# Patient Record
Sex: Male | Born: 1975 | Race: White | Hispanic: No | Marital: Single | Smoking: Never smoker
Health system: Southern US, Community
[De-identification: ages and names within clinical notes are randomized; demographics above are authoritative.]

## PROBLEM LIST (undated history)

## (undated) DIAGNOSIS — I8500 Esophageal varices without bleeding: Secondary | ICD-10-CM

## (undated) DIAGNOSIS — K319 Disease of stomach and duodenum, unspecified: Secondary | ICD-10-CM

## (undated) DIAGNOSIS — K7030 Alcoholic cirrhosis of liver without ascites: Secondary | ICD-10-CM

## (undated) DIAGNOSIS — F1010 Alcohol abuse, uncomplicated: Secondary | ICD-10-CM

## (undated) DIAGNOSIS — E119 Type 2 diabetes mellitus without complications: Secondary | ICD-10-CM

## (undated) DIAGNOSIS — I10 Essential (primary) hypertension: Secondary | ICD-10-CM

---

## 2014-03-19 ENCOUNTER — Encounter (HOSPITAL_COMMUNITY): Payer: Self-pay | Admitting: Emergency Medicine

## 2014-03-19 ENCOUNTER — Emergency Department (HOSPITAL_COMMUNITY): Payer: BC Managed Care – PPO

## 2014-03-19 ENCOUNTER — Inpatient Hospital Stay (HOSPITAL_COMMUNITY)
Admission: EM | Admit: 2014-03-19 | Discharge: 2014-03-21 | DRG: 811 | Disposition: A | Discharge status: Home / Self Care | Payer: BC Managed Care – PPO | Attending: Internal Medicine | Admitting: Internal Medicine

## 2014-03-19 DIAGNOSIS — I129 Hypertensive chronic kidney disease with stage 1 through stage 4 chronic kidney disease, or unspecified chronic kidney disease: Secondary | ICD-10-CM | POA: Diagnosis present

## 2014-03-19 DIAGNOSIS — I8500 Esophageal varices without bleeding: Secondary | ICD-10-CM | POA: Diagnosis present

## 2014-03-19 DIAGNOSIS — D62 Acute posthemorrhagic anemia: Principal | ICD-10-CM | POA: Diagnosis present

## 2014-03-19 DIAGNOSIS — K766 Portal hypertension: Secondary | ICD-10-CM | POA: Diagnosis present

## 2014-03-19 DIAGNOSIS — R7402 Elevation of levels of lactic acid dehydrogenase (LDH): Secondary | ICD-10-CM | POA: Diagnosis present

## 2014-03-19 DIAGNOSIS — D638 Anemia in other chronic diseases classified elsewhere: Secondary | ICD-10-CM

## 2014-03-19 DIAGNOSIS — F1020 Alcohol dependence, uncomplicated: Secondary | ICD-10-CM | POA: Diagnosis present

## 2014-03-19 DIAGNOSIS — N179 Acute kidney failure, unspecified: Secondary | ICD-10-CM | POA: Diagnosis present

## 2014-03-19 DIAGNOSIS — D649 Anemia, unspecified: Secondary | ICD-10-CM | POA: Diagnosis present

## 2014-03-19 DIAGNOSIS — K2990 Gastroduodenitis, unspecified, without bleeding: Secondary | ICD-10-CM

## 2014-03-19 DIAGNOSIS — K7010 Alcoholic hepatitis without ascites: Secondary | ICD-10-CM | POA: Diagnosis present

## 2014-03-19 DIAGNOSIS — K7460 Unspecified cirrhosis of liver: Secondary | ICD-10-CM | POA: Diagnosis present

## 2014-03-19 DIAGNOSIS — K2970 Gastritis, unspecified, without bleeding: Secondary | ICD-10-CM | POA: Diagnosis present

## 2014-03-19 DIAGNOSIS — R7401 Elevation of levels of liver transaminase levels: Secondary | ICD-10-CM | POA: Diagnosis present

## 2014-03-19 DIAGNOSIS — F1010 Alcohol abuse, uncomplicated: Secondary | ICD-10-CM

## 2014-03-19 DIAGNOSIS — K319 Disease of stomach and duodenum, unspecified: Secondary | ICD-10-CM | POA: Diagnosis present

## 2014-03-19 DIAGNOSIS — Z794 Long term (current) use of insulin: Secondary | ICD-10-CM

## 2014-03-19 DIAGNOSIS — E43 Unspecified severe protein-calorie malnutrition: Secondary | ICD-10-CM | POA: Diagnosis present

## 2014-03-19 DIAGNOSIS — I10 Essential (primary) hypertension: Secondary | ICD-10-CM

## 2014-03-19 DIAGNOSIS — E860 Dehydration: Secondary | ICD-10-CM | POA: Diagnosis present

## 2014-03-19 DIAGNOSIS — E119 Type 2 diabetes mellitus without complications: Secondary | ICD-10-CM | POA: Diagnosis present

## 2014-03-19 DIAGNOSIS — D500 Iron deficiency anemia secondary to blood loss (chronic): Secondary | ICD-10-CM

## 2014-03-19 DIAGNOSIS — R740 Nonspecific elevation of levels of transaminase and lactic acid dehydrogenase [LDH]: Secondary | ICD-10-CM

## 2014-03-19 DIAGNOSIS — N189 Chronic kidney disease, unspecified: Secondary | ICD-10-CM | POA: Diagnosis present

## 2014-03-19 DIAGNOSIS — R112 Nausea with vomiting, unspecified: Secondary | ICD-10-CM | POA: Diagnosis not present

## 2014-03-19 DIAGNOSIS — K7030 Alcoholic cirrhosis of liver without ascites: Secondary | ICD-10-CM

## 2014-03-19 DIAGNOSIS — K449 Diaphragmatic hernia without obstruction or gangrene: Secondary | ICD-10-CM | POA: Diagnosis present

## 2014-03-19 DIAGNOSIS — Z79899 Other long term (current) drug therapy: Secondary | ICD-10-CM

## 2014-03-19 DIAGNOSIS — N19 Unspecified kidney failure: Secondary | ICD-10-CM

## 2014-03-19 HISTORY — DX: Type 2 diabetes mellitus without complications: E11.9

## 2014-03-19 HISTORY — DX: Essential (primary) hypertension: I10

## 2014-03-19 LAB — POC OCCULT BLOOD, ED: FECAL OCCULT BLD: POSITIVE — AB

## 2014-03-19 LAB — CBG MONITORING, ED: GLUCOSE-CAPILLARY: 316 mg/dL — AB (ref 70–99)

## 2014-03-19 LAB — RAPID URINE DRUG SCREEN, HOSP PERFORMED
Amphetamines: NOT DETECTED
BARBITURATES: NOT DETECTED
BENZODIAZEPINES: POSITIVE — AB
Cocaine: NOT DETECTED
Opiates: NOT DETECTED
Tetrahydrocannabinol: NOT DETECTED

## 2014-03-19 LAB — COMPREHENSIVE METABOLIC PANEL
ALK PHOS: 230 U/L — AB (ref 39–117)
ALT: 34 U/L (ref 0–53)
AST: 153 U/L — AB (ref 0–37)
Albumin: 3.5 g/dL (ref 3.5–5.2)
Anion gap: 18 — ABNORMAL HIGH (ref 5–15)
BUN: 19 mg/dL (ref 6–23)
CO2: 21 mEq/L (ref 19–32)
Calcium: 9.3 mg/dL (ref 8.4–10.5)
Chloride: 92 mEq/L — ABNORMAL LOW (ref 96–112)
Creatinine, Ser: 1.95 mg/dL — ABNORMAL HIGH (ref 0.50–1.35)
GFR calc non Af Amer: 42 mL/min — ABNORMAL LOW (ref >90)
GFR, EST AFRICAN AMERICAN: 49 mL/min — AB (ref >90)
GLUCOSE: 316 mg/dL — AB (ref 70–99)
Potassium: 4.3 mEq/L (ref 3.7–5.3)
Sodium: 131 mEq/L — ABNORMAL LOW (ref 137–147)
TOTAL PROTEIN: 7.9 g/dL (ref 6.0–8.3)
Total Bilirubin: 2.1 mg/dL — ABNORMAL HIGH (ref 0.3–1.2)

## 2014-03-19 LAB — CBC
HCT: 21.9 % — ABNORMAL LOW (ref 39.0–52.0)
HEMOGLOBIN: 6.9 g/dL — AB (ref 13.0–17.0)
MCH: 29.6 pg (ref 26.0–34.0)
MCHC: 31.5 g/dL (ref 30.0–36.0)
MCV: 94 fL (ref 78.0–100.0)
Platelets: 144 10*3/uL — ABNORMAL LOW (ref 150–400)
RBC: 2.33 MIL/uL — AB (ref 4.22–5.81)
RDW: 15.5 % (ref 11.5–15.5)
WBC: 9.8 10*3/uL (ref 4.0–10.5)

## 2014-03-19 LAB — GLUCOSE, CAPILLARY
GLUCOSE-CAPILLARY: 230 mg/dL — AB (ref 70–99)
Glucose-Capillary: 232 mg/dL — ABNORMAL HIGH (ref 70–99)

## 2014-03-19 LAB — HEMOGLOBIN AND HEMATOCRIT, BLOOD
HCT: 19.7 % — ABNORMAL LOW (ref 39.0–52.0)
Hemoglobin: 6.4 g/dL — CL (ref 13.0–17.0)

## 2014-03-19 LAB — LIPASE, BLOOD: LIPASE: 90 U/L — AB (ref 11–59)

## 2014-03-19 LAB — ABO/RH: ABO/RH(D): O POS

## 2014-03-19 LAB — PREPARE RBC (CROSSMATCH)

## 2014-03-19 LAB — ETHANOL

## 2014-03-19 MED ORDER — LORAZEPAM 1 MG PO TABS: 1.0000 mg | ORAL_TABLET | Freq: Four times a day (QID) | ORAL | Status: DC | PRN

## 2014-03-19 MED ORDER — VITAMIN B-1 100 MG PO TABS
100.0000 mg | ORAL_TABLET | Freq: Every day | ORAL | Status: DC
  Administered 2014-03-19 – 2014-03-21 (×3): 100 mg via ORAL
  Filled 2014-03-19 (×3): qty 1

## 2014-03-19 MED ORDER — LORAZEPAM 1 MG PO TABS
1.0000 mg | ORAL_TABLET | Freq: Four times a day (QID) | ORAL | Status: DC | PRN
Start: 2014-03-19 — End: 2014-03-21

## 2014-03-19 MED ORDER — INSULIN ASPART 100 UNIT/ML ~~LOC~~ SOLN
0.0000 [IU] | SUBCUTANEOUS | Status: DC
  Administered 2014-03-20: 3 [IU] via SUBCUTANEOUS
  Administered 2014-03-20: 5 [IU] via SUBCUTANEOUS
  Administered 2014-03-20 – 2014-03-21 (×4): 3 [IU] via SUBCUTANEOUS

## 2014-03-19 MED ORDER — FUROSEMIDE 10 MG/ML IJ SOLN
20.0000 mg | Freq: Once | INTRAMUSCULAR | Status: AC
  Administered 2014-03-20: 20 mg via INTRAVENOUS
  Filled 2014-03-19: qty 2

## 2014-03-19 MED ORDER — METFORMIN HCL 500 MG PO TABS: 500.0000 mg | ORAL_TABLET | Freq: Two times a day (BID) | ORAL | Status: DC

## 2014-03-19 MED ORDER — VITAMIN D (ERGOCALCIFEROL) 1.25 MG (50000 UNIT) PO CAPS: 50000.0000 [IU] | ORAL_CAPSULE | ORAL | Status: DC

## 2014-03-19 MED ORDER — ONDANSETRON HCL 4 MG/2ML IJ SOLN: 4.0000 mg | Freq: Four times a day (QID) | INTRAMUSCULAR | Status: DC | PRN

## 2014-03-19 MED ORDER — FOLIC ACID 1 MG PO TABS
1.0000 mg | ORAL_TABLET | Freq: Every day | ORAL | Status: DC
  Administered 2014-03-19 – 2014-03-21 (×3): 1 mg via ORAL
  Filled 2014-03-19 (×3): qty 1

## 2014-03-19 MED ORDER — LORAZEPAM 2 MG/ML IJ SOLN: 0.0000 mg | Freq: Two times a day (BID) | INTRAMUSCULAR | Status: DC

## 2014-03-19 MED ORDER — ALBUTEROL SULFATE (2.5 MG/3ML) 0.083% IN NEBU: 2.5000 mg | INHALATION_SOLUTION | Freq: Four times a day (QID) | RESPIRATORY_TRACT | Status: DC | PRN

## 2014-03-19 MED ORDER — SODIUM CHLORIDE 0.9 % IJ SOLN: 3.0000 mL | Freq: Two times a day (BID) | INTRAMUSCULAR | Status: DC

## 2014-03-19 MED ORDER — SODIUM CHLORIDE 0.9 % IV BOLUS (SEPSIS)
1000.0000 mL | Freq: Once | INTRAVENOUS | Status: AC
  Administered 2014-03-19: 1000 mL via INTRAVENOUS

## 2014-03-19 MED ORDER — MAGNESIUM OXIDE 400 (241.3 MG) MG PO TABS
400.0000 mg | ORAL_TABLET | Freq: Two times a day (BID) | ORAL | Status: DC
  Administered 2014-03-19 – 2014-03-21 (×4): 400 mg via ORAL
  Filled 2014-03-19 (×5): qty 1

## 2014-03-19 MED ORDER — ALBUTEROL SULFATE HFA 108 (90 BASE) MCG/ACT IN AERS: 1.0000 | INHALATION_SPRAY | Freq: Four times a day (QID) | RESPIRATORY_TRACT | Status: DC | PRN

## 2014-03-19 MED ORDER — INSULIN GLARGINE 100 UNIT/ML ~~LOC~~ SOLN
15.0000 [IU] | Freq: Every day | SUBCUTANEOUS | Status: DC
  Filled 2014-03-19 (×2): qty 0.15

## 2014-03-19 MED ORDER — LORAZEPAM 2 MG/ML IJ SOLN: 1.0000 mg | Freq: Four times a day (QID) | INTRAMUSCULAR | Status: DC | PRN

## 2014-03-19 MED ORDER — ONDANSETRON HCL 4 MG PO TABS: 4.0000 mg | ORAL_TABLET | Freq: Four times a day (QID) | ORAL | Status: DC | PRN

## 2014-03-19 MED ORDER — LOSARTAN POTASSIUM 50 MG PO TABS
50.0000 mg | ORAL_TABLET | Freq: Every day | ORAL | Status: DC
  Filled 2014-03-19: qty 1

## 2014-03-19 MED ORDER — ADULT MULTIVITAMIN W/MINERALS CH
1.0000 | ORAL_TABLET | Freq: Every day | ORAL | Status: DC
  Administered 2014-03-19 – 2014-03-21 (×3): 1 via ORAL
  Filled 2014-03-19 (×3): qty 1

## 2014-03-19 MED ORDER — THIAMINE HCL 100 MG/ML IJ SOLN
100.0000 mg | Freq: Every day | INTRAMUSCULAR | Status: DC
  Filled 2014-03-19 (×3): qty 1

## 2014-03-19 MED ORDER — LORAZEPAM 2 MG/ML IJ SOLN: 0.0000 mg | Freq: Four times a day (QID) | INTRAMUSCULAR | Status: DC

## 2014-03-19 MED ORDER — PANTOPRAZOLE SODIUM 40 MG IV SOLR
40.0000 mg | Freq: Once | INTRAVENOUS | Status: AC
  Administered 2014-03-19: 40 mg via INTRAVENOUS
  Filled 2014-03-19: qty 40

## 2014-03-19 MED ORDER — ONDANSETRON HCL 4 MG/2ML IJ SOLN: 4.0000 mg | Freq: Once | INTRAMUSCULAR | Status: DC

## 2014-03-19 MED ORDER — SODIUM CHLORIDE 0.9 % IV SOLN
INTRAVENOUS | Status: DC
  Administered 2014-03-19: 22:00:00 via INTRAVENOUS
  Administered 2014-03-20: 500 mL via INTRAVENOUS
  Administered 2014-03-20 – 2014-03-21 (×2): via INTRAVENOUS

## 2014-03-19 NOTE — ED Notes (Signed)
MD aware CBG 316.

## 2014-03-19 NOTE — H&P (Signed)
[facility] History and Physical  [name] ZOX:[id number] DOB: 04-29-1976 DOA: 03/19/2014  Referring physician: Tresa Res, MD PCP: No PCP Per Patient   Chief Complaint: Anemia  HPI: Jerome [name] is a 38 y.o. male with chronic alcohol abuse [facility]. Patient states that he had been again on a drinking binge and decided that he wanted to go in for detox. Patient states that [facility] and was found to have a low hemoglobin. Patient states that his last drink was yesterday and he normally was drinking about a pint of Vodka daily. Patient states that he has had some diarrhea also. He states that his stool was dark but not frankly bloody. Patient noted that he had no hematemesis noted. He states in the past he has had a Endoscopy done and this has shown gastritis as well as esophageal varices. Patient states that he has noted some increased abdominal girth also.   Review of Systems:  Constitutional:  No weight loss, night sweats, Fevers HEENT:  No headaches, Difficulty swallowing,nasal congestion, post nasal drip,  Cardio-vascular:  No chest pain, Orthopnea, PND, swelling in lower extremities, dizziness, palpitations  GI:  No heartburn, indigestion, abdominal pain, nausea, vomiting, ++diarrhea, no change in bowel habits, loss of appetite  Resp:  No shortness of breath with exertion or at rest. No excess mucus, no productive cough, No non-productive cough Skin:  no rash or lesions.  GU:  no dysuria, change in color of urine, no urgency or frequency. No flank pain.  Musculoskeletal:  No joint pain or swelling. No decreased range of motion. No back pain.  Psych:  No change in mood or affect. No depression or anxiety. No memory loss.   Past Medical History  Diagnosis Date  . Diabetes mellitus without complication   . Hypertension    History reviewed. No pertinent past surgical history. Social History:  reports that he has  never smoked. He does not have any smokeless tobacco history on file. He reports that he drinks alcohol. He reports that he does not use illicit drugs.  No Known Allergies  No family history on file.   Prior to Admission medications   Medication Sig Start Date End Date Taking? Authorizing Provider  albuterol (PROVENTIL HFA;VENTOLIN HFA) 108 (90 BASE) MCG/ACT inhaler Inhale 1-2 puffs into the lungs every 6 (six) hours as needed for wheezing or shortness of breath (shortness of breath).   Yes Historical Provider, MD  insulin glargine (LANTUS) 100 UNIT/ML injection Inject 15 Units into the skin at bedtime.   Yes Historical Provider, MD  LORazepam (ATIVAN) 1 MG tablet Take 1 mg by mouth every 6 (six) hours as needed for anxiety (anxiety).   Yes Historical Provider, MD  losartan (COZAAR) 50 MG tablet Take 50 mg by mouth daily.   Yes Historical Provider, MD  magnesium oxide (MAG-OX) 400 MG tablet Take 400 mg by mouth 2 (two) times daily.   Yes Historical Provider, MD  metFORMIN (GLUCOPHAGE) 500 MG tablet Take 500 mg by mouth 2 (two) times daily with a meal.   Yes Historical Provider, MD  Vitamin D, Ergocalciferol, (DRISDOL) 50000 UNITS CAPS capsule Take 50,000 Units by mouth every 7 (seven) days. Wednesday   Yes Historical Provider, MD   Physical Exam: Filed Vitals:   03/19/14 1800 03/19/14 1830 03/19/14 1900 03/19/14 1937  BP: 111/49 122/36 110/51 112/47  Pulse: 103 98 103 106  Temp:    98.4 F (36.9 C)  TempSrc:  Oral  Resp:    19  SpO2: 96% 98% 100% 99%    Wt Readings from Last 3 Encounters:  No data found for Wt    General:  Appears calm and comfortable Eyes: PERRL, normal lids, irises & conjunctiva ENT: grossly normal hearing, lips & tongue Neck: no LAD, masses or thyromegaly Cardiovascular: RRR, no m/r/g. No LE edema. Respiratory: CTA bilaterally, no w/r/r. Normal respiratory effort. Abdomen: soft, distended probable ascites ++veins on abdominal and chest wall Skin: spider  naevi noted Musculoskeletal: grossly normal tone BUE/BLE Psychiatric: grossly normal mood and affect, speech fluent and appropriate Neurologic: grossly non-focal.          Labs on Admission:  Basic Metabolic Panel:  Recent Labs Lab 03/19/14 1612  NA 131*  K 4.3  CL 92*  CO2 21  GLUCOSE 316*  BUN 19  CREATININE 1.95*  CALCIUM 9.3   Liver Function Tests:  Recent Labs Lab 03/19/14 1612  AST 153*  ALT 34  ALKPHOS 230*  BILITOT 2.1*  PROT 7.9  ALBUMIN 3.5    Recent Labs Lab 03/19/14 1744  LIPASE 90*   No results found for this basename: AMMONIA,  in the last 168 hours CBC:  Recent Labs Lab 03/19/14 1612  WBC 9.8  HGB 6.9*  HCT 21.9*  MCV 94.0  PLT 144*   Cardiac Enzymes: No results found for this basename: CKTOTAL, CKMB, CKMBINDEX, TROPONINI,  in the last 168 hours  BNP (last 3 results) No results found for this basename: PROBNP,  in the last 8760 hours CBG:  Recent Labs Lab 03/19/14 1708  GLUCAP 316*    Radiological Exams on Admission: US Abdomen Complete  03/19/2014   CLINICAL DATA:  Abnormal hepatic function studies ; history of alcohol abuse  EXAM: ULTRASOUND ABDOMEN COMPLETE  COMPARISON:  None.  FINDINGS: Gallbladder:  The gallbladder is contracted and contains echogenic material consistent with sludge. The patient's most recent meal was approximately 6 hr ago. There is no positive sonographic Murphy's sign. There is no gallbladder wall thickening or pericholecystic fluid.  Common bile duct:  Diameter: 5 mm  Liver:  The echotexture of the liver is mildly increased. There is no focal mass or ductal dilation.  IVC:  No abnormality visualized.  Pancreas:  Visualized portion unremarkable.  Spleen:  There is mild splenomegaly. The splenic volume is approximately 660 cc  Right Kidney:  Length: 12.5 cm. Echogenicity within normal limits. No mass or hydronephrosis visualized.  Left Kidney:  Length: 12.9 cm. Echogenicity within normal limits. No mass or  hydronephrosis visualized.  Abdominal aorta:  Bowel gas obscured the abdominal aorta with exception of the proximal portion which measured 1.8 cm in diameter.  Other findings:  There is no ascites.  IMPRESSION: 1. The gallbladder is contracted and contains sludge but no discrete stones. There is no sonographic evidence of acute cholecystitis. Chronic cholecystitis is not excluded. 2. There is likely fatty infiltrative change of the liver. 3. There is splenomegaly which may be related to patent to hepatocellular dysfunction.   Electronically Signed   By: David  [place]   On: 03/19/2014 19:10     Assessment/Plan Principal Problem:   Alcohol abuse Active Problems:   Diabetes mellitus   Hypertension   Anemia   1. Alcohol Abuse recurring episodes -had a lengthy discussion with the patient regarding his ongoing alcohol abuse -he states that he is finally ready to quit and detox -will monitor for DT withdrawal as he states he has had this in  the past -last drink was yesterday  2. Anemia with history of Esophageal varices -obvious concern is if there is a potential for opening up a variceal bleed -he has had gastritis before and likely is chronic to explain his anemia -type and screen he is hemodynamically stable presently -GI consult ordered -will get iron studies  3. Diabetes Mellitus -will continue with his insulin -monitor CBG -check A1C  4. Hypertension -continue with home medications  5. Cirrhosis of the liver -appears to be advance with clinical portal hypertension signs -GI consult -abdominal ultrasound for possible ascites  6. Elevated creatinine -likely dehydrated -will repeat in am -hydrate as tolerated  7. Transaminitis -likely related to cirrhosis and active drinking -will monitor labs -hepatitis panel   Code Status: Full Code (must indicate code status--if unknown or must be presumed, indicate so) DVT Prophylaxis:SCDs Family Communication: None (indicate  person spoken with, if applicable, with phone number if by telephone) Disposition Plan: Home (indicate anticipated LOS)  Time spent: 70min  [facility] HospitalKHAN,Hykeem Ojeda A [facility] Pager [phone]  **Disclaimer: This note may have been dictated with voice recognition software. Similar sounding words can inadvertently be transcribed and this note may contain transcription errors which may not have been corrected upon publication of note.**

## 2014-03-19 NOTE — ED Notes (Signed)
Pt. Unable to urinate at this time. Will collect when pt. Is ready. Nurse was notified.

## 2014-03-19 NOTE — Progress Notes (Signed)
Pt transported to 4th floor via stretcher and ambulated to bed with little assistance. Belongings at bedside. VSS at this time. No distress noted. Will continue to monitor pt closely. Mardene CelesteAsaro, Reshad Saab I

## 2014-03-19 NOTE — ED Notes (Signed)
[facility] In[facility] Fermoina[facility]. Aware of patient's admission to hospital. Will bring belongings to hospital for pt. Pt also aware.

## 2014-03-19 NOTE — ED Notes (Addendum)
Pt reports [facility] that shows Hgb 6.7, RBC 2.27. St[facility], Hgb same then, no treatment performed. Reports stool is dark but no frank blood x24 hours. +diarrhea. [facility], normally drinks pint to a fifth of liquor daily. Last drink yesterday noon.

## 2014-03-19 NOTE — ED Provider Notes (Signed)
CSN: [id number]     Arrival date & time 03/19/14  1511 History   First MD Initiated Contact with Patient 03/19/14 1704     Chief Complaint  Patient presents with  . Anemia  . Diarrhea     (Consider location/radiation/quality/duration/timing/severity/associated sxs/prior Treatment) HPI  This is a 3 are old male with a history of alcohol abuse, diabetes, and hypertension who presents from [facility] with a low hemoglobin.  Patient reports that he presented there yesterday for alcohol detox. He drinks approximately 1 pint of liquor per day. Last drink was yesterday at noon. Patient states that since that time he has had diarrhea. He denies any blood. Basic labwork on him this morning and noted him to have a hemoglobin of 6.7. Patient states that he was told recently he did have a low hemoglobin but "my workup was negative."  He denies any dizziness or shortness of breath. Currently he is only complaining of nausea and diarrhea. He feels these are related to withdrawal.  Past Medical History  Diagnosis Date  . Diabetes mellitus without complication   . Hypertension    History reviewed. No pertinent past surgical history. No family history on file. History  Substance Use Topics  . Smoking status: Never Smoker   . Smokeless tobacco: Not on file  . Alcohol Use: Yes     Comment: pint daily-in rehab as of yesterday (03/18/14)    Review of Systems  Constitutional: Negative.  Negative for fever.  Respiratory: Negative.  Negative for chest tightness and shortness of breath.   Cardiovascular: Negative.  Negative for chest pain.  Gastrointestinal: Positive for nausea and diarrhea. Negative for vomiting, abdominal pain and constipation.  Genitourinary: Negative.  Negative for dysuria.  Musculoskeletal: Negative for back pain.  Skin: Negative for rash.  Neurological: Negative for dizziness, light-headedness and headaches.  All other systems reviewed and are negative.     Allergies   Review of patient's allergies indicates no known allergies.  Home Medications   Prior to Admission medications   Medication Sig Start Date End Date Taking? Authorizing Provider  albuterol (PROVENTIL HFA;VENTOLIN HFA) 108 (90 BASE) MCG/ACT inhaler Inhale 1-2 puffs into the lungs every 6 (six) hours as needed for wheezing or shortness of breath (shortness of breath).   Yes Historical Provider, MD  insulin glargine (LANTUS) 100 UNIT/ML injection Inject 15 Units into the skin at bedtime.   Yes Historical Provider, MD  LORazepam (ATIVAN) 1 MG tablet Take 1 mg by mouth every 6 (six) hours as needed for anxiety (anxiety).   Yes Historical Provider, MD  losartan (COZAAR) 50 MG tablet Take 50 mg by mouth daily.   Yes Historical Provider, MD  magnesium oxide (MAG-OX) 400 MG tablet Take 400 mg by mouth 2 (two) times daily.   Yes Historical Provider, MD  metFORMIN (GLUCOPHAGE) 500 MG tablet Take 500 mg by mouth 2 (two) times daily with a meal.   Yes Historical Provider, MD  Vitamin D, Ergocalciferol, (DRISDOL) 50000 UNITS CAPS capsule Take 50,000 Units by mouth every 7 (seven) days. Wednesday   Yes Historical Provider, MD   BP 112/38  Pulse 101  Temp(Src) 98.4 F (36.9 C) (Oral)  Resp 14  SpO2 97% Physical Exam  Nursing note and vitals reviewed. Constitutional: He is oriented to person, place, and time. He appears well-developed and well-nourished. No distress.  HENT:  Head: Normocephalic and atraumatic.  Eyes: Pupils are equal, round, and reactive to light.  Neck: Neck supple.  Cardiovascular: Regular rhythm  and normal heart sounds.   No murmur heard. Tachycardia  Pulmonary/Chest: Effort normal and breath sounds normal. No respiratory distress. He has no wheezes.  Abdominal: Soft. Bowel sounds are normal. There is no tenderness. There is no rebound.  Musculoskeletal: He exhibits no edema.  Lymphadenopathy:    He has no cervical adenopathy.  Neurological: He is alert and oriented to person,  place, and time.  Skin:  Abrasion on chin  Psychiatric: He has a normal mood and affect.    ED Course  Procedures (including critical care time) Labs Review Labs Reviewed  CBC - Abnormal; Notable for the following:    RBC 2.33 (*)    Hemoglobin 6.9 (*)    HCT 21.9 (*)    Platelets 144 (*)    All other components within normal limits  COMPREHENSIVE METABOLIC PANEL - Abnormal; Notable for the following:    Sodium 131 (*)    Chloride 92 (*)    Glucose, Bld 316 (*)    Creatinine, Ser 1.95 (*)    AST 153 (*)    Alkaline Phosphatase 230 (*)    Total Bilirubin 2.1 (*)    GFR calc non Af Amer 42 (*)    GFR calc Af Amer 49 (*)    Anion gap 18 (*)    All other components within normal limits  URINE RAPID DRUG SCREEN (HOSP PERFORMED) - Abnormal; Notable for the following:    Benzodiazepines POSITIVE (*)    All other components within normal limits  LIPASE, BLOOD - Abnormal; Notable for the following:    Lipase 90 (*)    All other components within normal limits  POC OCCULT BLOOD, ED - Abnormal; Notable for the following:    Fecal Occult Bld POSITIVE (*)    All other components within normal limits  CBG MONITORING, ED - Abnormal; Notable for the following:    Glucose-Capillary 316 (*)    All other components within normal limits  ETHANOL  TYPE AND SCREEN  ABO/RH    Imaging Review US Abdomen Complete  03/19/2014   CLINICAL DATA:  Abnormal hepatic function studies ; history of alcohol abuse  EXAM: ULTRASOUND ABDOMEN COMPLETE  COMPARISON:  None.  FINDINGS: Gallbladder:  The gallbladder is contracted and contains echogenic material consistent with sludge. The patient's most recent meal was approximately 6 hr ago. There is no positive sonographic Murphy's sign. There is no gallbladder wall thickening or pericholecystic fluid.  Common bile duct:  Diameter: 5 mm  Liver:  The echotexture of the liver is mildly increased. There is no focal mass or ductal dilation.  IVC:  No abnormality  visualized.  Pancreas:  Visualized portion unremarkable.  Spleen:  There is mild splenomegaly. The splenic volume is approximately 660 cc  Right Kidney:  Length: 12.5 cm. Echogenicity within normal limits. No mass or hydronephrosis visualized.  Left Kidney:  Length: 12.9 cm. Echogenicity within normal limits. No mass or hydronephrosis visualized.  Abdominal aorta:  Bowel gas obscured the abdominal aorta with exception of the proximal portion which measured 1.8 cm in diameter.  Other findings:  There is no ascites.  IMPRESSION: 1. The gallbladder is contracted and contains sludge but no discrete stones. There is no sonographic evidence of acute cholecystitis. Chronic cholecystitis is not excluded. 2. There is likely fatty infiltrative change of the liver. 3. There is splenomegaly which may be related to patent to hepatocellular dysfunction.   Electronically Signed   By: David  [place]   On: 03/19/2014 19:10  EKG Interpretation   Date/Time:  Thursday March 19 2014 19:08:15 EDT Ventricular Rate:  99 PR Interval:  160 QRS Duration: 99 QT Interval:  334 QTC Calculation: 429 R Axis:   63 Text Interpretation:  Sinus rhythm Borderline T abnormalities, inferior  leads Baseline wander in lead(s) V3 Confirmed by Islam Villescas  MD, Rachard Isidro  ([id number]) on 03/19/2014 7:14:26 PM      MDM   Final diagnoses:  Anemia, chronic disease  Renal failure  Transaminitis  Dehydration  Alcohol abuse    Patient presents with anemia from rehabilitation. Noted to have a pulse of 104 and a blood pressure 102/40.  Nontoxic on exam. Patient was given fluids and labs were repeated. Multiple lab abnormalities including a hemoglobin of 6.9, platelets 144, glucose 316, transaminitis, and a creatinine of 1.95 with a baseline. Patient denies any symptoms from anemia; however, orthostatics are positive. Patient was aggressively fluid hydrated. He was placed on CIWA protocol. Hemoccult positive. Suspect anemia is acute on chronic given  history of recently noted anemia and chronic alcohol abuse. Patient was given IV Protonix. He was typed and screened but at this time I will not transfuse given that his hematocrit is greater than 21 he has no history of heart disease.  He will be admitted for further workup and management. Discussed with Dr. Welton FlakesKhan and [facility] will see the patient in consult.   Shon Batonourtney F Oaklan Persons, MD 03/19/14 2104

## 2014-03-19 NOTE — Progress Notes (Signed)
PHARMACIST - PHYSICIAN COMMUNICATION DR:  Welton FlakesKhan CONCERNING:  METFORMIN SAFE ADMINISTRATION POLICY  RECOMMENDATION: Metformin has been placed on DISCONTINUE (rejected order) STATUS and should be reordered only after any of the conditions below are ruled out.  Current safety recommendations include avoiding metformin for a minimum of 48 hours after the patient's exposure to intravenous contrast media.  DESCRIPTION:  The Pharmacy Committee has adopted a policy that restricts the use of metformin in hospitalized patients until all the contraindications to administration have been ruled out. Specific contraindications are: [x]  Serum creatinine ? 1.5 for males []  Serum creatinine ? 1.4 for females []  Shock, acute MI, sepsis, hypoxemia, dehydration []  Planned administration of intravenous iodinated contrast media []  Heart Failure patients with low EF []  Acute or chronic metabolic acidosis (including DKA)      Jerome KnowsJustin M Shekelia [name], PharmD, BCPS Pager [phone] 03/19/2014 9:33 PM

## 2014-03-20 ENCOUNTER — Encounter (HOSPITAL_COMMUNITY): Payer: Self-pay

## 2014-03-20 ENCOUNTER — Encounter (HOSPITAL_COMMUNITY)
Admission: EM | Disposition: A | Discharge status: Home / Self Care | Payer: Self-pay | Source: Home / Self Care | Attending: Internal Medicine

## 2014-03-20 DIAGNOSIS — I10 Essential (primary) hypertension: Secondary | ICD-10-CM

## 2014-03-20 DIAGNOSIS — F1010 Alcohol abuse, uncomplicated: Secondary | ICD-10-CM

## 2014-03-20 DIAGNOSIS — K7030 Alcoholic cirrhosis of liver without ascites: Secondary | ICD-10-CM

## 2014-03-20 DIAGNOSIS — D62 Acute posthemorrhagic anemia: Principal | ICD-10-CM

## 2014-03-20 HISTORY — PX: ESOPHAGOGASTRODUODENOSCOPY: SHX5428

## 2014-03-20 LAB — COMPREHENSIVE METABOLIC PANEL
ALT: 29 U/L (ref 0–53)
AST: 151 U/L — ABNORMAL HIGH (ref 0–37)
Albumin: 2.9 g/dL — ABNORMAL LOW (ref 3.5–5.2)
Alkaline Phosphatase: 184 U/L — ABNORMAL HIGH (ref 39–117)
Anion gap: 16 — ABNORMAL HIGH (ref 5–15)
BUN: 23 mg/dL (ref 6–23)
CHLORIDE: 97 meq/L (ref 96–112)
CO2: 20 meq/L (ref 19–32)
Calcium: 8.3 mg/dL — ABNORMAL LOW (ref 8.4–10.5)
Creatinine, Ser: 2.12 mg/dL — ABNORMAL HIGH (ref 0.50–1.35)
GFR calc Af Amer: 45 mL/min — ABNORMAL LOW (ref >90)
GFR, EST NON AFRICAN AMERICAN: 38 mL/min — AB (ref >90)
GLUCOSE: 222 mg/dL — AB (ref 70–99)
POTASSIUM: 4.3 meq/L (ref 3.7–5.3)
SODIUM: 133 meq/L — AB (ref 137–147)
TOTAL PROTEIN: 6.9 g/dL (ref 6.0–8.3)
Total Bilirubin: 1.9 mg/dL — ABNORMAL HIGH (ref 0.3–1.2)

## 2014-03-20 LAB — GLUCOSE, CAPILLARY
GLUCOSE-CAPILLARY: 209 mg/dL — AB (ref 70–99)
Glucose-Capillary: 226 mg/dL — ABNORMAL HIGH (ref 70–99)
Glucose-Capillary: 198 mg/dL — ABNORMAL HIGH (ref 70–99)
Glucose-Capillary: 177 mg/dL — ABNORMAL HIGH (ref 70–99)

## 2014-03-20 LAB — CBC
HCT: 20.7 % — ABNORMAL LOW (ref 39.0–52.0)
HEMOGLOBIN: 6.9 g/dL — AB (ref 13.0–17.0)
MCH: 30.1 pg (ref 26.0–34.0)
MCHC: 33.3 g/dL (ref 30.0–36.0)
MCV: 90.4 fL (ref 78.0–100.0)
Platelets: 113 10*3/uL — ABNORMAL LOW (ref 150–400)
RBC: 2.29 MIL/uL — AB (ref 4.22–5.81)
RDW: 16.8 % — ABNORMAL HIGH (ref 11.5–15.5)
WBC: 7.8 10*3/uL (ref 4.0–10.5)

## 2014-03-20 LAB — HEMOGLOBIN A1C
Hgb A1c MFr Bld: 11.2 % — ABNORMAL HIGH (ref <5.7)
MEAN PLASMA GLUCOSE: 275 mg/dL — AB (ref <117)

## 2014-03-20 LAB — TSH: TSH: 2.73 u[IU]/mL (ref 0.350–4.500)

## 2014-03-20 LAB — PROTIME-INR
INR: 1.32 (ref 0.00–1.49)
Prothrombin Time: 16.4 seconds — ABNORMAL HIGH (ref 11.6–15.2)

## 2014-03-20 LAB — HEPATITIS PANEL, ACUTE
HCV Ab: NEGATIVE
Hep A IgM: NONREACTIVE
Hep B C IgM: NONREACTIVE
Hepatitis B Surface Ag: NEGATIVE

## 2014-03-20 LAB — IRON AND TIBC
Iron: <10 ug/dL — ABNORMAL LOW (ref 42–135)
UIBC: 301 ug/dL (ref 125–400)

## 2014-03-20 LAB — HEMOGLOBIN AND HEMATOCRIT, BLOOD
HCT: 23.9 % — ABNORMAL LOW (ref 39.0–52.0)
HEMOGLOBIN: 8 g/dL — AB (ref 13.0–17.0)

## 2014-03-20 LAB — VITAMIN B12: Vitamin B-12: 749 pg/mL (ref 211–911)

## 2014-03-20 LAB — FOLATE RBC: RBC Folate: 1273 ng/mL — ABNORMAL HIGH (ref >280)

## 2014-03-20 LAB — CLOSTRIDIUM DIFFICILE BY PCR: Toxigenic C. Difficile by PCR: NEGATIVE

## 2014-03-20 LAB — PREPARE RBC (CROSSMATCH)

## 2014-03-20 SURGERY — EGD (ESOPHAGOGASTRODUODENOSCOPY)
Anesthesia: Moderate Sedation

## 2014-03-20 MED ORDER — DIPHENHYDRAMINE HCL 50 MG/ML IJ SOLN
INTRAMUSCULAR | Status: AC
  Filled 2014-03-20: qty 1

## 2014-03-20 MED ORDER — BUTAMBEN-TETRACAINE-BENZOCAINE 2-2-14 % EX AERO
INHALATION_SPRAY | CUTANEOUS | Status: DC | PRN
Start: 2014-03-20 — End: 2014-03-20
  Administered 2014-03-20: 2 via TOPICAL

## 2014-03-20 MED ORDER — MIDAZOLAM HCL 10 MG/2ML IJ SOLN
INTRAMUSCULAR | Status: AC
  Filled 2014-03-20: qty 2

## 2014-03-20 MED ORDER — FENTANYL CITRATE 0.05 MG/ML IJ SOLN
INTRAMUSCULAR | Status: DC | PRN
  Administered 2014-03-20 (×4): 25 ug via INTRAVENOUS

## 2014-03-20 MED ORDER — PANTOPRAZOLE SODIUM 40 MG IV SOLR
40.0000 mg | Freq: Every day | INTRAVENOUS | Status: DC
  Administered 2014-03-20: 40 mg via INTRAVENOUS
  Filled 2014-03-20: qty 40

## 2014-03-20 MED ORDER — SODIUM CHLORIDE 0.9 % IV SOLN
25.0000 ug/h | INTRAVENOUS | Status: DC
  Administered 2014-03-20 – 2014-03-21 (×2): 25 ug/h via INTRAVENOUS
  Filled 2014-03-20 (×3): qty 1

## 2014-03-20 MED ORDER — MIDAZOLAM HCL 10 MG/2ML IJ SOLN
INTRAMUSCULAR | Status: DC | PRN
  Administered 2014-03-20 (×3): 2 mg via INTRAVENOUS
  Administered 2014-03-20: 1 mg via INTRAVENOUS

## 2014-03-20 MED ORDER — OCTREOTIDE LOAD VIA INFUSION
50.0000 ug | Freq: Once | INTRAVENOUS | Status: AC
  Administered 2014-03-20: 50 ug via INTRAVENOUS
  Filled 2014-03-20: qty 25

## 2014-03-20 MED ORDER — PANTOPRAZOLE SODIUM 40 MG IV SOLR
40.0000 mg | Freq: Two times a day (BID) | INTRAVENOUS | Status: DC
  Administered 2014-03-20 – 2014-03-21 (×2): 40 mg via INTRAVENOUS
  Filled 2014-03-20 (×4): qty 40

## 2014-03-20 MED ORDER — DIPHENHYDRAMINE HCL 50 MG/ML IJ SOLN
INTRAMUSCULAR | Status: DC | PRN
  Administered 2014-03-20 (×2): 25 mg via INTRAVENOUS

## 2014-03-20 MED ORDER — FENTANYL CITRATE 0.05 MG/ML IJ SOLN
INTRAMUSCULAR | Status: AC
  Filled 2014-03-20: qty 2

## 2014-03-20 NOTE — Consult Note (Signed)
Referring Provider: Dr. York SpanielBuriev Primary Care Physician:  No PCP Per Patient Primary Gastroenterologist:  Gentry FitzUnassigned  Reason for Consultation:  Anemia  HPI: Jerome [name] is a 38 y.o. male with history of alcohol abuse and cirrhosis (history of esophageal varices and gastritis on an E[place] about a [place] ago [place] - records not available to me at this time). He was sent over from detox because of a low Hgb. Reports that his Hgb was 6 about 2 weeks ago when it was checked. Drinks a pint of Vodka daily and has drunk alcohol heavily for years. Denies abdominal pain, N/V/hematemesis/hematochezia/melena. Reports watery stools when he drinks heavily and had the diarrhea 2 days in the past week. Stools have been dark brown and denies that they have been black or red. Denies dizziness or lightheadedness. Hgb 6.9 on admit to hospital. Heme positive.     Past Medical History  Diagnosis Date  . Diabetes mellitus without complication   . Hypertension     History reviewed. No pertinent past surgical history.  Prior to Admission medications   Medication Sig Start Date End Date Taking? Authorizing Provider  albuterol (PROVENTIL HFA;VENTOLIN HFA) 108 (90 BASE) MCG/ACT inhaler Inhale 1-2 puffs into the lungs every 6 (six) hours as needed for wheezing or shortness of breath (shortness of breath).   Yes Historical Provider, MD  insulin glargine (LANTUS) 100 UNIT/ML injection Inject 15 Units into the skin at bedtime.   Yes Historical Provider, MD  LORazepam (ATIVAN) 1 MG tablet Take 1 mg by mouth every 6 (six) hours as needed for anxiety (anxiety).   Yes Historical Provider, MD  losartan (COZAAR) 50 MG tablet Take 50 mg by mouth daily.   Yes Historical Provider, MD  magnesium oxide (MAG-OX) 400 MG tablet Take 400 mg by mouth 2 (two) times daily.   Yes Historical Provider, MD  metFORMIN (GLUCOPHAGE) 500 MG tablet Take 500 mg by mouth 2 (two) times daily with a meal.   Yes Historical Provider, MD   Vitamin D, Ergocalciferol, (DRISDOL) 50000 UNITS CAPS capsule Take 50,000 Units by mouth every 7 (seven) days. Wednesday   Yes Historical Provider, MD    Scheduled Meds: . folic acid  1 mg Oral Daily  . insulin aspart  0-15 Units Subcutaneous 6 times per day  . insulin glargine  15 Units Subcutaneous QHS  . LORazepam  0-4 mg Intravenous 4 times per day   Followed by  . [START ON 03/21/2014] LORazepam  0-4 mg Intravenous Q12H  . losartan  50 mg Oral Daily  . magnesium oxide  400 mg Oral BID  . multivitamin with minerals  1 tablet Oral Daily  . ondansetron (ZOFRAN) IV  4 mg Intravenous Once  . pantoprazole (PROTONIX) IV  40 mg Intravenous Daily  . sodium chloride  3 mL Intravenous Q12H  . thiamine  100 mg Oral Daily   Or  . thiamine  100 mg Intravenous Daily  . [START ON 03/25/2014] Vitamin D (Ergocalciferol)  50,000 Units Oral Q7 days   Continuous Infusions: . sodium chloride 75 mL/hr at 03/19/14 2153   PRN Meds:.albuterol, LORazepam, LORazepam, ondansetron (ZOFRAN) IV, ondansetron  Allergies as of 03/19/2014  . (No Known Allergies)    No family history on file.  History   Social History  . Marital Status: Single    Spouse Name: N/A    Number of Children: N/A  . Years of Education: N/A   Occupational History  . Not on file.   Social  History Main Topics  . Smoking status: Never Smoker   . Smokeless tobacco: Not on file  . Alcohol Use: Yes     Comment: pint daily-in rehab as of yesterday (03/18/14)  . Drug Use: No  . Sexual Activity: Not on file   Other Topics Concern  . Not on file   Social History Narrative  . No narrative on file    Review of Systems: All negative except as stated above in HPI.  Physical Exam: Vital signs: Filed Vitals:   03/20/14 0900  BP: 120/51  Pulse: 109  Temp: 98.5 F (36.9 C)  Resp: 20   Last BM Date: 03/19/14 General:   Well-developed, well-nourished, pleasant and cooperative in NAD HEENT: anicteric Neck: supple,  nontender Lungs:  Clear throughout to auscultation.   No wheezes, crackles, or rhonchi. No acute distress. Heart:  Regular rate and rhythm; no murmurs, clicks, rubs,  or gallops. Abdomen: soft, NT, ND, +BS  Rectal:  Deferred Ext: no edema Neuro: alert, oriented Skin: no rash  GI:  Lab Results:  Recent Labs  03/19/14 1612 03/19/14 2240 03/20/14 0410  WBC 9.8  --  7.8  HGB 6.9* 6.4* 6.9*  HCT 21.9* 19.7* 20.7*  PLT 144*  --  113*   BMET  Recent Labs  03/19/14 1612 03/20/14 0410  NA 131* 133*  K 4.3 4.3  CL 92* 97  CO2 21 20  GLUCOSE 316* 222*  BUN 19 23  CREATININE 1.95* 2.12*  CALCIUM 9.3 8.3*   LFT  Recent Labs  03/20/14 0410  PROT 6.9  ALBUMIN 2.9*  AST 151*  ALT 29  ALKPHOS 184*  BILITOT 1.9*   PT/INR No results found for this basename: LABPROT, INR,  in the last 72 hours   Studies/Results: US Abdomen Complete  03/19/2014   CLINICAL DATA:  Abnormal hepatic function studies ; history of alcohol abuse  EXAM: ULTRASOUND ABDOMEN COMPLETE  COMPARISON:  None.  FINDINGS: Gallbladder:  The gallbladder is contracted and contains echogenic material consistent with sludge. The patient's most recent meal was approximately 6 hr ago. There is no positive sonographic Murphy's sign. There is no gallbladder wall thickening or pericholecystic fluid.  Common bile duct:  Diameter: 5 mm  Liver:  The echotexture of the liver is mildly increased. There is no focal mass or ductal dilation.  IVC:  No abnormality visualized.  Pancreas:  Visualized portion unremarkable.  Spleen:  There is mild splenomegaly. The splenic volume is approximately 660 cc  Right Kidney:  Length: 12.5 cm. Echogenicity within normal limits. No mass or hydronephrosis visualized.  Left Kidney:  Length: 12.9 cm. Echogenicity within normal limits. No mass or hydronephrosis visualized.  Abdominal aorta:  Bowel gas obscured the abdominal aorta with exception of the proximal portion which measured 1.8 cm in diameter.   Other findings:  There is no ascites.  IMPRESSION: 1. The gallbladder is contracted and contains sludge but no discrete stones. There is no sonographic evidence of acute cholecystitis. Chronic cholecystitis is not excluded. 2. There is likely fatty infiltrative change of the liver. 3. There is splenomegaly which may be related to patent to hepatocellular dysfunction.   Electronically Signed   By: David  [place]   On: 03/19/2014 19:10    Impression/Plan: 38 yo with severe anemia and heme positive stool without any overt bleeding. Needs an updated EGD to check for peptic ulcer disease, gastritis, and esophagitis. Doubt varices as the source of the anemia. Agree with transfusion. NPO. Continue Protonix 40  mg IV QD. Supportive care.    LOS: 1 day   Danaka Llera C.  03/20/2014, 9:47 AM

## 2014-03-20 NOTE — Progress Notes (Signed)
Pt's CBG at 2130 was 232. [facility] novolog at this time as well as 15 units lantus hs. Pt NPO at this time. Pt stated his blood sugar fluctuates rapidly; this am his CBG was over 400. MD notified regarding insulin. RN has not rieceived response, but is holdng all insulin tonight due to pt being NPO. Will continue to monitor pt's CBGs q4 per order. Mardene CelesteAsaro, Chauntelle Azpeitia I

## 2014-03-20 NOTE — Op Note (Signed)
[facility] [address] [place], [id number]   ENDOSCOPY PROCEDURE REPORT  PATIENT: Jerome [name], Malone  MR#: [id number] BIRTHDATE: [birth year] , 38  yrs. old GENDER: Male  ENDOSCOPIST: Charlott RakesVincent Lacinda Curvin, MD REFERRED XB:[id number]:hospital team  PROCEDURE DATE:  03/20/2014 PROCEDURE:   EGD, diagnostic ASA CLASS:   Class II INDICATIONS:Anemia. MEDICATIONS: Fentanyl 100 mcg IV, Versed 7 mg IV, Cetacaine spray x 2, and Diphenhydramine (Benadryl) 50 mg IV  TOPICAL ANESTHETIC:  DESCRIPTION OF PROCEDURE:   After the risks benefits and alternatives of the procedure were thoroughly explained, informed consent was obtained.  The Pentax Gastroscope [id number]  endoscope was introduced through the mouth and advanced to the second portion of the duodenum , limited by Limited by patient discomfort and ineffective sedation.   The instrument was slowly withdrawn as the mucosa was fully examined.     FINDINGS: The endoscope was inserted into the oropharynx and esophagus was intubated.  Small to medium-sized nonbleeding distal esophageal varices noted. The gastroesophageal junction was noted to be 38 cm from the incisors.  Endoscope was advanced into the stomach, which revealed moderate portal gastropathy with diffuse subepithelial hemorrhages without active bleeding on insertion. Diffuse gastritis in areas of gastropathy noted as well. The endoscope was advanced to the duodenal bulb and second portion of duodenum which were unremarkable.  The endoscope was withdrawn back into the stomach and retroflexion revealed a focal area of bleeding in the fundus that likely developed from excessive retching during the procedure. Bleeding stopped spontaneously. Small hiatal hernia noted.  COMPLICATIONS: None  ENDOSCOPIC IMPRESSION:     Moderate portal gastropathy - focal area of bleeding developed during procedure likely due to retching Nonbleeding distal esophageal varices Gastritis Small  hiatal hernia  RECOMMENDATIONS: Start Octreotide; Protonix drip; Supportive care; NPO except ice chips   REPEAT EXAM: N/A  _______________________________ Charlott RakesVincent Bradrick Kamau, MD eSigned:  Charlott RakesVincent Tinesha Siegrist, MD 03/20/2014 1:19 PM    CC:  PATIENT NAME:  Jerome [name], Tabor MR#: [id number]

## 2014-03-20 NOTE — Interval H&P Note (Signed)
History and Physical Interval Note:  03/20/2014 12:43 PM  Jerome [name]  has presented today for surgery, with the diagnosis of Anemia, Cirrhosis  The various methods of treatment have been discussed with the patient and family. After consideration of risks, benefits and other options for treatment, the patient has consented to  Procedure(s): ESOPHAGOGASTRODUODENOSCOPY (EGD) (N/A) as a surgical intervention .  The patient's history has been reviewed, patient examined, no change in status, stable for surgery.  I have reviewed the patient's chart and labs.  Questions were answered to the patient's satisfaction.     Callista Hoh C.

## 2014-03-20 NOTE — Progress Notes (Signed)
CRITICAL VALUE ALERT  Critical value received:  Hbg 6.4  Date of notification:  03/19/2014  Time of notification:  2300  Critical value read back:Yes.    Nurse who received alert:  Mardene CelesteAsaro, Nathan Moctezuma I  MD notified (1st page):  n/a  Time of first page:  n/a  MD aware of pt's low hemoglobin. RN to transfuse blood. Will continue to monitor pt closely. Mardene CelesteAsaro, Evanny Ellerbe I

## 2014-03-20 NOTE — Care Management Note (Signed)
    Page 1 of 1   03/20/2014     2:52:29 PM CARE MANAGEMENT NOTE 03/20/2014  Patient:  Jerome [name],Jerome [name]   Account Number:  [id number]  Date Initiated:  03/20/2014  Documentation initiated by:  Lanier ClamMAHABIR,Shaunee Mulkern  Subjective/Objective Assessment:   38 Y/O M ADMITTED W/CHRONIC ETOH ABUSE.     Action/Plan:   [facility]   Anticipated DC Date:  03/23/2014   Anticipated DC Plan:  HOME/SELF CARE      DC Planning Services  CM consult      Choice offered to / List presented to:             Status of service:  In process, will continue to follow Medicare Important Message given?   (If response is "NO", the following Medicare IM given date fields will be blank) Date Medicare IM given:   Medicare IM given by:   Date Additional Medicare IM given:   Additional Medicare IM given by:    Discharge Disposition:    Per UR Regulation:  Reviewed for med. necessity/level of care/duration of stay  If discussed at Long Length of Stay Meetings, dates discussed:    Comments:  03/20/14 Agape Hardiman RN,BSN NCM [phone] MONITOR PROGRESS, & D/C PLANS.

## 2014-03-20 NOTE — Progress Notes (Signed)
[facility] PROGRESS NOTE  Jerome [name] [id number]:[id number] DOB: 11/02/1975 DOA: 03/19/2014 PCP: No PCP Per Patient  Assessment/Plan: 38 y.o. male with chronic alcohol abuse [facility]. Patient states that he had been again on a drinking binge and decided that he wanted to go in for detox. Patient states that he [facility] and was found to have a low hemoglobin. Patient states that his last drink was yesterday and he normally was drinking about a pint of Vodka daily.   1. Alcohol Abuse recurring episodes; lengthy discussion with the patient regarding his ongoing alcohol abuse  -he states that he is finally ready to quit and detox  -no s/s of withdrawal; monitor on CIWA   2. Acute blood loss anemia with history of Esophageal varices; IDA -TF sing 2 units; pend EGD; TF prn  3. Diabetes Mellitus; cont continue with his insulin; d/c metformin due to CKD: check A1C 11.2 -patient is non adherent to meds; reemphasized medication compliance  4. Hypertension hold ARB with AKI; BP soft; BB if tolerated   5. Cirrhosis of the liver appears to be advance with clinical portal hypertension signs;  -likely underlying mild alcoholic hepatitis; check INR; GI consult OP f/u; stop etoh; hepatitis panel neg; UE: splenomegaly, fatty liver  6. AKI with probable underlying CKD: US: no hydro -hydrate as tolerated ; recheck labs in AM; hold ARB   Code Status: full Family Communication: d/w patient  (indicate person spoken with, relationship, and if by phone, the number) Disposition Plan: home pend clinical improvement    Consultants:  GI  Procedures:  Pend EGD   Antibiotics:  none (indicate start date, and stop date if known)  HPI/Subjective: alert  Objective: Filed Vitals:   03/20/14 0900  BP: 120/51  Pulse: 109  Temp: 98.5 F (36.9 C)  Resp: 20    Intake/Output Summary (Last 24 hours) at 03/20/14 1005 Last data filed at 03/20/14 0700  Gross per 24  hour  Intake 966.67 ml  Output    325 ml  Net 641.67 ml   Filed Weights   03/19/14 2141  Weight: 86.183 kg (190 lb)    Exam:   General:  alert  Cardiovascular: s1,s2 rrr  Respiratory: CTA BL  Abdomen: soft, dintended, NT  Musculoskeletal: no LE edema   Data Reviewed: Basic Metabolic Panel:  Recent Labs Lab 03/19/14 1612 03/20/14 0410  NA 131* 133*  K 4.3 4.3  CL 92* 97  CO2 21 20  GLUCOSE 316* 222*  BUN 19 23  CREATININE 1.95* 2.12*  CALCIUM 9.3 8.3*   Liver Function Tests:  Recent Labs Lab 03/19/14 1612 03/20/14 0410  AST 153* 151*  ALT 34 29  ALKPHOS 230* 184*  BILITOT 2.1* 1.9*  PROT 7.9 6.9  ALBUMIN 3.5 2.9*    Recent Labs Lab 03/19/14 1744  LIPASE 90*   No results found for this basename: AMMONIA,  in the last 168 hours CBC:  Recent Labs Lab 03/19/14 1612 03/19/14 2240 03/20/14 0410  WBC 9.8  --  7.8  HGB 6.9* 6.4* 6.9*  HCT 21.9* 19.7* 20.7*  MCV 94.0  --  90.4  PLT 144*  --  113*   Cardiac Enzymes: No results found for this basename: CKTOTAL, CKMB, CKMBINDEX, TROPONINI,  in the last 168 hours BNP (last 3 results) No results found for this basename: PROBNP,  in the last 8760 hours CBG:  Recent Labs Lab 03/19/14 1708 03/19/14 2139 03/19/14 2338 03/20/14 0420 03/20/14 0759  GLUCAP 316* 232* 230* 209* 226*    No results found for this or any previous visit (from the past 240 hour(s)).   Studies: US Abdomen Complete  03/19/2014   CLINICAL DATA:  Abnormal hepatic function studies ; history of alcohol abuse  EXAM: ULTRASOUND ABDOMEN COMPLETE  COMPARISON:  None.  FINDINGS: Gallbladder:  The gallbladder is contracted and contains echogenic material consistent with sludge. The patient's most recent meal was approximately 6 hr ago. There is no positive sonographic Murphy's sign. There is no gallbladder wall thickening or pericholecystic fluid.  Common bile duct:  Diameter: 5 mm  Liver:  The echotexture of the liver is mildly  increased. There is no focal mass or ductal dilation.  IVC:  No abnormality visualized.  Pancreas:  Visualized portion unremarkable.  Spleen:  There is mild splenomegaly. The splenic volume is approximately 660 cc  Right Kidney:  Length: 12.5 cm. Echogenicity within normal limits. No mass or hydronephrosis visualized.  Left Kidney:  Length: 12.9 cm. Echogenicity within normal limits. No mass or hydronephrosis visualized.  Abdominal aorta:  Bowel gas obscured the abdominal aorta with exception of the proximal portion which measured 1.8 cm in diameter.  Other findings:  There is no ascites.  IMPRESSION: 1. The gallbladder is contracted and contains sludge but no discrete stones. There is no sonographic evidence of acute cholecystitis. Chronic cholecystitis is not excluded. 2. There is likely fatty infiltrative change of the liver. 3. There is splenomegaly which may be related to patent to hepatocellular dysfunction.   Electronically Signed   By: David  [place]   On: 03/19/2014 19:10    Scheduled Meds: . folic acid  1 mg Oral Daily  . insulin aspart  0-15 Units Subcutaneous 6 times per day  . insulin glargine  15 Units Subcutaneous QHS  . LORazepam  0-4 mg Intravenous 4 times per day   Followed by  . [START ON 03/21/2014] LORazepam  0-4 mg Intravenous Q12H  . losartan  50 mg Oral Daily  . magnesium oxide  400 mg Oral BID  . multivitamin with minerals  1 tablet Oral Daily  . ondansetron (ZOFRAN) IV  4 mg Intravenous Once  . pantoprazole (PROTONIX) IV  40 mg Intravenous Daily  . sodium chloride  3 mL Intravenous Q12H  . thiamine  100 mg Oral Daily   Or  . thiamine  100 mg Intravenous Daily  . [START ON 03/25/2014] Vitamin D (Ergocalciferol)  50,000 Units Oral Q7 days   Continuous Infusions: . sodium chloride 75 mL/hr at 03/19/14 2153    Principal Problem:   Alcohol abuse Active Problems:   Diabetes mellitus   Hypertension   Anemia    Time spent: >35 minutes     Esperanza Sheets  [facility] Pager [phone]. If 7PM-7AM, please contact night-coverage at [email or web address], password [facility] 03/20/2014, 10:05 AM  LOS: 1 day

## 2014-03-20 NOTE — H&P (View-Only) (Signed)
Referring Provider: Dr. York SpanielBuriev Primary Care Physician:  No PCP Per Patient Primary Gastroenterologist:  Gentry FitzUnassigned  Reason for Consultation:  Anemia  HPI: Jerome [name] is a 38 y.o. male with history of alcohol abuse and cirrhosis (history of esophageal varices and gastritis on an EGD about a month ago in [place], [place] - records not available to me at this time). He was sent over from detox because of a low Hgb. Reports that his Hgb was 6 about 2 weeks ago when it was checked. Drinks a pint of Vodka daily and has drunk alcohol heavily for years. Denies abdominal pain, N/V/hematemesis/hematochezia/melena. Reports watery stools when he drinks heavily and had the diarrhea 2 days in the past week. Stools have been dark brown and denies that they have been black or red. Denies dizziness or lightheadedness. Hgb 6.9 on admit to hospital. Heme positive.     Past Medical History  Diagnosis Date  . Diabetes mellitus without complication   . Hypertension     History reviewed. No pertinent past surgical history.  Prior to Admission medications   Medication Sig Start Date End Date Taking? Authorizing Provider  albuterol (PROVENTIL HFA;VENTOLIN HFA) 108 (90 BASE) MCG/ACT inhaler Inhale 1-2 puffs into the lungs every 6 (six) hours as needed for wheezing or shortness of breath (shortness of breath).   Yes Historical Provider, MD  insulin glargine (LANTUS) 100 UNIT/ML injection Inject 15 Units into the skin at bedtime.   Yes Historical Provider, MD  LORazepam (ATIVAN) 1 MG tablet Take 1 mg by mouth every 6 (six) hours as needed for anxiety (anxiety).   Yes Historical Provider, MD  losartan (COZAAR) 50 MG tablet Take 50 mg by mouth daily.   Yes Historical Provider, MD  magnesium oxide (MAG-OX) 400 MG tablet Take 400 mg by mouth 2 (two) times daily.   Yes Historical Provider, MD  metFORMIN (GLUCOPHAGE) 500 MG tablet Take 500 mg by mouth 2 (two) times daily with a meal.   Yes Historical Provider, MD   Vitamin D, Ergocalciferol, (DRISDOL) 50000 UNITS CAPS capsule Take 50,000 Units by mouth every 7 (seven) days. Wednesday   Yes Historical Provider, MD    Scheduled Meds: . folic acid  1 mg Oral Daily  . insulin aspart  0-15 Units Subcutaneous 6 times per day  . insulin glargine  15 Units Subcutaneous QHS  . LORazepam  0-4 mg Intravenous 4 times per day   Followed by  . [START ON 03/21/2014] LORazepam  0-4 mg Intravenous Q12H  . losartan  50 mg Oral Daily  . magnesium oxide  400 mg Oral BID  . multivitamin with minerals  1 tablet Oral Daily  . ondansetron (ZOFRAN) IV  4 mg Intravenous Once  . pantoprazole (PROTONIX) IV  40 mg Intravenous Daily  . sodium chloride  3 mL Intravenous Q12H  . thiamine  100 mg Oral Daily   Or  . thiamine  100 mg Intravenous Daily  . [START ON 03/25/2014] Vitamin D (Ergocalciferol)  50,000 Units Oral Q7 days   Continuous Infusions: . sodium chloride 75 mL/hr at 03/19/14 2153   PRN Meds:.albuterol, LORazepam, LORazepam, ondansetron (ZOFRAN) IV, ondansetron  Allergies as of 03/19/2014  . (No Known Allergies)    No family history on file.  History   Social History  . Marital Status: Single    Spouse Name: N/A    Number of Children: N/A  . Years of Education: N/A   Occupational History  . Not on file.   Social  History Main Topics  . Smoking status: Never Smoker   . Smokeless tobacco: Not on file  . Alcohol Use: Yes     Comment: pint daily-in rehab as of yesterday (03/18/14)  . Drug Use: No  . Sexual Activity: Not on file   Other Topics Concern  . Not on file   Social History Narrative  . No narrative on file    Review of Systems: All negative except as stated above in HPI.  Physical Exam: Vital signs: Filed Vitals:   03/20/14 0900  BP: 120/51  Pulse: 109  Temp: 98.5 F (36.9 C)  Resp: 20   Last BM Date: 03/19/14 General:   Well-developed, well-nourished, pleasant and cooperative in NAD HEENT: anicteric Neck: supple,  nontender Lungs:  Clear throughout to auscultation.   No wheezes, crackles, or rhonchi. No acute distress. Heart:  Regular rate and rhythm; no murmurs, clicks, rubs,  or gallops. Abdomen: soft, NT, ND, +BS  Rectal:  Deferred Ext: no edema Neuro: alert, oriented Skin: no rash  GI:  Lab Results:  Recent Labs  03/19/14 1612 03/19/14 2240 03/20/14 0410  WBC 9.8  --  7.8  HGB 6.9* 6.4* 6.9*  HCT 21.9* 19.7* 20.7*  PLT 144*  --  113*   BMET  Recent Labs  03/19/14 1612 03/20/14 0410  NA 131* 133*  K 4.3 4.3  CL 92* 97  CO2 21 20  GLUCOSE 316* 222*  BUN 19 23  CREATININE 1.95* 2.12*  CALCIUM 9.3 8.3*   LFT  Recent Labs  03/20/14 0410  PROT 6.9  ALBUMIN 2.9*  AST 151*  ALT 29  ALKPHOS 184*  BILITOT 1.9*   PT/INR No results found for this basename: LABPROT, INR,  in the last 72 hours   Studies/Results: US Abdomen Complete  03/19/2014   CLINICAL DATA:  Abnormal hepatic function studies ; history of alcohol abuse  EXAM: ULTRASOUND ABDOMEN COMPLETE  COMPARISON:  None.  FINDINGS: Gallbladder:  The gallbladder is contracted and contains echogenic material consistent with sludge. The patient's most recent meal was approximately 6 hr ago. There is no positive sonographic Murphy's sign. There is no gallbladder wall thickening or pericholecystic fluid.  Common bile duct:  Diameter: 5 mm  Liver:  The echotexture of the liver is mildly increased. There is no focal mass or ductal dilation.  IVC:  No abnormality visualized.  Pancreas:  Visualized portion unremarkable.  Spleen:  There is mild splenomegaly. The splenic volume is approximately 660 cc  Right Kidney:  Length: 12.5 cm. Echogenicity within normal limits. No mass or hydronephrosis visualized.  Left Kidney:  Length: 12.9 cm. Echogenicity within normal limits. No mass or hydronephrosis visualized.  Abdominal aorta:  Bowel gas obscured the abdominal aorta with exception of the proximal portion which measured 1.8 cm in diameter.   Other findings:  There is no ascites.  IMPRESSION: 1. The gallbladder is contracted and contains sludge but no discrete stones. There is no sonographic evidence of acute cholecystitis. Chronic cholecystitis is not excluded. 2. There is likely fatty infiltrative change of the liver. 3. There is splenomegaly which may be related to patent to hepatocellular dysfunction.   Electronically Signed   By: David  [place]   On: 03/19/2014 19:10    Impression/Plan: 38 yo with severe anemia and heme positive stool without any overt bleeding. Needs an updated EGD to check for peptic ulcer disease, gastritis, and esophagitis. Doubt varices as the source of the anemia. Agree with transfusion. NPO. Continue Protonix 40  mg IV QD. Supportive care.    LOS: 1 day   Shakima Nisley C.  03/20/2014, 9:47 AM

## 2014-03-20 NOTE — Progress Notes (Signed)
INITIAL NUTRITION ASSESSMENT  DOCUMENTATION CODES Per approved criteria  -Severe malnutrition in the context of chronic illness  Pt meets criteria for severe MALNUTRITION in the context of chronic illness as evidenced by 7% body weight loss in one month, PO intake <75% for > one month.   INTERVENTION: -Encouraged use of Glucerna, Sugar Free [employer] or Boost GlucoseControl as warranted -Encouraged compliance to DM diet recommendations -RD to continue to monitor  NUTRITION DIAGNOSIS: Inadequate oral intake related to ETOH abuse/decreased appetite as evidenced by 15 lbs wt loss, PO intake <75%.   Goal: Pt to meet >/= 90% of their estimated nutrition needs    Monitor:  Diet order, total protein/energy intake, labs, weights, GI profile  Reason for Assessment: MST  38 y.o. male  Admitting Dx: Alcohol abuse  ASSESSMENT: 38 y.o. male with chronic alcohol abuse [facility]. Patient states that he had been again on a drinking binge and decided that he wanted to go in for detox  -Pt endorsed an unintentional wt loss of 15 lbs in past three weeks d/t ETOH abuse (7% body weight loss, significant for time frame) and decreased PO intake -Diet recall indicates pt consuming one large meal/day, usually of fast foods. Pt reported he works long hours, and finds it hard to consume three balanced meals w/work hours. MD noted pt consuming one pint of vodka daily -Pt noted to be a newly dx DM2, was dx approximately one month ago. Received DM diet education materials at time of dx, declined further education materials or outpatient DM referral -NPO for EGD. Recommended addition of Boost GlucoseControl, Glucerna or [employer] instant breakfast for meal replacement to assist in blood glucose control and for weight management. -Encouraged pt to comply with diet recommendations, and physical activity for blood glucose control and healthy controlled weight  loss  Height: Ht Readings from Last 1 Encounters:  03/19/14 5\' 10"  (1.778 m)    Weight: Wt Readings from Last 1 Encounters:  03/19/14 190 lb (86.183 kg)    Ideal Body Weight: 166 lbs  % Ideal Body Weight: 114%  Wt Readings from Last 10 Encounters:  03/19/14 190 lb (86.183 kg)  03/19/14 190 lb (86.183 kg)    Usual Body Weight: 205 lbs  % Usual Body Weight: 93%  BMI:  Body mass index is 27.26 kg/(m^2).  Estimated Nutritional Needs: Kcal: 1800-2000 Protein: 85-100 gram Fluid: >/=1800 ml/daiy  Skin: WDL  Diet Order: NPO  EDUCATION NEEDS: -Education needs addressed   Intake/Output Summary (Last 24 hours) at 03/20/14 1259 Last data filed at 03/20/14 1050  Gross per 24 hour  Intake 1301.67 ml  Output    325 ml  Net 976.67 ml    Last BM: 7/23   Labs:   Recent Labs Lab 03/19/14 1612 03/20/14 0410  NA 131* 133*  K 4.3 4.3  CL 92* 97  CO2 21 20  BUN 19 23  CREATININE 1.95* 2.12*  CALCIUM 9.3 8.3*  GLUCOSE 316* 222*    CBG (last 3)   Recent Labs  03/20/14 0420 03/20/14 0759 03/20/14 1137  GLUCAP 209* 226* 198*    Scheduled Meds: . folic acid  1 mg Oral Daily  . insulin aspart  0-15 Units Subcutaneous 6 times per day  . insulin glargine  15 Units Subcutaneous QHS  . LORazepam  0-4 mg Intravenous 4 times per day   Followed by  . [START ON 03/21/2014] LORazepam  0-4 mg Intravenous Q12H  . magnesium oxide  400 mg Oral BID  . multivitamin with minerals  1 tablet Oral Daily  . ondansetron (ZOFRAN) IV  4 mg Intravenous Once  . pantoprazole (PROTONIX) IV  40 mg Intravenous Daily  . sodium chloride  3 mL Intravenous Q12H  . thiamine  100 mg Oral Daily   Or  . thiamine  100 mg Intravenous Daily  . [START ON 03/25/2014] Vitamin D (Ergocalciferol)  50,000 Units Oral Q7 days    Continuous Infusions: . sodium chloride 500 mL (03/20/14 1202)    Past Medical History  Diagnosis Date  . Diabetes mellitus without complication   . Hypertension      History reviewed. No pertinent past surgical history.  Lloyd HugerSarah F Savita Runner MS RD LDN Clinical Dietitian [phone]

## 2014-03-21 DIAGNOSIS — E43 Unspecified severe protein-calorie malnutrition: Secondary | ICD-10-CM | POA: Insufficient documentation

## 2014-03-21 LAB — TYPE AND SCREEN
ABO/RH(D): O POS
Antibody Screen: NEGATIVE
UNIT DIVISION: 0
Unit division: 0

## 2014-03-21 LAB — BASIC METABOLIC PANEL
ANION GAP: 14 (ref 5–15)
BUN: 12 mg/dL (ref 6–23)
CALCIUM: 8.2 mg/dL — AB (ref 8.4–10.5)
CO2: 21 mEq/L (ref 19–32)
CREATININE: 0.96 mg/dL (ref 0.50–1.35)
Chloride: 100 mEq/L (ref 96–112)
GFR calc Af Amer: >90 mL/min (ref >90)
GFR calc non Af Amer: >90 mL/min (ref >90)
Glucose, Bld: 196 mg/dL — ABNORMAL HIGH (ref 70–99)
Potassium: 4 mEq/L (ref 3.7–5.3)
SODIUM: 135 meq/L — AB (ref 137–147)

## 2014-03-21 LAB — GLUCOSE, CAPILLARY
GLUCOSE-CAPILLARY: 180 mg/dL — AB (ref 70–99)
GLUCOSE-CAPILLARY: 191 mg/dL — AB (ref 70–99)
Glucose-Capillary: 171 mg/dL — ABNORMAL HIGH (ref 70–99)
Glucose-Capillary: 188 mg/dL — ABNORMAL HIGH (ref 70–99)

## 2014-03-21 LAB — CBC
HCT: 23.9 % — ABNORMAL LOW (ref 39.0–52.0)
Hemoglobin: 7.8 g/dL — ABNORMAL LOW (ref 13.0–17.0)
MCH: 29.7 pg (ref 26.0–34.0)
MCHC: 32.6 g/dL (ref 30.0–36.0)
MCV: 90.9 fL (ref 78.0–100.0)
Platelets: 123 10*3/uL — ABNORMAL LOW (ref 150–400)
RBC: 2.63 MIL/uL — AB (ref 4.22–5.81)
RDW: 16.8 % — AB (ref 11.5–15.5)
WBC: 7.8 10*3/uL (ref 4.0–10.5)

## 2014-03-21 MED ORDER — PANTOPRAZOLE SODIUM 40 MG PO TBEC: 40.0000 mg | DELAYED_RELEASE_TABLET | Freq: Two times a day (BID) | ORAL | Status: DC

## 2014-03-21 MED ORDER — INSULIN GLARGINE 100 UNIT/ML ~~LOC~~ SOLN: 15.0000 [IU] | Freq: Every day | SUBCUTANEOUS | Status: DC

## 2014-03-21 MED ORDER — FERROUS SULFATE 325 (65 FE) MG PO TABS: 325.0000 mg | ORAL_TABLET | Freq: Every day | ORAL | Status: DC

## 2014-03-21 NOTE — Discharge Summary (Signed)
Physician Discharge Summary  Jerome [name] YNW:[id number] DOB: [birth year] DOA: 03/19/2014  PCP: No PCP Per Patient  Admit date: 03/19/2014 Discharge date: 03/21/2014  Time spent: >35 minutes  Recommendations for Outpatient Follow-up:  F/u with GI next week F/u with PCP in 1 week   Discharge Diagnoses:  Principal Problem:   Alcohol abuse Active Problems:   Diabetes mellitus   Hypertension   Anemia   Protein-calorie malnutrition, severe   Discharge Condition: stable   Diet recommendation: DM  Filed Weights   03/19/14 2141  Weight: 86.183 kg (190 lb)    History of present illness:  38 y.o. male with chronic alcohol abuse [facility]. Patient states that he had been again on a drinking binge and decided that he wanted to go in for detox. Patient states [facility] and was found to have a low hemoglobin. Patient states that his last drink was yesterday and he normally was drinking about a pint of Vodka daily.    Hospital Course:  1. Alcohol Abuse recurring episodes; lengthy discussion with the patient regarding his ongoing alcohol abuse  -he states that he is finally ready to quit and detox; but he refused to get into detox program at this time   -no s/s of withdrawal; he wants to go home and f/u in [place]  2. Acute blood loss anemia with history of Esophageal varices; IDA  -TF sing 2 units;Hg is stable; no new bleeding;l  -s/p EGD: Moderate portal gastropathy - focal area  of bleeding developed during procedure likely due to retching Nonbleeding distal esophageal varices  Gastritis Small hiatal hernia -Pt wanted to go home and f/u with GI, PCP in [place]; okay to discharge per GI  3. Diabetes Mellitus; cont continue with his insulin; d/c metformin due to CKD: checked A1C 11.2  -patient is non adherent to meds; reemphasized medication compliance  4. Hypertension resume ARB, OP titrate as needed  5. 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 AKI with probable underlying CKD: [place]: no hydro  -hydrate as tolerated ; resolved     Procedures:  EGD (i.e. Studies not automatically included, echos, thoracentesis, etc; not x-rays)  Consultations:  GI  Discharge Exam: Filed Vitals:   03/21/14 0558  BP: 123/65  Pulse: 93  Temp: 98.8 F (37.1 C)  Resp: 20    General: alert Cardiovascular: s1,s2 rrr Respiratory: CTA BL  Discharge Instructions  Discharge Instructions   Diet - low sodium heart healthy    Complete by:  As directed      Discharge instructions    Complete by:  As directed   Please follow up with primary care doctor in 1 week  Please follow up with gastroenterologist in 1 week     Increase activity slowly    Complete by:  As directed             Medication List    STOP taking these medications       metFORMIN 500 MG tablet  Commonly known as:  GLUCOPHAGE      TAKE these medications       albuterol 108 (90 BASE) MCG/ACT inhaler  Commonly known as:  PROVENTIL HFA;VENTOLIN HFA  Inhale 1-2 puffs into the lungs every 6 (  six) hours as needed for wheezing or shortness of breath (shortness of breath).     ferrous sulfate 325 (65 FE) MG tablet  Take 1 tablet (325 mg total) by mouth daily with breakfast.     insulin glargine 100 UNIT/ML injection  Commonly known as:  LANTUS  Inject 0.15 mLs (15 Units total) into the skin at bedtime.     LORazepam 1 MG tablet  Commonly known as:  ATIVAN  Take 1 mg by mouth every 6 (six) hours as needed for anxiety (anxiety).     losartan 50 MG tablet  Commonly known as:  COZAAR  Take 50 mg by mouth daily.     magnesium oxide 400 MG tablet  Commonly known as:  MAG-OX  Take 400 mg by mouth 2 (two) times daily.     pantoprazole 40  MG tablet  Commonly known as:  PROTONIX  Take 1 tablet (40 mg total) by mouth 2 (two) times daily.     Vitamin D (Ergocalciferol) 50000 UNITS Caps capsule  Commonly known as:  DRISDOL  Take 50,000 Units by mouth every 7 (seven) days. Wednesday       No Known Allergies     Follow-up Information   Follow up with No PCP Per Patient.   Specialty:  General Practice       The results of significant diagnostics from this hospitalization (including imaging, microbiology, ancillary and laboratory) are listed below for reference.    Significant Diagnostic Studies: US Abdomen Complete  03/19/2014   CLINICAL DATA:  Abnormal hepatic function studies ; history of alcohol abuse  EXAM: ULTRASOUND ABDOMEN COMPLETE  COMPARISON:  None.  FINDINGS: Gallbladder:  The gallbladder is contracted and contains echogenic material consistent with sludge. The patient's most recent meal was approximately 6 hr ago. There is no positive sonographic Murphy's sign. There is no gallbladder wall thickening or pericholecystic fluid.  Common bile duct:  Diameter: 5 mm  Liver:  The echotexture of the liver is mildly increased. There is no focal mass or ductal dilation.  IVC:  No abnormality visualized.  Pancreas:  Visualized portion unremarkable.  Spleen:  There is mild splenomegaly. The splenic volume is approximately 660 cc  Right Kidney:  Length: 12.5 cm. Echogenicity within normal limits. No mass or hydronephrosis visualized.  Left Kidney:  Length: 12.9 cm. Echogenicity within normal limits. No mass or hydronephrosis visualized.  Abdominal aorta:  Bowel gas obscured the abdominal aorta with exception of the proximal portion which measured 1.8 cm in diameter.  Other findings:  There is no ascites.  IMPRESSION: 1. The gallbladder is contracted and contains sludge but no discrete stones. There is no sonographic evidence of acute cholecystitis. Chronic cholecystitis is not excluded. 2. There is likely fatty infiltrative change of  the liver. 3. There is splenomegaly which may be related to patent to hepatocellular dysfunction.   Electronically Signed   By: David  [place]   On: 03/19/2014 19:10    Microbiology: Recent Results (from the past 240 hour(s))  CLOSTRIDIUM DIFFICILE BY PCR     Status: None   Collection Time    03/20/14  7:09 AM      Result Value Ref Range Status   C difficile by pcr NEGATIVE  NEGATIVE Final   Comment: Performed at [facility]     Labs: Basic Metabolic Panel:  Recent Labs Lab 03/19/14 1612 03/20/14 0410 03/21/14 0433  NA 131* 133* 135*  K 4.3 4.3 4.0  CL 92* 97 100  CO2 21 20 21   GLUCOSE 316* 222* 196*  BUN 19 23 12   CREATININE 1.95* 2.12* 0.96  CALCIUM 9.3 8.3* 8.2*   Liver Function Tests:  Recent Labs Lab 03/19/14 1612 03/20/14 0410  AST 153* 151*  ALT 34 29  ALKPHOS 230* 184*  BILITOT 2.1* 1.9*  PROT 7.9 6.9  ALBUMIN 3.5 2.9*    Recent Labs Lab 03/19/14 1744  LIPASE 90*   No results found for this basename: AMMONIA,  in the last 168 hours CBC:  Recent Labs Lab 03/19/14 1612 03/19/14 2240 03/20/14 0410 03/20/14 1529 03/21/14 0433  WBC 9.8  --  7.8  --  7.8  HGB 6.9* 6.4* 6.9* 8.0* 7.8*  HCT 21.9* 19.7* 20.7* 23.9* 23.9*  MCV 94.0  --  90.4  --  90.9  PLT 144*  --  113*  --  123*   Cardiac Enzymes: No results found for this basename: CKTOTAL, CKMB, CKMBINDEX, TROPONINI,  in the last 168 hours BNP: BNP (last 3 results) No results found for this basename: PROBNP,  in the last 8760 hours CBG:  Recent Labs Lab 03/20/14 1551 03/20/14 2014 03/20/14 2349 03/21/14 0418 03/21/14 0740  GLUCAP 177* 180* 171* 188* 191*       Signed:  Keziah Drotar N  [facility] 03/21/2014, 10:17 AM

## 2014-03-21 NOTE — Discharge Instructions (Signed)
Please follow up with primary care doctor in 1 week  Please follow up with gastroenterologist in 1 week

## 2014-03-21 NOTE — Consult Note (Signed)
[facility] Progress Note  Subjective: The patient feels well today. He denies any further bleeding. He is up walking around in the room and wants to go home. He states that he lives in [place] and wants to go back to [place]. He does not want to go back [facility] as he didn't really like it.  Objective: Vital signs in last 24 hours: Temp:  [98.4 F (36.9 C)-99.2 F (37.3 C)] 98.8 F (37.1 C) (07/25 0558) Pulse Rate:  [93-119] 93 (07/25 0558) Resp:  [20-28] 20 (07/25 0558) BP: (86-144)/(46-85) 123/65 mmHg (07/25 0558) SpO2:  [96 %-100 %] 96 % (07/25 0558) Weight change:    PE:  No distress  Heart regular rhythm  Lungs clear  Abdomen: Soft and nontender  Lab Results: Results for orders placed during the hospital encounter of 03/19/14 (from the past 24 hour(s))  GLUCOSE, CAPILLARY     Status: Abnormal   Collection Time    03/20/14 11:37 AM      Result Value Ref Range   Glucose-Capillary 198 (*) 70 - 99 mg/dL  PROTIME-INR     Status: Abnormal   Collection Time    03/20/14  1:59 PM      Result Value Ref Range   Prothrombin Time 16.4 (*) 11.6 - 15.2 seconds   INR 1.32  0.00 - 1.49  HEMOGLOBIN AND HEMATOCRIT, BLOOD     Status: Abnormal   Collection Time    03/20/14  3:29 PM      Result Value Ref Range   Hemoglobin 8.0 (*) 13.0 - 17.0 g/dL   HCT 16.123.9 (*) 09.639.0 - 04.552.0 %  GLUCOSE, CAPILLARY     Status: Abnormal   Collection Time    03/20/14  3:51 PM      Result Value Ref Range   Glucose-Capillary 177 (*) 70 - 99 mg/dL  GLUCOSE, CAPILLARY     Status: Abnormal   Collection Time    03/20/14  8:14 PM      Result Value Ref Range   Glucose-Capillary 180 (*) 70 - 99 mg/dL   Comment 1 Documented in Chart     Comment 2 Notify RN    GLUCOSE, CAPILLARY     Status: Abnormal   Collection Time    03/20/14 11:49 PM      Result Value Ref Range   Glucose-Capillary 171 (*) 70 - 99 mg/dL   Comment 1 Documented in Chart     Comment 2  Notify RN    GLUCOSE, CAPILLARY     Status: Abnormal   Collection Time    03/21/14  4:18 AM      Result Value Ref Range   Glucose-Capillary 188 (*) 70 - 99 mg/dL   Comment 1 Documented in Chart     Comment 2 Notify RN    CBC     Status: Abnormal   Collection Time    03/21/14  4:33 AM      Result Value Ref Range   WBC 7.8  4.0 - 10.5 K/uL   RBC 2.63 (*) 4.22 - 5.81 MIL/uL   Hemoglobin 7.8 (*) 13.0 - 17.0 g/dL   HCT 40.923.9 (*) 81.139.0 - 91.452.0 %   MCV 90.9  78.0 - 100.0 fL   MCH 29.7  26.0 - 34.0 pg   MCHC 32.6  30.0 - 36.0 g/dL   RDW 78.216.8 (*) 95.611.5 - 21.315.5 %   Platelets 123 (*) 150 - 400 K/uL  BASIC METABOLIC PANEL  Status: Abnormal   Collection Time    03/21/14  4:33 AM      Result Value Ref Range   Sodium 135 (*) 137 - 147 mEq/L   Potassium 4.0  3.7 - 5.3 mEq/L   Chloride 100  96 - 112 mEq/L   CO2 21  19 - 32 mEq/L   Glucose, Bld 196 (*) 70 - 99 mg/dL   BUN 12  6 - 23 mg/dL   Creatinine, Ser 1.61  0.50 - 1.35 mg/dL   Calcium 8.2 (*) 8.4 - 10.5 mg/dL   GFR calc non Af Amer >90  >90 mL/min   GFR calc Af Amer >90  >90 mL/min   Anion gap 14  5 - 15  GLUCOSE, CAPILLARY     Status: Abnormal   Collection Time    03/21/14  7:40 AM      Result Value Ref Range   Glucose-Capillary 191 (*) 70 - 99 mg/dL    Studies/Results: No results found.    Assessment: #1. Alcohol abuse  #2. Gastrointestinal bleeding  #3. Portal hypertensive gastropathy  Plan: The patient appears clinically stable at this time. He is hungry. He wants to go home. He lives in [place]. He does not want to go back to [facility], but instead mentioned he would pursue this as an outpatient. I believe we can advance his diet, stopped his octreotide. I think he could go home. He will need to followup with his physicians in [place].    Graylin Shiver 03/21/2014, 9:03 AM  Lab Results  Component Value Date   HGB 7.8* 03/21/2014   HGB 8.0* 03/20/2014   HGB 6.9* 03/20/2014   HCT  23.9* 03/21/2014   HCT 23.9* 03/20/2014   HCT 20.7* 03/20/2014   ALKPHOS 184* 03/20/2014   ALKPHOS 230* 03/19/2014   AST 151* 03/20/2014   AST 153* 03/19/2014   ALT 29 03/20/2014   ALT 34 03/19/2014

## 2014-03-23 ENCOUNTER — Encounter (HOSPITAL_COMMUNITY): Payer: Self-pay | Admitting: Gastroenterology

## 2014-06-05 ENCOUNTER — Inpatient Hospital Stay (HOSPITAL_COMMUNITY)
Admission: EM | Admit: 2014-06-05 | Discharge: 2014-07-09 | DRG: 004 | Disposition: A | Discharge status: Home Health | Payer: BC Managed Care – PPO | Attending: Pulmonary Disease | Admitting: Pulmonary Disease

## 2014-06-05 ENCOUNTER — Encounter (HOSPITAL_COMMUNITY): Payer: Self-pay | Admitting: Emergency Medicine

## 2014-06-05 ENCOUNTER — Inpatient Hospital Stay (HOSPITAL_COMMUNITY): Payer: BC Managed Care – PPO

## 2014-06-05 DIAGNOSIS — I8500 Esophageal varices without bleeding: Secondary | ICD-10-CM | POA: Diagnosis present

## 2014-06-05 DIAGNOSIS — E1121 Type 2 diabetes mellitus with diabetic nephropathy: Secondary | ICD-10-CM | POA: Diagnosis present

## 2014-06-05 DIAGNOSIS — D62 Acute posthemorrhagic anemia: Secondary | ICD-10-CM | POA: Diagnosis present

## 2014-06-05 DIAGNOSIS — J9809 Other diseases of bronchus, not elsewhere classified: Secondary | ICD-10-CM

## 2014-06-05 DIAGNOSIS — K7290 Hepatic failure, unspecified without coma: Secondary | ICD-10-CM | POA: Diagnosis present

## 2014-06-05 DIAGNOSIS — J189 Pneumonia, unspecified organism: Secondary | ICD-10-CM | POA: Diagnosis present

## 2014-06-05 DIAGNOSIS — I471 Supraventricular tachycardia: Secondary | ICD-10-CM | POA: Diagnosis present

## 2014-06-05 DIAGNOSIS — K3189 Other diseases of stomach and duodenum: Secondary | ICD-10-CM | POA: Diagnosis present

## 2014-06-05 DIAGNOSIS — F10231 Alcohol dependence with withdrawal delirium: Secondary | ICD-10-CM | POA: Diagnosis present

## 2014-06-05 DIAGNOSIS — D684 Acquired coagulation factor deficiency: Secondary | ICD-10-CM | POA: Diagnosis present

## 2014-06-05 DIAGNOSIS — T17890A Other foreign object in other parts of respiratory tract causing asphyxiation, initial encounter: Secondary | ICD-10-CM | POA: Diagnosis present

## 2014-06-05 DIAGNOSIS — I129 Hypertensive chronic kidney disease with stage 1 through stage 4 chronic kidney disease, or unspecified chronic kidney disease: Secondary | ICD-10-CM | POA: Diagnosis present

## 2014-06-05 DIAGNOSIS — Z9289 Personal history of other medical treatment: Secondary | ICD-10-CM

## 2014-06-05 DIAGNOSIS — J90 Pleural effusion, not elsewhere classified: Secondary | ICD-10-CM

## 2014-06-05 DIAGNOSIS — Z794 Long term (current) use of insulin: Secondary | ICD-10-CM | POA: Diagnosis not present

## 2014-06-05 DIAGNOSIS — K567 Ileus, unspecified: Secondary | ICD-10-CM | POA: Diagnosis not present

## 2014-06-05 DIAGNOSIS — J9600 Acute respiratory failure, unspecified whether with hypoxia or hypercapnia: Secondary | ICD-10-CM

## 2014-06-05 DIAGNOSIS — F10239 Alcohol dependence with withdrawal, unspecified: Secondary | ICD-10-CM | POA: Diagnosis present

## 2014-06-05 DIAGNOSIS — E8770 Fluid overload, unspecified: Secondary | ICD-10-CM | POA: Diagnosis present

## 2014-06-05 DIAGNOSIS — D649 Anemia, unspecified: Secondary | ICD-10-CM | POA: Diagnosis present

## 2014-06-05 DIAGNOSIS — Z6827 Body mass index (BMI) 27.0-27.9, adult: Secondary | ICD-10-CM | POA: Diagnosis not present

## 2014-06-05 DIAGNOSIS — R0902 Hypoxemia: Secondary | ICD-10-CM

## 2014-06-05 DIAGNOSIS — D638 Anemia in other chronic diseases classified elsewhere: Secondary | ICD-10-CM | POA: Diagnosis present

## 2014-06-05 DIAGNOSIS — J9620 Acute and chronic respiratory failure, unspecified whether with hypoxia or hypercapnia: Secondary | ICD-10-CM

## 2014-06-05 DIAGNOSIS — K2970 Gastritis, unspecified, without bleeding: Secondary | ICD-10-CM | POA: Diagnosis present

## 2014-06-05 DIAGNOSIS — Z79899 Other long term (current) drug therapy: Secondary | ICD-10-CM | POA: Diagnosis not present

## 2014-06-05 DIAGNOSIS — J9621 Acute and chronic respiratory failure with hypoxia: Secondary | ICD-10-CM | POA: Diagnosis present

## 2014-06-05 DIAGNOSIS — E876 Hypokalemia: Secondary | ICD-10-CM | POA: Diagnosis not present

## 2014-06-05 DIAGNOSIS — Z930 Tracheostomy status: Secondary | ICD-10-CM

## 2014-06-05 DIAGNOSIS — Z931 Gastrostomy status: Secondary | ICD-10-CM

## 2014-06-05 DIAGNOSIS — N183 Type 2 diabetes mellitus with diabetic chronic kidney disease: Secondary | ICD-10-CM | POA: Diagnosis present

## 2014-06-05 DIAGNOSIS — R5383 Other fatigue: Secondary | ICD-10-CM | POA: Diagnosis present

## 2014-06-05 DIAGNOSIS — L03818 Cellulitis of other sites: Secondary | ICD-10-CM | POA: Diagnosis not present

## 2014-06-05 DIAGNOSIS — E663 Overweight: Secondary | ICD-10-CM | POA: Diagnosis present

## 2014-06-05 DIAGNOSIS — Y95 Nosocomial condition: Secondary | ICD-10-CM | POA: Diagnosis present

## 2014-06-05 DIAGNOSIS — E1122 Type 2 diabetes mellitus with diabetic chronic kidney disease: Secondary | ICD-10-CM | POA: Diagnosis present

## 2014-06-05 DIAGNOSIS — E46 Unspecified protein-calorie malnutrition: Secondary | ICD-10-CM

## 2014-06-05 DIAGNOSIS — E43 Unspecified severe protein-calorie malnutrition: Secondary | ICD-10-CM | POA: Diagnosis present

## 2014-06-05 DIAGNOSIS — I159 Secondary hypertension, unspecified: Secondary | ICD-10-CM

## 2014-06-05 DIAGNOSIS — Z23 Encounter for immunization: Secondary | ICD-10-CM

## 2014-06-05 DIAGNOSIS — K7682 Hepatic encephalopathy: Secondary | ICD-10-CM

## 2014-06-05 DIAGNOSIS — E872 Acidosis: Secondary | ICD-10-CM | POA: Diagnosis present

## 2014-06-05 DIAGNOSIS — E870 Hyperosmolality and hypernatremia: Secondary | ICD-10-CM | POA: Diagnosis present

## 2014-06-05 DIAGNOSIS — R059 Cough, unspecified: Secondary | ICD-10-CM

## 2014-06-05 DIAGNOSIS — D500 Iron deficiency anemia secondary to blood loss (chronic): Secondary | ICD-10-CM | POA: Diagnosis present

## 2014-06-05 DIAGNOSIS — Z9889 Other specified postprocedural states: Secondary | ICD-10-CM

## 2014-06-05 DIAGNOSIS — E1165 Type 2 diabetes mellitus with hyperglycemia: Secondary | ICD-10-CM | POA: Diagnosis present

## 2014-06-05 DIAGNOSIS — J80 Acute respiratory distress syndrome: Secondary | ICD-10-CM

## 2014-06-05 DIAGNOSIS — G9340 Encephalopathy, unspecified: Secondary | ICD-10-CM | POA: Diagnosis present

## 2014-06-05 DIAGNOSIS — K7030 Alcoholic cirrhosis of liver without ascites: Secondary | ICD-10-CM | POA: Diagnosis present

## 2014-06-05 DIAGNOSIS — I10 Essential (primary) hypertension: Secondary | ICD-10-CM | POA: Diagnosis present

## 2014-06-05 DIAGNOSIS — W19XXXA Unspecified fall, initial encounter: Secondary | ICD-10-CM

## 2014-06-05 DIAGNOSIS — J9601 Acute respiratory failure with hypoxia: Secondary | ICD-10-CM

## 2014-06-05 DIAGNOSIS — Z4659 Encounter for fitting and adjustment of other gastrointestinal appliance and device: Secondary | ICD-10-CM

## 2014-06-05 DIAGNOSIS — K760 Fatty (change of) liver, not elsewhere classified: Secondary | ICD-10-CM | POA: Diagnosis present

## 2014-06-05 DIAGNOSIS — I8510 Secondary esophageal varices without bleeding: Secondary | ICD-10-CM | POA: Diagnosis present

## 2014-06-05 DIAGNOSIS — R509 Fever, unspecified: Secondary | ICD-10-CM

## 2014-06-05 DIAGNOSIS — R05 Cough: Secondary | ICD-10-CM

## 2014-06-05 DIAGNOSIS — F1010 Alcohol abuse, uncomplicated: Secondary | ICD-10-CM | POA: Diagnosis present

## 2014-06-05 DIAGNOSIS — R740 Nonspecific elevation of levels of transaminase and lactic acid dehydrogenase [LDH]: Secondary | ICD-10-CM | POA: Diagnosis present

## 2014-06-05 DIAGNOSIS — J9690 Respiratory failure, unspecified, unspecified whether with hypoxia or hypercapnia: Secondary | ICD-10-CM

## 2014-06-05 DIAGNOSIS — E119 Type 2 diabetes mellitus without complications: Secondary | ICD-10-CM

## 2014-06-05 DIAGNOSIS — T17500A Unspecified foreign body in bronchus causing asphyxiation, initial encounter: Secondary | ICD-10-CM

## 2014-06-05 HISTORY — DX: Disease of stomach and duodenum, unspecified: K31.9

## 2014-06-05 HISTORY — DX: Alcoholic cirrhosis of liver without ascites: K70.30

## 2014-06-05 HISTORY — DX: Alcohol abuse, uncomplicated: F10.10

## 2014-06-05 HISTORY — DX: Esophageal varices without bleeding: I85.00

## 2014-06-05 LAB — COMPREHENSIVE METABOLIC PANEL
ALT: 17 U/L (ref 0–53)
AST: 90 U/L — AB (ref 0–37)
Albumin: 2.7 g/dL — ABNORMAL LOW (ref 3.5–5.2)
Alkaline Phosphatase: 188 U/L — ABNORMAL HIGH (ref 39–117)
Anion gap: 15 (ref 5–15)
BILIRUBIN TOTAL: 2 mg/dL — AB (ref 0.3–1.2)
BUN: 6 mg/dL (ref 6–23)
CO2: 21 meq/L (ref 19–32)
Calcium: 7.9 mg/dL — ABNORMAL LOW (ref 8.4–10.5)
Chloride: 106 mEq/L (ref 96–112)
Creatinine, Ser: 0.62 mg/dL (ref 0.50–1.35)
GFR calc Af Amer: >90 mL/min (ref >90)
GFR calc non Af Amer: >90 mL/min (ref >90)
Glucose, Bld: 111 mg/dL — ABNORMAL HIGH (ref 70–99)
POTASSIUM: 3.5 meq/L — AB (ref 3.7–5.3)
Sodium: 142 mEq/L (ref 137–147)
Total Protein: 7.1 g/dL (ref 6.0–8.3)

## 2014-06-05 LAB — CBC WITH DIFFERENTIAL/PLATELET
Basophils Absolute: 0.1 10*3/uL (ref 0.0–0.1)
Basophils Relative: 1 % (ref 0–1)
EOS PCT: 2 % (ref 0–5)
Eosinophils Absolute: 0.2 10*3/uL (ref 0.0–0.7)
HCT: 15.5 % — ABNORMAL LOW (ref 39.0–52.0)
Hemoglobin: 4.5 g/dL — CL (ref 13.0–17.0)
LYMPHS ABS: 1.3 10*3/uL (ref 0.7–4.0)
Lymphocytes Relative: 16 % (ref 12–46)
MCH: 21.7 pg — ABNORMAL LOW (ref 26.0–34.0)
MCHC: 29 g/dL — ABNORMAL LOW (ref 30.0–36.0)
MCV: 74.9 fL — AB (ref 78.0–100.0)
Monocytes Absolute: 0.8 10*3/uL (ref 0.1–1.0)
Monocytes Relative: 9 % (ref 3–12)
NEUTROS PCT: 72 % (ref 43–77)
Neutro Abs: 6 10*3/uL (ref 1.7–7.7)
Platelets: 157 10*3/uL (ref 150–400)
RBC: 2.07 MIL/uL — AB (ref 4.22–5.81)
RDW: 18.3 % — ABNORMAL HIGH (ref 11.5–15.5)
WBC: 8.4 10*3/uL (ref 4.0–10.5)

## 2014-06-05 LAB — PROTIME-INR
INR: 1.51 — ABNORMAL HIGH (ref 0.00–1.49)
Prothrombin Time: 18.4 seconds — ABNORMAL HIGH (ref 11.6–15.2)

## 2014-06-05 LAB — MRSA PCR SCREENING: MRSA by PCR: POSITIVE — AB

## 2014-06-05 LAB — GLUCOSE, CAPILLARY: Glucose-Capillary: 119 mg/dL — ABNORMAL HIGH (ref 70–99)

## 2014-06-05 LAB — PREPARE RBC (CROSSMATCH)

## 2014-06-05 LAB — APTT: aPTT: 45 seconds — ABNORMAL HIGH (ref 24–37)

## 2014-06-05 LAB — AMMONIA: Ammonia: 76 umol/L — ABNORMAL HIGH (ref 11–60)

## 2014-06-05 LAB — POC OCCULT BLOOD, ED: Fecal Occult Bld: POSITIVE — AB

## 2014-06-05 MED ORDER — SODIUM CHLORIDE 0.9 % IV SOLN
25.0000 ug/h | INTRAVENOUS | Status: DC
  Administered 2014-06-05: 25 ug/h via INTRAVENOUS
  Filled 2014-06-05: qty 1

## 2014-06-05 MED ORDER — SODIUM CHLORIDE 0.9 % IV SOLN
INTRAVENOUS | Status: DC
  Administered 2014-06-05 – 2014-06-06 (×2): via INTRAVENOUS

## 2014-06-05 MED ORDER — LOSARTAN POTASSIUM 50 MG PO TABS
50.0000 mg | ORAL_TABLET | Freq: Every day | ORAL | Status: DC
  Administered 2014-06-06 – 2014-06-07 (×2): 50 mg via ORAL
  Filled 2014-06-05 (×3): qty 1

## 2014-06-05 MED ORDER — PANTOPRAZOLE SODIUM 40 MG IV SOLR
40.0000 mg | Freq: Two times a day (BID) | INTRAVENOUS | Status: DC
  Administered 2014-06-05 – 2014-06-06 (×2): 40 mg via INTRAVENOUS
  Filled 2014-06-05 (×2): qty 40

## 2014-06-05 MED ORDER — INSULIN ASPART 100 UNIT/ML ~~LOC~~ SOLN
0.0000 [IU] | SUBCUTANEOUS | Status: DC
  Administered 2014-06-06: 1 [IU] via SUBCUTANEOUS

## 2014-06-05 MED ORDER — INFLUENZA VAC SPLIT QUAD 0.5 ML IM SUSY
0.5000 mL | PREFILLED_SYRINGE | INTRAMUSCULAR | Status: AC
  Administered 2014-06-06: 0.5 mL via INTRAMUSCULAR
  Filled 2014-06-05 (×2): qty 0.5

## 2014-06-05 MED ORDER — INSULIN GLARGINE 100 UNIT/ML ~~LOC~~ SOLN
15.0000 [IU] | Freq: Every day | SUBCUTANEOUS | Status: DC
  Filled 2014-06-05 (×3): qty 0.15

## 2014-06-05 MED ORDER — POLYSACCHARIDE IRON COMPLEX 150 MG PO CAPS
150.0000 mg | ORAL_CAPSULE | Freq: Every day | ORAL | Status: DC
  Administered 2014-06-06 – 2014-06-07 (×2): 150 mg via ORAL
  Filled 2014-06-05 (×4): qty 1

## 2014-06-05 MED ORDER — SODIUM CHLORIDE 0.9 % IJ SOLN
3.0000 mL | Freq: Two times a day (BID) | INTRAMUSCULAR | Status: DC
  Administered 2014-06-06 – 2014-06-09 (×8): 3 mL via INTRAVENOUS
  Administered 2014-06-10: 23:00:00 via INTRAVENOUS
  Administered 2014-06-10 – 2014-06-14 (×5): 3 mL via INTRAVENOUS
  Administered 2014-06-15: 23:00:00 via INTRAVENOUS
  Administered 2014-06-16 – 2014-07-09 (×36): 3 mL via INTRAVENOUS

## 2014-06-05 MED ORDER — PANTOPRAZOLE SODIUM 40 MG PO TBEC: 40.0000 mg | DELAYED_RELEASE_TABLET | Freq: Two times a day (BID) | ORAL | Status: DC

## 2014-06-05 MED ORDER — SODIUM CHLORIDE 0.9 % IV SOLN
Freq: Once | INTRAVENOUS | Status: AC
  Administered 2014-06-05: 16:00:00 via INTRAVENOUS

## 2014-06-05 MED ORDER — ONDANSETRON HCL 4 MG PO TABS
4.0000 mg | ORAL_TABLET | Freq: Four times a day (QID) | ORAL | Status: DC | PRN
Start: 2014-06-05 — End: 2014-06-08

## 2014-06-05 MED ORDER — ACETAMINOPHEN 650 MG RE SUPP
650.0000 mg | Freq: Four times a day (QID) | RECTAL | Status: DC | PRN
  Filled 2014-06-05: qty 1

## 2014-06-05 MED ORDER — SODIUM CHLORIDE 0.9 % IV BOLUS (SEPSIS)
1000.0000 mL | Freq: Once | INTRAVENOUS | Status: AC
  Administered 2014-06-05: 1000 mL via INTRAVENOUS

## 2014-06-05 MED ORDER — MUPIROCIN 2 % EX OINT
1.0000 "application " | TOPICAL_OINTMENT | Freq: Two times a day (BID) | CUTANEOUS | Status: AC
  Administered 2014-06-05 – 2014-06-10 (×10): 1 via NASAL
  Filled 2014-06-05 (×3): qty 22

## 2014-06-05 MED ORDER — ALBUTEROL SULFATE (2.5 MG/3ML) 0.083% IN NEBU
2.5000 mg | INHALATION_SOLUTION | Freq: Four times a day (QID) | RESPIRATORY_TRACT | Status: DC | PRN
  Administered 2014-06-06 – 2014-06-07 (×2): 2.5 mg via RESPIRATORY_TRACT
  Filled 2014-06-05 (×3): qty 3

## 2014-06-05 MED ORDER — ACETAMINOPHEN 325 MG PO TABS
650.0000 mg | ORAL_TABLET | Freq: Four times a day (QID) | ORAL | Status: DC | PRN
  Administered 2014-06-07 (×2): 650 mg via ORAL
  Filled 2014-06-05 (×3): qty 2

## 2014-06-05 MED ORDER — CHLORHEXIDINE GLUCONATE CLOTH 2 % EX PADS
6.0000 | MEDICATED_PAD | Freq: Every day | CUTANEOUS | Status: AC
  Administered 2014-06-06 – 2014-06-10 (×4): 6 via TOPICAL

## 2014-06-05 MED ORDER — POTASSIUM CHLORIDE CRYS ER 20 MEQ PO TBCR: 40.0000 meq | EXTENDED_RELEASE_TABLET | Freq: Once | ORAL | Status: DC

## 2014-06-05 MED ORDER — SODIUM CHLORIDE 0.9 % IV SOLN
Freq: Once | INTRAVENOUS | Status: DC
Start: 2014-06-05 — End: 2014-06-08

## 2014-06-05 MED ORDER — METOPROLOL TARTRATE 25 MG PO TABS
25.0000 mg | ORAL_TABLET | Freq: Two times a day (BID) | ORAL | Status: DC
  Administered 2014-06-05 – 2014-06-07 (×5): 25 mg via ORAL
  Filled 2014-06-05 (×6): qty 1

## 2014-06-05 MED ORDER — SODIUM CHLORIDE 0.9 % IV SOLN
25.0000 ug/h | INTRAVENOUS | Status: DC
  Administered 2014-06-05: 25 ug/h via INTRAVENOUS
  Filled 2014-06-05 (×2): qty 1

## 2014-06-05 MED ORDER — PANTOPRAZOLE SODIUM 40 MG IV SOLR
40.0000 mg | Freq: Once | INTRAVENOUS | Status: AC
  Administered 2014-06-05: 40 mg via INTRAVENOUS
  Filled 2014-06-05: qty 40

## 2014-06-05 MED ORDER — OCTREOTIDE LOAD VIA INFUSION
50.0000 ug | Freq: Once | INTRAVENOUS | Status: AC
  Administered 2014-06-05: 50 ug via INTRAVENOUS
  Filled 2014-06-05: qty 25

## 2014-06-05 MED ORDER — MAGNESIUM OXIDE 400 MG PO TABS
400.0000 mg | ORAL_TABLET | Freq: Two times a day (BID) | ORAL | Status: DC
  Administered 2014-06-05 – 2014-06-07 (×5): 400 mg via ORAL
  Filled 2014-06-05 (×9): qty 1

## 2014-06-05 MED ORDER — ONDANSETRON HCL 4 MG/2ML IJ SOLN: 4.0000 mg | Freq: Four times a day (QID) | INTRAMUSCULAR | Status: DC | PRN

## 2014-06-05 MED ORDER — PNEUMOCOCCAL VAC POLYVALENT 25 MCG/0.5ML IJ INJ
0.5000 mL | INJECTION | INTRAMUSCULAR | Status: AC
  Administered 2014-06-06: 0.5 mL via INTRAMUSCULAR
  Filled 2014-06-05 (×2): qty 0.5

## 2014-06-05 NOTE — ED Notes (Signed)
MD at bedside. 

## 2014-06-05 NOTE — ED Notes (Signed)
Pt reports hh of alcoholism, sts last drink was two days ago, pt does endorse hx of "violent withdrawals" with nausea,vomiting, tremors, anxiety. PA Josh was notified.

## 2014-06-05 NOTE — Consult Note (Signed)
[facility] Consultation [facility]  Referring Provider:  Dr. Eddie NorthNishant Dhungel [facility] Primary Care Physician:  No PCP Per Patient  Reason for Consultation:  anemia  HPI: Dion BodyWilliam Whitelock is a 38 y.o. male admitted for anemia.  Seen by nephrologist recently and found to have Hgb 4.  He has had a couple weeks' worth of progressive fatigue and poor energy.  No abdominal pain, hematemesis, nausea, vomiting, change in bowel habits, melena, hematochezia.  He has history of alcohol-mediated cirrhosis, and was admitted for anemia and bleeding in late July, and had endoscopy that showed non-bleeding esophageal varices as well as portal gastropathy.  He continues to drink alcohol, 4-5 drinks every couple days.  He was discharged home on oral iron, which he doesn't regularly take.   Past Medical History  Diagnosis Date  . Diabetes mellitus without complication   . Hypertension     Past Surgical History  Procedure Laterality Date  . Esophagogastroduodenoscopy N/A 03/20/2014    Procedure: ESOPHAGOGASTRODUODENOSCOPY (EGD);  Surgeon: Shirley FriarVincent C. Schooler, MD;  [facility];  Service: Endoscopy;  Laterality: N/A;    Prior to Admission medications   Medication Sig Start Date End Date Taking? Authorizing Provider  albuterol (PROVENTIL HFA;VENTOLIN HFA) 108 (90 BASE) MCG/ACT inhaler Inhale 1-2 puffs into the lungs every 6 (six) hours as needed for wheezing or shortness of breath (shortness of breath).   Yes Historical Provider, MD  dexlansoprazole (DEXILANT) 60 MG capsule Take 60 mg by mouth daily.   Yes Historical Provider, MD  insulin glargine (LANTUS) 100 UNIT/ML injection Inject 15 Units into the skin daily with breakfast.   Yes Historical Provider, MD  LORazepam (ATIVAN) 1 MG tablet Take 1 mg by mouth 2 (two) times daily as needed for anxiety (anxiety).    Yes Historical Provider, MD  losartan (COZAAR) 50 MG tablet Take 50 mg by mouth daily.   Yes Historical Provider, MD  magnesium oxide  (MAG-OX) 400 MG tablet Take 400 mg by mouth 2 (two) times daily.   Yes Historical Provider, MD  metoprolol tartrate (LOPRESSOR) 25 MG tablet Take 25 mg by mouth 2 (two) times daily.   Yes Historical Provider, MD    Current Facility-Administered Medications  Medication Dose Route Frequency Provider Last Rate Last Dose  . 0.9 %  sodium chloride infusion   Intravenous Once Nishant Dhungel, MD      . Melene Muller[START ON 06/06/2014] Influenza vac split quadrivalent PF (FLUARIX) injection 0.5 mL  0.5 mL Intramuscular Tomorrow-1000 Nishant Dhungel, MD      . octreotide (SANDOSTATIN) 500 mcg in sodium chloride 0.9 % 250 mL (2 mcg/mL) infusion  25-50 mcg/hr Intravenous Continuous Renne CriglerJoshua Geiple, PA-C      . [START ON 06/06/2014] pneumococcal 23 valent vaccine (PNU-IMMUNE) injection 0.5 mL  0.5 mL Intramuscular Tomorrow-1000 Nishant Dhungel, MD      . potassium chloride SA (K-DUR,KLOR-CON) CR tablet 40 mEq  40 mEq Oral Once Eddie NorthNishant Dhungel, MD       Current Outpatient Prescriptions  Medication Sig Dispense Refill  . albuterol (PROVENTIL HFA;VENTOLIN HFA) 108 (90 BASE) MCG/ACT inhaler Inhale 1-2 puffs into the lungs every 6 (six) hours as needed for wheezing or shortness of breath (shortness of breath).      Marland Kitchen. dexlansoprazole (DEXILANT) 60 MG capsule Take 60 mg by mouth daily.      . insulin glargine (LANTUS) 100 UNIT/ML injection Inject 15 Units into the skin daily with breakfast.      . LORazepam (ATIVAN) 1 MG tablet Take 1  mg by mouth 2 (two) times daily as needed for anxiety (anxiety).       Marland Kitchen. losartan (COZAAR) 50 MG tablet Take 50 mg by mouth daily.      . magnesium oxide (MAG-OX) 400 MG tablet Take 400 mg by mouth 2 (two) times daily.      . metoprolol tartrate (LOPRESSOR) 25 MG tablet Take 25 mg by mouth 2 (two) times daily.        Allergies as of 06/05/2014  . (No Known Allergies)    History reviewed. No pertinent family history.  History   Social History  . Marital Status: Single    Spouse Name:  N/A    Number of Children: N/A  . Years of Education: N/A   Occupational History  . Not on file.   Social History Main Topics  . Smoking status: Never Smoker   . Smokeless tobacco: Never Used  . Alcohol Use: Yes     Comment: pint daily-in rehab as of yesterday (03/18/14)  . Drug Use: No  . Sexual Activity: Not on file   Other Topics Concern  . Not on file   Social History Narrative  . No narrative on file    Review of Systems: ROS Dr. Gonzella Lexhungel 06/05/14 reviewed and I agree  Physical Exam: Vital signs in last 24 hours: Temp:  [98.7 F (37.1 C)-99.2 F (37.3 C)] 99.1 F (37.3 C) (10/09 1552) Pulse Rate:  [100-117] 116 (10/09 1615) Resp:  [17-28] 22 (10/09 1546) BP: (116-143)/(60-76) 132/73 mmHg (10/09 1615) SpO2:  [93 %-100 %] 100 % (10/09 1615)   General:   Alert,  Overweight, Well-developed, well-nourished, pleasant and cooperative in NAD Head:  Normocephalic and atraumatic. Eyes:  Sclera clear, faint icterus bilaterally.   Conjunctiva pale Ears:  Normal auditory acuity. Nose:  No deformity, discharge,  or lesions. Mouth:  No deformity or lesions.  Oropharynx pale and dry Neck:  Supple; no masses or thyromegaly. Lungs:  Clear throughout to auscultation.   No wheezes, crackles, or rhonchi. No acute distress. Heart:  Regular rate and rhythm; no murmurs, clicks, rubs,  or gallops. Abdomen:  Soft, nontender and nondistended. No masses, hepatosplenomegaly or hernias noted. Normal bowel sounds, without guarding, and without rebound.     Msk:  Symmetrical without gross deformities. Normal posture. Pulses:  Normal pulses noted. Extremities:  Without clubbing or edema. Neurologic:  Alert and  oriented x4;  Diffusely weak, otherwise grossly normal neurologically. Skin:  Pale, otherwise Intact without significant lesions or rashes. Psych:  Alert and cooperative. Normal mood and affect.   Lab Results:  Recent Labs  06/05/14 1319  WBC 8.4  HGB 4.5*  HCT 15.5*  PLT 157    BMET  Recent Labs  06/05/14 1319  NA 142  K 3.5*  CL 106  CO2 21  GLUCOSE 111*  BUN 6  CREATININE 0.62  CALCIUM 7.9*   LFT  Recent Labs  06/05/14 1319  PROT 7.1  ALBUMIN 2.7*  AST 90*  ALT 17  ALKPHOS 188*  BILITOT 2.0*   PT/INR  Recent Labs  06/05/14 1319  LABPROT 18.4*  INR 1.51*    Studies/Results: No results found.  Impression:  1.  Alcohol-mediated cirrhosis.  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 2.  Anemia.  Suspect slow bleeding from portal gastropathy; no overt bleeding.  May have component of alcohol-mediated gastritis as well; this, in conjunction with his mild coagulopathy and pre-existing portal gastropathy, as well as poor compliance with oral iron therapy, could  lead to progressive anemia.  Plan:  1.  No alcohol.  I had long discussion with patient and made it clear that continued alcohol use will significantly shorten his lifespan.  Patient is aware that, while he is not in need of a liver transplant at the present time, he will not be a candidate into the future unless/until he has a six-month alcohol-free window. 2.  Oral iron therapy. 3.  Pantoprazole 40 mg po bid. 4.  Blood transfusions as needed. 5.  Octreotide per hospitalist; might consider transition to propranolol tomorrow. 6.  Don't see need for endoscopic evaluation at this time; if patient has recurrent drop in hemoglobin despite no alcohol, compliance with PPI and oral iron therapy, then might need to consider elective repeat endoscopy (and perhaps even colonoscopy) for further evaluation. 7.  [facility] will follow.   LOS: 0 days   Dezyre Hoefer,Takao M  06/05/2014, 4:51 PM

## 2014-06-05 NOTE — ED Provider Notes (Signed)
CSN: [id number]     Arrival date & time 06/05/14  1214 History   First MD Initiated Contact with Patient 06/05/14 1219     Chief Complaint  Patient presents with  . Hgb 4      (Consider location/radiation/quality/duration/timing/severity/associated sxs/prior Treatment) HPI Comments: Patient with chronic alcohol use constipated by cirrhosis and esophageal varices, blood loss anemia due to variceal bleeding, hypertension, diabetes -- presents with anemia. Patient states that he had his blood work checked [place] and was told that his hemoglobin was 4 and to come to the hospital. Patient admits to feeling weak recently. He also has been cold. He denies lightheadedness or syncope at rest or with standing. He does not currently have any chest pain or shortness of breath. He does not have nausea, vomiting, hematemesis. Patient states that his stools have been dark but not black. No abdominal pain or urinary symptoms. Patient is prescribed iron but admits to not taking these as often as he should. Previous endoscopy 03/20/14 showing esophageal varices.   The history is provided by the patient and medical records.    Past Medical History  Diagnosis Date  . Diabetes mellitus without complication   . Hypertension    Past Surgical History  Procedure Laterality Date  . Esophagogastroduodenoscopy N/A 03/20/2014    Procedure: ESOPHAGOGASTRODUODENOSCOPY (EGD);  Surgeon: Shirley FriarVincent C. Schooler, MD;  [facility];  Service: Endoscopy;  Laterality: N/A;   No family history on file. History  Substance Use Topics  . Smoking status: Never Smoker   . Smokeless tobacco: Not on file  . Alcohol Use: Yes     Comment: pint daily-in rehab as of yesterday (03/18/14)    Review of Systems  Constitutional: Positive for chills and fatigue. Negative for fever.  HENT: Negative for rhinorrhea and sore throat.   Eyes: Negative for redness.  Respiratory: Negative for cough and  shortness of breath.   Cardiovascular: Negative for chest pain.  Gastrointestinal: Negative for nausea, vomiting, abdominal pain and diarrhea.  Genitourinary: Negative for dysuria.  Musculoskeletal: Negative for myalgias.  Skin: Negative for rash.  Neurological: Negative for headaches.    Allergies  Review of patient's allergies indicates no known allergies.  Home Medications   Prior to Admission medications   Medication Sig Start Date End Date Taking? Authorizing Provider  albuterol (PROVENTIL HFA;VENTOLIN HFA) 108 (90 BASE) MCG/ACT inhaler Inhale 1-2 puffs into the lungs every 6 (six) hours as needed for wheezing or shortness of breath (shortness of breath).   Yes Historical Provider, MD  dexlansoprazole (DEXILANT) 60 MG capsule Take 60 mg by mouth daily.   Yes Historical Provider, MD  insulin glargine (LANTUS) 100 UNIT/ML injection Inject 15 Units into the skin daily with breakfast.   Yes Historical Provider, MD  LORazepam (ATIVAN) 1 MG tablet Take 1 mg by mouth 2 (two) times daily as needed for anxiety (anxiety).    Yes Historical Provider, MD  losartan (COZAAR) 50 MG tablet Take 50 mg by mouth daily.   Yes Historical Provider, MD  magnesium oxide (MAG-OX) 400 MG tablet Take 400 mg by mouth 2 (two) times daily.   Yes Historical Provider, MD  metoprolol tartrate (LOPRESSOR) 25 MG tablet Take 25 mg by mouth 2 (two) times daily.   Yes Historical Provider, MD   BP 118/65  Pulse 111  Temp(Src) 99.2 F (37.3 C) (Oral)  Resp 28  SpO2 94%  Physical Exam  Nursing note and vitals reviewed. Constitutional: He appears well-developed  and well-nourished.  HENT:  Head: Normocephalic and atraumatic.  Mouth/Throat: Oropharynx is clear and moist.  Eyes: Pupils are equal, round, and reactive to light. Right eye exhibits no discharge. Left eye exhibits no discharge.  Conjunctival pallor  Neck: Normal range of motion. Neck supple.  Cardiovascular: Regular rhythm and normal heart sounds.   Tachycardia present.   No murmur heard. Pulmonary/Chest: Effort normal and breath sounds normal. No respiratory distress. He has no wheezes. He has no rales.  Abdominal: Soft. There is no tenderness. There is no rebound and no guarding.  Genitourinary: Rectal exam shows no external hemorrhoid, no internal hemorrhoid, no mass and no tenderness. Guaiac positive stool.  Musculoskeletal: He exhibits no edema and no tenderness.  Neurological: He is alert.  Skin: Skin is warm and dry.  Psychiatric: He has a normal mood and affect.    ED Course  Procedures (including critical care time) Labs Review Labs Reviewed  CBC WITH DIFFERENTIAL - Abnormal; Notable for the following:    RBC 2.07 (*)    Hemoglobin 4.5 (*)    HCT 15.5 (*)    MCV 74.9 (*)    MCH 21.7 (*)    MCHC 29.0 (*)    RDW 18.3 (*)    All other components within normal limits  COMPREHENSIVE METABOLIC PANEL - Abnormal; Notable for the following:    Potassium 3.5 (*)    Glucose, Bld 111 (*)    Calcium 7.9 (*)    Albumin 2.7 (*)    AST 90 (*)    Alkaline Phosphatase 188 (*)    Total Bilirubin 2.0 (*)    All other components within normal limits  PROTIME-INR - Abnormal; Notable for the following:    Prothrombin Time 18.4 (*)    INR 1.51 (*)    All other components within normal limits  APTT - Abnormal; Notable for the following:    aPTT 45 (*)    All other components within normal limits  AMMONIA - Abnormal; Notable for the following:    Ammonia 76 (*)    All other components within normal limits  POC OCCULT BLOOD, ED - Abnormal; Notable for the following:    Fecal Occult Bld POSITIVE (*)    All other components within normal limits  TYPE AND SCREEN  PREPARE RBC (CROSSMATCH)    Imaging Review No results found.   EKG Interpretation None      Patient seen and examined. Work-up initiated. Medications ordered including fluid bolus and type and screen. Pt is stable, normotensive, mild tachycardia. No apparent active  bleeding. Discussed with Dr. Fayrene FearingJames who will see.   Vital signs reviewed and are as follows: BP 118/65  Pulse 111  Temp(Src) 99.2 F (37.3 C) (Oral)  Resp 28  SpO2 94%  3:38 PM Spoke with [facility]. Requests octreotide drip. Will ask GI to consult ([facility] admission).   3:55 PM Spoke with Dr. Dulce Sellarutlaw. [facility] will consult.   MDM   Final diagnoses:  Blood loss anemia  Alcoholic cirrhosis of liver without ascites   Will admit to stepdown.     Renne CriglerJoshua Najma Bozarth, PA-C 06/05/14 1541  Renne CriglerJoshua Phallon Haydu, PA-C 06/05/14 1556

## 2014-06-05 NOTE — Progress Notes (Signed)
  CARE MANAGEMENT ED NOTE 06/05/2014  Patient:  Jerome [name],Jerome [name]   Account Number:  [id number]  Date Initiated:  06/05/2014  Documentation initiated by:  Radford PaxFERRERO,Senan Urey  Subjective/Objective Assessment:   Patient presents to ED with Hmg of 4 per patient's pcp     Subjective/Objective Assessment Detail:   Patient with pmhx of alcohol abuse, HTN, DM chronic kidney disease stage 3.  Hmg 4.5     Action/Plan:   GI consult   Action/Plan Detail:   Anticipated DC Date:       Status Recommendation to Physician:   Result of Recommendation:    Other ED Services  Consult Working Plan    DC Planning Services  Other  PCP issues    Choice offered to / List presented to:            Status of service:  Completed, signed off  ED Comments:   ED Comments Detail:  EDCM spoke to patient at bedisde.  Patient confirms his pcp is Dr. Lorelei PontMark Mahoney in [place].  Phone number and address for pcp provided by family members at bedside. System updated.

## 2014-06-05 NOTE — ED Notes (Signed)
Pt has hx of GI bleed.  Got a call from his doctor saying his hgb was 4.  Denies passing any blood now.

## 2014-06-05 NOTE — H&P (Addendum)
[facility] History and Physical  Dion BodyWilliam Noon ZOX:[id number] DOB: 01/30/1976 DOA: 06/05/2014  Referring physician: ED [place]  Chief Complaint:  Sent by outpatient physician for low hemoglobin   HPI:  38 year old alcoholic male with history of hypertension, diabetes mellitus, chronic kidney disease stage  3 who was sent to the ED by his Nephrologist when he had blood work done yesterday and was found to have hemoglobin of 4. Patient was admitted about [facility] back [facility] he was getting detox from alcohol and was found to have low hemoglobin. Patient received 2 unit PRBC , was seen by [facility] and underwent EGD which showed nonbleeding distal esophageal varices, gastritis and portal gastropathy. Patient was discharged home and was supposed to followup with his PCP in gastroenterology in  [place]. Patient reports seeing his PCP once after discharge but did not have any followup lab work . He reports that he was drinking every day  but has cut down to drinking every other day and drinks about 5-to 6 ounces of vodka . He reports having hand tremors on the days he doesn't drink. He informs feeling fatigued and tired for about 7 days. Also reports poor appetite. He denies any headache, dizziness, blurred vision, chest pain, palpitations, fevers, chills, nausea, vomiting, hematemesis, abdominal pain or distention, dysuria, change in color of his elevated or stool, melena or leg swellings. Denies any change in his weight. He denies any confusion.  In the ED patient was tachycardic to 100 pains, afebrile. Blood work done showed hemoglobin of 4.5 and hematocrit of 15.5. Chemistry showed potassium of 3.5, total bilirubin of 2, AST of 90 and alkaline phosphatase of 188. Albumin is 2.7. INR was 1.51. Patient given 40 mg IV Protonix, ordered for 2 UPRBC and octreotide drip. Hospitalists admission requested. He goes GI consulted.  Review of Systems:   Constitutional: Denies fever, chills, diaphoresis, appetite change and fatigue.  HEENT: Denies visual or hearing symptoms,  trouble swallowing, neck pain,  Respiratory: Denies SOB, DOE, cough, chest tightness,  and wheezing.   Cardiovascular: Denies chest pain, palpitations and leg swelling.  Gastrointestinal: Denies nausea, vomiting, abdominal pain, diarrhea, constipation, blood in stool and abdominal distention.  Genitourinary: Denies dysuria, hematuria, flank pain and difficulty urinating.  Endocrine: Denies: hot or cold intolerance, polyuria, polydipsia. Musculoskeletal: Denies myalgias, back pain, Skin: Denies pallor, rash and wound.  Neurological: Denies dizziness, seizures, syncope, weakness, light-headedness, numbness and headaches.  Psychiatric/Behavioral: Denies confusion, nervousness, sleep disturbance and agitation   Past Medical History  Diagnosis Date  . Diabetes mellitus without complication   . Hypertension    Past Surgical History  Procedure Laterality Date  . Esophagogastroduodenoscopy N/A 03/20/2014    Procedure: ESOPHAGOGASTRODUODENOSCOPY (EGD);  Surgeon: Shirley FriarVincent C. Schooler, MD;  [facility];  Service: Endoscopy;  Laterality: N/A;   Social History:  reports that he has never smoked. He has never used smokeless tobacco. He reports that he drinks alcohol. He reports that he does not use illicit drugs.  No Known Allergies  History reviewed. No pertinent family history.  Prior to Admission medications   Medication Sig Start Date End Date Taking? Authorizing Provider  albuterol (PROVENTIL HFA;VENTOLIN HFA) 108 (90 BASE) MCG/ACT inhaler Inhale 1-2 puffs into the lungs every 6 (six) hours as needed for wheezing or shortness of breath (shortness of breath).   Yes Historical Provider, MD  dexlansoprazole (DEXILANT) 60 MG capsule Take 60 mg by mouth daily.   Yes Historical Provider, MD  insulin  glargine (LANTUS) 100 UNIT/ML injection Inject 15 Units into the  skin daily with breakfast.   Yes Historical Provider, MD  LORazepam (ATIVAN) 1 MG tablet Take 1 mg by mouth 2 (two) times daily as needed for anxiety (anxiety).    Yes Historical Provider, MD  losartan (COZAAR) 50 MG tablet Take 50 mg by mouth daily.   Yes Historical Provider, MD  magnesium oxide (MAG-OX) 400 MG tablet Take 400 mg by mouth 2 (two) times daily.   Yes Historical Provider, MD  metoprolol tartrate (LOPRESSOR) 25 MG tablet Take 25 mg by mouth 2 (two) times daily.   Yes Historical Provider, MD     Physical Exam:  Filed Vitals:   06/05/14 1546 06/05/14 1552 06/05/14 1600 06/05/14 1615  BP: 143/66  137/69 132/73  Pulse: 102  102 116  Temp: 98.9 F (37.2 C) 99.1 F (37.3 C)    TempSrc: Oral Oral    Resp: 22     SpO2: 95%  95% 100%    Constitutional: Vital signs reviewed.  Middle aged pale appearing male in no acute distress. HEENT: Pallor present, icteric, moist oral mucosa, no cervical lymphadenopathy Cardiovascular: RRR, S1 normal, S2 normal, no MRG Chest: CTAB, no wheezes, rales, or rhonchi Abdominal: Soft. Non-tender, mild distention, nontender, bowel sounds present  Ext: warm, trace pitting edema  Neurological: Alert and oriented, fine tremors  Labs on Admission:  Basic Metabolic Panel:  Recent Labs Lab 06/05/14 1319  NA 142  K 3.5*  CL 106  CO2 21  GLUCOSE 111*  BUN 6  CREATININE 0.62  CALCIUM 7.9*   Liver Function Tests:  Recent Labs Lab 06/05/14 1319  AST 90*  ALT 17  ALKPHOS 188*  BILITOT 2.0*  PROT 7.1  ALBUMIN 2.7*   No results found for this basename: LIPASE, AMYLASE,  in the last 168 hours  Recent Labs Lab 06/05/14 1322  AMMONIA 76*   CBC:  Recent Labs Lab 06/05/14 1319  WBC 8.4  NEUTROABS 6.0  HGB 4.5*  HCT 15.5*  MCV 74.9*  PLT 157   Cardiac Enzymes: No results found for this basename: CKTOTAL, CKMB, CKMBINDEX, TROPONINI,  in the last 168 hours BNP: No components found with this basename: POCBNP,  CBG: No results  found for this basename: GLUCAP,  in the last 168 hours  Radiological Exams on Admission: No results found.  EKG: Normal sinus rhythm, no ST-T changes  Assessment/Plan  Principal Problem:   Acute blood loss anemia Secondary to acute on chronic GI bleed possibly bleeding esophageal varices versus gastritis with underlying active alcohol use. -Admit to step down -H&H every 6 hours. Transfuse 3 unit PRBC -Placed on octreotide drip. IV Protonix every 12 hours -[facility] consulted. Keep patient n.p.o. for now. Recent upper GI endoscopy showing portal gastropathy with nonbleeding distal esophageal varices and erosive gastritis.  Active Problems:  Alcohol abuse with mild withdrawal symptoms Patient has mild tremulousness on exam. Has cut down to drinking every other day and drinks 5-6 ounces of vodka. Reports shaking of his hands on the days he is not drinking. He reports working with a therapist as outpatient for alcohol cessation. -High risk for DTs. Placed on CIWA and monitor    Cirrhosis of the liver Supported by transaminitis and mild coagulopathy. Repeat US abdomen    Hypertension Resume home medications     Protein-calorie malnutrition, severe Nutrition consult   Fatty liver with possible EtOH cirrhosis Patient jaundiced on exam with mild transaminitis. We'll repeat liver ultrasound  Diabetes mellitus, uncontrolled with nephropathy Continue home dose of Lantus and place on sliding scale insulin every 4 hours while patient n.p.o. 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  Renal function is normal. Sees outpatient nephrologist  Patient counseled extensively on cessation of alcohol.  Diet: N.p.o. DVT prophylaxis: SCDs   Code Status:full code Family Communication: discussed with patient and his parents at bedside Disposition Plan: Admit to step down  Eddie NorthDHUNGEL, Lejuan Botto [facility] Pager [phone]  Total time spent on admission :70 minutes  If 7PM-7AM, please contact night-coverage [email or web address] Password [email or web address] 06/05/2014, 4:20 PM

## 2014-06-05 NOTE — ED Notes (Signed)
Ultrasound at bedside

## 2014-06-05 NOTE — ED Notes (Signed)
Admitting MD at bedside.

## 2014-06-06 DIAGNOSIS — D500 Iron deficiency anemia secondary to blood loss (chronic): Secondary | ICD-10-CM

## 2014-06-06 LAB — CBC
HCT: 23.3 % — ABNORMAL LOW (ref 39.0–52.0)
HEMATOCRIT: 21.1 % — AB (ref 39.0–52.0)
HEMOGLOBIN: 6.4 g/dL — AB (ref 13.0–17.0)
Hemoglobin: 7.1 g/dL — ABNORMAL LOW (ref 13.0–17.0)
MCH: 23.9 pg — AB (ref 26.0–34.0)
MCH: 24.2 pg — AB (ref 26.0–34.0)
MCHC: 30.3 g/dL (ref 30.0–36.0)
MCHC: 30.5 g/dL (ref 30.0–36.0)
MCV: 78.7 fL (ref 78.0–100.0)
MCV: 79.5 fL (ref 78.0–100.0)
PLATELETS: 167 10*3/uL (ref 150–400)
Platelets: 155 10*3/uL (ref 150–400)
RBC: 2.68 MIL/uL — AB (ref 4.22–5.81)
RBC: 2.93 MIL/uL — AB (ref 4.22–5.81)
RDW: 17.5 % — ABNORMAL HIGH (ref 11.5–15.5)
RDW: 17.5 % — AB (ref 11.5–15.5)
WBC: 8.8 10*3/uL (ref 4.0–10.5)
WBC: 10.5 10*3/uL (ref 4.0–10.5)

## 2014-06-06 LAB — COMPREHENSIVE METABOLIC PANEL
ALK PHOS: 174 U/L — AB (ref 39–117)
ALT: 16 U/L (ref 0–53)
ANION GAP: 14 (ref 5–15)
AST: 96 U/L — ABNORMAL HIGH (ref 0–37)
Albumin: 2.7 g/dL — ABNORMAL LOW (ref 3.5–5.2)
BUN: 6 mg/dL (ref 6–23)
CALCIUM: 7.3 mg/dL — AB (ref 8.4–10.5)
CO2: 20 meq/L (ref 19–32)
Chloride: 109 mEq/L (ref 96–112)
Creatinine, Ser: 0.78 mg/dL (ref 0.50–1.35)
GFR calc non Af Amer: >90 mL/min (ref >90)
GLUCOSE: 129 mg/dL — AB (ref 70–99)
POTASSIUM: 3.8 meq/L (ref 3.7–5.3)
Sodium: 143 mEq/L (ref 137–147)
TOTAL PROTEIN: 6.9 g/dL (ref 6.0–8.3)
Total Bilirubin: 3.1 mg/dL — ABNORMAL HIGH (ref 0.3–1.2)

## 2014-06-06 LAB — GLUCOSE, CAPILLARY
GLUCOSE-CAPILLARY: 117 mg/dL — AB (ref 70–99)
GLUCOSE-CAPILLARY: 105 mg/dL — AB (ref 70–99)
Glucose-Capillary: 109 mg/dL — ABNORMAL HIGH (ref 70–99)
Glucose-Capillary: 133 mg/dL — ABNORMAL HIGH (ref 70–99)
Glucose-Capillary: 96 mg/dL (ref 70–99)
Glucose-Capillary: 109 mg/dL — ABNORMAL HIGH (ref 70–99)

## 2014-06-06 LAB — PREPARE RBC (CROSSMATCH)

## 2014-06-06 MED ORDER — BENZONATATE 100 MG PO CAPS
100.0000 mg | ORAL_CAPSULE | Freq: Three times a day (TID) | ORAL | Status: DC | PRN
  Administered 2014-06-06 – 2014-06-07 (×3): 100 mg via ORAL
  Filled 2014-06-06 (×4): qty 1

## 2014-06-06 MED ORDER — PANTOPRAZOLE SODIUM 40 MG PO TBEC
40.0000 mg | DELAYED_RELEASE_TABLET | Freq: Two times a day (BID) | ORAL | Status: DC
  Administered 2014-06-06 – 2014-06-07 (×3): 40 mg via ORAL
  Filled 2014-06-06 (×4): qty 1

## 2014-06-06 MED ORDER — LORAZEPAM 2 MG/ML IJ SOLN
INTRAMUSCULAR | Status: AC
  Filled 2014-06-06: qty 1

## 2014-06-06 MED ORDER — SODIUM CHLORIDE 0.9 % IV SOLN
Freq: Once | INTRAVENOUS | Status: AC
Start: 2014-06-06 — End: 2014-06-06
  Administered 2014-06-06: 15:00:00 via INTRAVENOUS

## 2014-06-06 MED ORDER — LORAZEPAM 2 MG/ML IJ SOLN
1.0000 mg | Freq: Once | INTRAMUSCULAR | Status: AC
  Administered 2014-06-06: 1 mg via INTRAVENOUS

## 2014-06-06 MED ORDER — LORAZEPAM 2 MG/ML IJ SOLN
2.0000 mg | INTRAMUSCULAR | Status: DC | PRN
  Administered 2014-06-06 – 2014-06-07 (×8): 2 mg via INTRAVENOUS
  Filled 2014-06-06 (×8): qty 1

## 2014-06-06 MED ORDER — INSULIN ASPART 100 UNIT/ML ~~LOC~~ SOLN: 0.0000 [IU] | Freq: Three times a day (TID) | SUBCUTANEOUS | Status: DC

## 2014-06-06 MED ORDER — SODIUM CHLORIDE 0.9 % IV SOLN: Freq: Once | INTRAVENOUS | Status: DC

## 2014-06-06 NOTE — Progress Notes (Signed)
Subjective: No abdominal pain. No blood in stool.  Objective: Vital signs in last 24 hours: Temp:  [98.4 F (36.9 C)-99.8 F (37.7 C)] 98.5 F (36.9 C) (10/10 0800) Pulse Rate:  [88-117] 95 (10/10 0800) Resp:  [17-31] 31 (10/10 0800) BP: (116-148)/(55-88) 130/72 mmHg (10/10 0800) SpO2:  [90 %-100 %] 98 % (10/10 0800) Weight:  [94.4 kg (208 lb 1.8 oz)] 94.4 kg (208 lb 1.8 oz) (10/09 1810) Weight change:  Last BM Date: 06/05/14  PE: GEN:  NAD, not encephalopathic HEENT:  Scleral icterus, conjunctiva pale ABD:  Mild distended, no obvious ascites, palpable hepatomegaly EXT:  No edema  Lab Results: CBC    Component Value Date/Time   WBC 8.8 06/06/2014 0555   RBC 2.68* 06/06/2014 0555   HGB 6.4* 06/06/2014 0555   HCT 21.1* 06/06/2014 0555   PLT 155 06/06/2014 0555   MCV 78.7 06/06/2014 0555   MCH 23.9* 06/06/2014 0555   MCHC 30.3 06/06/2014 0555   RDW 17.5* 06/06/2014 0555   LYMPHSABS 1.3 06/05/2014 1319   MONOABS 0.8 06/05/2014 1319   EOSABS 0.2 06/05/2014 1319   BASOSABS 0.1 06/05/2014 1319   CMP     Component Value Date/Time   NA 143 06/06/2014 0555   K 3.8 06/06/2014 0555   CL 109 06/06/2014 0555   CO2 20 06/06/2014 0555   GLUCOSE 129* 06/06/2014 0555   BUN 6 06/06/2014 0555   CREATININE 0.78 06/06/2014 0555   CALCIUM 7.3* 06/06/2014 0555   PROT 6.9 06/06/2014 0555   ALBUMIN 2.7* 06/06/2014 0555   AST 96* 06/06/2014 0555   ALT 16 06/06/2014 0555   ALKPHOS 174* 06/06/2014 0555   BILITOT 3.1* 06/06/2014 0555   GFRNONAA >90 06/06/2014 0555   GFRAA >90 06/06/2014 0555   INR 1.5  Assessment:  1. Hemoccult-positive anemia.  No overt bleeding.  Suspect from portal gastropathy with likely contribution from alcohol-mediated gastritis.  Presentation not consistent with variceal bleeding. 2.  Alcohol mediated cirrhosis.  No overt decompensation.  Plan:  1.  Pantoprazole 40 mg po bid. 2.  Continue metoprolol. 3.  Nu-Iron 150 mg po qd. 4.  Discontinue  octreotide. 5.  Transfusion. 6.  No alcohol.  7.  Follow serial CBCs; if Hgb continues to drop, could continue repeat endoscopy, but it does not seem necessary at the present time. 8.  Soft diet, advance as tolerated. 9.  Patient should be able to be moved to regular floor bed. 10. Watch for signs of alcohol withdrawal. 11. [facility] will follow.   Jerome [name] 06/06/2014, 9:44 AM

## 2014-06-06 NOTE — Progress Notes (Signed)
CRITICAL VALUE ALERT  Critical value received:  Hgb 6.4   Date of notification:  06/06/14  Time of notification:  0630  Critical value read back:Yes.    Nurse who received alert:  C. Lavell LusterAyers  MD notified (1st page):  [facility] NP   Time of first page:  [phone]  MD notified (2nd page):   Time of second page:  Responding MD:  [facility] NP  Time MD responded:  [phone]

## 2014-06-06 NOTE — Progress Notes (Signed)
PROGRESS NOTE  Jerome BodyWilliam Emert ZOX:[id number] DOB: 08/07/1976 DOA: 06/05/2014 PCP: PROVIDER NOT IN SYSTEM  HPI: 38 yo M with ROH abuse, liver cirrhosis, CKD III admitted with anemia.   Subjective/ 24 H Interval events - feeling well this morning, asking to go home  Assessment/Plan: Anemia - multifactorial due to liver disease with ongoing ETOH use, CKD, known varices. - no active bleeding, stable overnight, GI evaluated patient, appreciate input, looks like no need for an EGD right now - continue to transfuse, he is at his 4th unit this morning - monitor CBCs, transfer to floor today, allow diet and consider home tomorrow of CBC stable. Alcohol abuse with mild withdrawal symptoms  - Patient had mild tremulousness on exam on admission, better this morning  - start CIWA Cirrhosis of the liver - Supported by transaminitis and mild coagulopathy. Hypertension - Resume home medications  Protein-calorie malnutrition, severe - Nutrition consult  Fatty liver with possible EtOH cirrhosis - Patient jaundiced on exam with mild transaminitis.   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 Renal function is normal. Sees outpatient nephrologist   Diet: soft Fluids: none DVT Prophylaxis: SCDs  Code Status: Full Family Communication: d/w patient  Disposition Plan: home when ready  Consultants:  GI  Procedures:  None    Antibiotics None   Studies  Filed Vitals:   06/06/14 0000 06/06/14 0200 06/06/14 0630 06/06/14 0700  BP:  143/88 143/79 143/79  Pulse: 88 91 101   Temp:  99 F (37.2 C) 98.8 F (37.1 C) 98.6 F (37 C)  TempSrc:  Oral Oral Oral  Resp: 26 19 20    Height:      Weight:      SpO2: 92% 92% 90%     Intake/Output Summary (Last 24 hours) at 06/06/14 0719 Last data filed at 06/06/14  0658  Gross per 24 hour  Intake 2905.42 ml  Output      0 ml  Net 2905.42 ml   Filed Weights   06/05/14 1810  Weight: 94.4 kg (208 lb 1.8 oz)    Exam:  General:  NAD  Cardiovascular: RRR  Respiratory: CTA biL  Abdomen: soft, non tender  MSK: no edema  Data Reviewed: Basic Metabolic Panel:  Recent Labs Lab 06/05/14 1319 06/06/14 0555  NA 142 143  K 3.5* 3.8  CL 106 109  CO2 21 20  GLUCOSE 111* 129*  BUN 6 6  CREATININE 0.62 0.78  CALCIUM 7.9* 7.3*   Liver Function Tests:  Recent Labs Lab 06/05/14 1319 06/06/14 0555  AST 90* 96*  ALT 17 16  ALKPHOS 188* 174*  BILITOT 2.0* 3.1*  PROT 7.1 6.9  ALBUMIN 2.7* 2.7*    Recent Labs Lab 06/05/14 1322  AMMONIA 76*   CBC:  Recent Labs Lab 06/05/14 1319 06/06/14 0555  WBC 8.4 8.8  NEUTROABS 6.0  --   HGB 4.5* 6.4*  HCT 15.5* 21.1*  MCV 74.9* 78.7  PLT 157 155   CBG:  Recent Labs Lab 06/05/14 2028  GLUCAP 119*    Recent Results (from the past 240 hour(s))  MRSA PCR SCREENING     Status: Abnormal   Collection Time    06/05/14  6:45 PM      Result Value Ref Range Status   MRSA by PCR POSITIVE (*) NEGATIVE Final   Comment:  The GeneXpert MRSA Assay (FDA     approved for NASAL specimens     only), is one component of a     comprehensive MRSA colonization     surveillance program. It is not     intended to diagnose MRSA     infection nor to guide or     monitor treatment for     MRSA infections.     RESULT CALLED TO, READ BACK BY AND VERIFIED WITH:     C.AYERS,RN AT 2213 ON 06/05/14 BY SHEAW     Studies: [place] Abdomen Complete 06/05/2014 There is hepatomegaly and splenomegaly consistent with parenchymal liver disease. The echotexture of the liver is inhomogeneous consistent with the underlying parenchymal disease. While no focal liver lesions are identified, it must be cautioned that the sensitivity of ultrasound for focal liver lesions is diminished significantly in this  circumstance. Note that there is recanalization of the umbilical vein, a finding consistent with cirrhosis.  Sludge in the gallbladder with borderline gallbladder wall thickening. No gallstones seen. If there is concern for acalculous cholecystitis, correlation with nuclear medicine hepatobiliary imaging study to assess for cystic duct patency would be reasonable.  Portions of the pancreas are obscured by gas. Visualized portions of pancreas appear normal.  No demonstrable ascites.  Scheduled Meds: . sodium chloride   Intravenous Once  . sodium chloride   Intravenous Once  . Chlorhexidine Gluconate Cloth  6 each Topical Q0600  . Influenza vac split quadrivalent PF  0.5 mL Intramuscular Tomorrow-1000  . insulin aspart  0-9 Units Subcutaneous 6 times per day  . insulin glargine  15 Units Subcutaneous Q breakfast  . iron polysaccharides  150 mg Oral Daily  . losartan  50 mg Oral Daily  . magnesium oxide  400 mg Oral BID  . metoprolol tartrate  25 mg Oral BID  . mupirocin ointment  1 application Nasal BID  . pantoprazole (PROTONIX) IV  40 mg Intravenous Q12H  . pneumococcal 23 valent vaccine  0.5 mL Intramuscular Tomorrow-1000  . potassium chloride  40 mEq Oral Once  . sodium chloride  3 mL Intravenous Q12H   Continuous Infusions: . sodium chloride 100 mL/hr at 06/05/14 2154  . 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 If 7 PM - 7 AM, please contact night-coverage at [email or web address], password [email or web address] 06/06/2014, 7:19 AM  LOS: 1 day

## 2014-06-07 ENCOUNTER — Inpatient Hospital Stay (HOSPITAL_COMMUNITY): Payer: BC Managed Care – PPO

## 2014-06-07 DIAGNOSIS — J189 Pneumonia, unspecified organism: Secondary | ICD-10-CM | POA: Diagnosis present

## 2014-06-07 LAB — GLUCOSE, CAPILLARY
GLUCOSE-CAPILLARY: 109 mg/dL — AB (ref 70–99)
GLUCOSE-CAPILLARY: 86 mg/dL (ref 70–99)
Glucose-Capillary: 105 mg/dL — ABNORMAL HIGH (ref 70–99)
Glucose-Capillary: 107 mg/dL — ABNORMAL HIGH (ref 70–99)
Glucose-Capillary: 109 mg/dL — ABNORMAL HIGH (ref 70–99)
Glucose-Capillary: 85 mg/dL (ref 70–99)
Glucose-Capillary: 97 mg/dL (ref 70–99)

## 2014-06-07 LAB — CBC
HCT: 24.9 % — ABNORMAL LOW (ref 39.0–52.0)
Hemoglobin: 7.6 g/dL — ABNORMAL LOW (ref 13.0–17.0)
MCH: 24.1 pg — ABNORMAL LOW (ref 26.0–34.0)
MCHC: 30.5 g/dL (ref 30.0–36.0)
MCV: 79 fL (ref 78.0–100.0)
PLATELETS: 183 10*3/uL (ref 150–400)
RBC: 3.15 MIL/uL — ABNORMAL LOW (ref 4.22–5.81)
RDW: 18.2 % — AB (ref 11.5–15.5)
WBC: 12.3 10*3/uL — ABNORMAL HIGH (ref 4.0–10.5)

## 2014-06-07 LAB — URINALYSIS, ROUTINE W REFLEX MICROSCOPIC
GLUCOSE, UA: NEGATIVE mg/dL
Hgb urine dipstick: NEGATIVE
Ketones, ur: 40 mg/dL — AB
Leukocytes, UA: NEGATIVE
NITRITE: NEGATIVE
PROTEIN: NEGATIVE mg/dL
Specific Gravity, Urine: 1.018 (ref 1.005–1.030)
Urobilinogen, UA: 1 mg/dL (ref 0.0–1.0)
pH: 6 (ref 5.0–8.0)

## 2014-06-07 LAB — EXPECTORATED SPUTUM ASSESSMENT W REFEX TO RESP CULTURE

## 2014-06-07 LAB — EXPECTORATED SPUTUM ASSESSMENT W GRAM STAIN, RFLX TO RESP C

## 2014-06-07 MED ORDER — SODIUM CHLORIDE 0.9 % IV SOLN
Freq: Once | INTRAVENOUS | Status: AC
  Administered 2014-06-07: 15:00:00 via INTRAVENOUS

## 2014-06-07 MED ORDER — HYDROCOD POLST-CHLORPHEN POLST 10-8 MG/5ML PO LQCR
5.0000 mL | Freq: Two times a day (BID) | ORAL | Status: DC | PRN
  Administered 2014-06-07: 5 mL via ORAL
  Filled 2014-06-07: qty 5

## 2014-06-07 MED ORDER — LORAZEPAM 2 MG/ML IJ SOLN
1.0000 mg | INTRAMUSCULAR | Status: DC | PRN
  Administered 2014-06-08: 2 mg via INTRAVENOUS
  Filled 2014-06-07 (×2): qty 1

## 2014-06-07 MED ORDER — LEVOFLOXACIN IN D5W 750 MG/150ML IV SOLN
750.0000 mg | Freq: Every day | INTRAVENOUS | Status: DC
  Administered 2014-06-07: 750 mg via INTRAVENOUS
  Filled 2014-06-07 (×2): qty 150

## 2014-06-07 NOTE — Progress Notes (Addendum)
06/07/14 2315 Patient non-compliant with wearing nasal cannula. Patient desats into mid 80's on RA. Respiratory therapy to see patient. Patient O2 increased to 6 L  [place] by respiratory therapy with Oxygen sats in mid 90's. Patient with increased work of breathing and strong productive cough. On assessment lips noted to have purple color.  Alvester MorinNewton, MD called to the floor to assess patient. MD at bedside. Will continue to monitor patient closely.     06/07/14 2333 New orders placed. Patient keeping nasal cannula on. Ativan 2 mg given. Will continue to monitor closely.

## 2014-06-07 NOTE — Progress Notes (Signed)
PROGRESS NOTE  Jerome BodyWilliam [name] [id number] DOB: 06/27/1976 DOA: 06/05/2014 PCP: PROVIDER NOT IN SYSTEM  HPI: 38 yo M with ROH abuse, liver cirrhosis, CKD III admitted with anemia.   Subjective/ 24 H Interval events - increasing cough last night, mild leukocytosis this morning, low grade temp  Assessment/Plan: Anemia - multifactorial due to liver disease with ongoing ETOH use, CKD, known varices. - no active bleeding, stable overnight, GI evaluated patient, appreciate input, looks like no need for an EGD right now - continue to transfuse, he is at his 4th unit this morning - monitor CBCs, transfer to floor today, allow diet and consider home tomorrow of CBC stable. Alcohol abuse with mild withdrawal symptoms  - Patient had mild tremulousness on exam on admission, better this morning  CAP  - patient without recent hospitalizations, CXR with evidence of pneumonia, has leukocytosis and low grade temp. - start Levaquin IV, transition to oral tomorrow if stable Cirrhosis of the liver - Supported by transaminitis and mild coagulopathy. Hypertension - Resume home medications  Protein-calorie malnutrition, severe - Nutrition consult  Fatty liver with possible EtOH cirrhosis - Patient jaundiced on exam with mild transaminitis.   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 Renal function is normal. Sees outpatient nephrologist   Diet: soft Fluids: none DVT Prophylaxis: SCDs  Code Status: Full Family Communication: d/w parents bedside Disposition Plan: home when ready  Consultants:  GI  Procedures:  None    Antibiotics Levofloxacin 10/11 >>   Studies  Filed Vitals:   06/06/14 2101 06/07/14 0020 06/07/14 0141 06/07/14 0420  BP: 139/82 130/70  127/67  Pulse: 110 91  105  Temp: 99.1 F (37.3  C) 100 F (37.8 C) 98.3 F (36.8 C) 99.3 F (37.4 C)  TempSrc: Oral Oral Oral Oral  Resp: 22 22  22   Height:      Weight:      SpO2: 92% 90%  91%    Intake/Output Summary (Last 24 hours) at 06/07/14 1129 Last data filed at 06/06/14 1905  Gross per 24 hour  Intake    652 ml  Output      0 ml  Net    652 ml   Filed Weights   06/05/14 1810  Weight: 94.4 kg (208 lb 1.8 oz)    Exam:  General:  NAD  Cardiovascular: RRR  Respiratory: CTA biL  Abdomen: soft, non tender  MSK: no edema  Data Reviewed: Basic Metabolic Panel:  Recent Labs Lab 06/05/14 1319 06/06/14 0555  NA 142 143  K 3.5* 3.8  CL 106 109  CO2 21 20  GLUCOSE 111* 129*  BUN 6 6  CREATININE 0.62 0.78  CALCIUM 7.9* 7.3*   Liver Function Tests:  Recent Labs Lab 06/05/14 1319 06/06/14 0555  AST 90* 96*  ALT 17 16  ALKPHOS 188* 174*  BILITOT 2.0* 3.1*  PROT 7.1 6.9  ALBUMIN 2.7* 2.7*    Recent Labs Lab 06/05/14 1322  AMMONIA 76*   CBC:  Recent Labs Lab 06/05/14 1319 06/06/14 0555 06/06/14 1255 06/07/14 0500  WBC 8.4 8.8 10.5 12.3*  NEUTROABS 6.0  --   --   --   HGB 4.5* 6.4* 7.1* 7.6*  HCT 15.5* 21.1* 23.3* 24.9*  MCV 74.9* 78.7 79.5 79.0  PLT 157 155 167 183   CBG:  Recent Labs Lab  06/06/14 1737 06/06/14 2056 06/07/14 0014 06/07/14 0417 06/07/14 0735  GLUCAP 105* 109* 109* 105* 85    Recent Results (from the past 240 hour(s))  MRSA PCR SCREENING     Status: Abnormal   Collection Time    06/05/14  6:45 PM      Result Value Ref Range Status   MRSA by PCR POSITIVE (*) NEGATIVE Final   Comment:            The GeneXpert MRSA Assay (FDA     approved for NASAL specimens     only), is one component of a     comprehensive MRSA colonization     surveillance program. It is not     intended to diagnose MRSA     infection nor to guide or     monitor treatment for     MRSA infections.     RESULT CALLED TO, READ BACK BY AND VERIFIED WITH:     C.AYERS,RN AT 2213 ON  06/05/14 BY SHEAW     Studies: [place] Abdomen Complete 06/05/2014 There is hepatomegaly and splenomegaly consistent with parenchymal liver disease. The echotexture of the liver is inhomogeneous consistent with the underlying parenchymal disease. While no focal liver lesions are identified, it must be cautioned that the sensitivity of ultrasound for focal liver lesions is diminished significantly in this circumstance. Note that there is recanalization of the umbilical vein, a finding consistent with cirrhosis.  Sludge in the gallbladder with borderline gallbladder wall thickening. No gallstones seen. If there is concern for acalculous cholecystitis, correlation with nuclear medicine hepatobiliary imaging study to assess for cystic duct patency would be reasonable.  Portions of the pancreas are obscured by gas. Visualized portions of pancreas appear normal.  No demonstrable ascites.  Scheduled Meds: . sodium chloride   Intravenous Once  . sodium chloride   Intravenous Once  . Chlorhexidine Gluconate Cloth  6 each Topical Q0600  . insulin aspart  0-9 Units Subcutaneous TID WC & HS  . insulin glargine  15 Units Subcutaneous Q breakfast  . iron polysaccharides  150 mg Oral Daily  . levofloxacin (LEVAQUIN) IV  750 mg Intravenous Q1200  . losartan  50 mg Oral Daily  . magnesium oxide  400 mg Oral BID  . metoprolol tartrate  25 mg Oral BID  . mupirocin ointment  1 application Nasal BID  . pantoprazole  40 mg Oral BID AC  . potassium chloride  40 mEq Oral Once  . sodium chloride  3 mL Intravenous Q12H   Continuous Infusions:    Principal Problem:   Acute blood loss anemia Active Problems:   Diabetes mellitus   Hypertension   Alcohol abuse   Anemia   Protein-calorie malnutrition, severe   Chronic kidney disease (CKD), stage III (moderate)   Type 2 diabetes mellitus with stage 3 chronic kidney disease   Esophageal varices in alcoholic cirrhosis   Time spent: 25  Pamella Pertostin Gherghe, MD [facility] Pager [phone]. If 7 PM - 7 AM, please contact night-coverage at [email or web address], password [email or web address] 06/07/2014, 11:29 AM  LOS: 2 days

## 2014-06-07 NOTE — Progress Notes (Signed)
ANTIBIOTIC CONSULT NOTE - INITIAL  Pharmacy Consult for levofloxacin Indication: pneumonia  No Known Allergies  Patient Measurements: Height: 5\' 10"  (177.8 cm) Weight: 208 lb 1.8 oz (94.4 kg) IBW/kg (Calculated) : 73 Adjusted Body Weight: 81.5kg  Vital Signs: Temp: 99.3 F (37.4 C) (10/11 0420) Temp Source: Oral (10/11 0420) BP: 127/67 mmHg (10/11 0420) Pulse Rate: 105 (10/11 0420) Intake/Output from previous day: 10/10 0701 - 10/11 0700 In: 1197 [P.O.:240; I.V.:625; Blood:332] Out: -  Intake/Output from this shift:    Labs:  Recent Labs  06/05/14 1319 06/06/14 0555 06/06/14 1255 06/07/14 0500  WBC 8.4 8.8 10.5 12.3*  HGB 4.5* 6.4* 7.1* 7.6*  PLT 157 155 167 183  CREATININE 0.62 0.78  --   --    Estimated Creatinine Clearance: 147.3 ml/min (by C-G formula based on Cr of 0.78). No results found for this basename: VANCOTROUGH, Leodis BinetVANCOPEAK, VANCORANDOM, GENTTROUGH, GENTPEAK, GENTRANDOM, TOBRATROUGH, TOBRAPEAK, TOBRARND, AMIKACINPEAK, AMIKACINTROU, AMIKACIN,  in the last 72 hours   Microbiology: Recent Results (from the past 720 hour(s))  MRSA PCR SCREENING     Status: Abnormal   Collection Time    06/05/14  6:45 PM      Result Value Ref Range Status   MRSA by PCR POSITIVE (*) NEGATIVE Final   Comment:            The GeneXpert MRSA Assay (FDA     approved for NASAL specimens     only), is one component of a     comprehensive MRSA colonization     surveillance program. It is not     intended to diagnose MRSA     infection nor to guide or     monitor treatment for     MRSA infections.     RESULT CALLED TO, READ BACK BY AND VERIFIED WITH:     C.AYERS,RN AT 2213 ON 06/05/14 BY SHEAW    Medical History: Past Medical History  Diagnosis Date  . Diabetes mellitus without complication   . Hypertension     Medications:  Scheduled:  . sodium chloride   Intravenous Once  . sodium chloride   Intravenous Once  . Chlorhexidine Gluconate Cloth  6 each Topical Q0600   . insulin aspart  0-9 Units Subcutaneous TID WC & HS  . insulin glargine  15 Units Subcutaneous Q breakfast  . iron polysaccharides  150 mg Oral Daily  . levofloxacin (LEVAQUIN) IV  750 mg Intravenous Q1200  . losartan  50 mg Oral Daily  . magnesium oxide  400 mg Oral BID  . metoprolol tartrate  25 mg Oral BID  . mupirocin ointment  1 application Nasal BID  . pantoprazole  40 mg Oral BID AC  . potassium chloride  40 mEq Oral Once  . sodium chloride  3 mL Intravenous Q12H   Assessment: 38 yoM admitted 10/9 after being called by PCP that Hgb was 4. Pt admitted for GIB. Pt with Hx EtOH abuse, cirrhosis, bleeding esophageal varices, DM2, and CKD. CXray this AM shows R perihilar and lower lobe patchy airspace suspicious for pneumonia. Patient also wht new low-grade fever and leukocytosis. Pharmacy consulted to dose levofloxacin for CAP.  Antiinfectives  10/11 >> levofloxacin >>  Labs / vitals Tmax: 100 WBCs: 12.3 Renal: SCr 0.78, CrCl >100 ml/min CG  Microbiology 10/9 MRSA PCR: positive  Goal of Therapy:  levofloxacin per indication and renal function  Plan:  - levofloxacin 750mg  IV q24h - follow-up transition to PO formulation and length of  therapy - follow-up clinical course, renal function  Thank you for the consult.  Ross LudwigJesse Solei Wubben Akers, PharmD, BCPS Pager: [phone] Pharmacy: [phone] 06/07/2014 11:42 AM therapy

## 2014-06-07 NOTE — H&P (Signed)
Got called by nursing about pt with O2 sats at the mid 80s off of oxygen. Is on 6 L nasal cannula currently in setting of community-acquired pneumonia and obstructive sleep apnea. Recently admitted for acute blood loss anemia in setting of alcohol abuse as well as portal gastropathy and gastritis. Patient has been pulling nasal cannula off at times. Has been on CIWA protocol. Agitation improves with IV Ativan. O2 sats do seem to improve once nasal cannula is maintained. O2 sats stable when O2 is maintained. Discussed case with nursing as well as respiratory.Examined at bedside. Pt arousable.  Plan Continue with CIWA protocol Continue supplemental O2 Check ABG  Recheck ammonia  Titrate ativan as needed If pt continues to have persistent agitation-consider transfer to ICU for BiPAP with sedation May need PCCM to help in management of resp status.

## 2014-06-07 NOTE — Progress Notes (Signed)
[facility] Progress Note  Subjective: And some fever leukocytosis and cough, chest x-ray suggests possible pneumonia. No dark stools no abdominal complaints. Received another blood last night, current hemoglobin 7.6.  Objective: Vital signs in last 24 hours: Temp:  [97.8 F (36.6 C)-100 F (37.8 C)] 99.3 F (37.4 C) (10/11 0420) Pulse Rate:  [91-110] 105 (10/11 0420) Resp:  [22-24] 22 (10/11 0420) BP: (127-149)/(58-82) 127/67 mmHg (10/11 0420) SpO2:  [89 %-95 %] 91 % (10/11 0420) Weight change:    PE: Appears slightly dyspneic at rest abdomen soft mildly distended. No icterus  Lab Results: Results for orders placed during the hospital encounter of 06/05/14 (from the past 24 hour(s))  PREPARE RBC (CROSSMATCH)     Status: None   Collection Time    06/06/14  3:00 PM      Result Value Ref Range   Order Confirmation ORDER PROCESSED BY BLOOD BANK    GLUCOSE, CAPILLARY     Status: Abnormal   Collection Time    06/06/14  5:37 PM      Result Value Ref Range   Glucose-Capillary 105 (*) 70 - 99 mg/dL  GLUCOSE, CAPILLARY     Status: Abnormal   Collection Time    06/06/14  8:56 PM      Result Value Ref Range   Glucose-Capillary 109 (*) 70 - 99 mg/dL  GLUCOSE, CAPILLARY     Status: Abnormal   Collection Time    06/07/14 12:14 AM      Result Value Ref Range   Glucose-Capillary 109 (*) 70 - 99 mg/dL  GLUCOSE, CAPILLARY     Status: Abnormal   Collection Time    06/07/14  4:17 AM      Result Value Ref Range   Glucose-Capillary 105 (*) 70 - 99 mg/dL  CBC     Status: Abnormal   Collection Time    06/07/14  5:00 AM      Result Value Ref Range   WBC 12.3 (*) 4.0 - 10.5 K/uL   RBC 3.15 (*) 4.22 - 5.81 MIL/uL   Hemoglobin 7.6 (*) 13.0 - 17.0 g/dL   HCT 40.924.9 (*) 81.139.0 - 91.452.0 %   MCV 79.0  78.0 - 100.0 fL   MCH 24.1 (*) 26.0 - 34.0 pg   MCHC 30.5  30.0 - 36.0 g/dL   RDW 78.218.2 (*) 95.611.5 - 21.315.5 %   Platelets 183  150 - 400 K/uL  GLUCOSE, CAPILLARY     Status: None    Collection Time    06/07/14  7:35 AM      Result Value Ref Range   Glucose-Capillary 85  70 - 99 mg/dL  URINALYSIS, ROUTINE W REFLEX MICROSCOPIC     Status: Abnormal   Collection Time    06/07/14 10:07 AM      Result Value Ref Range   Color, Urine AMBER (*) YELLOW   APPearance CLEAR  CLEAR   Specific Gravity, Urine 1.018  1.005 - 1.030   pH 6.0  5.0 - 8.0   Glucose, UA NEGATIVE  NEGATIVE mg/dL   Hgb urine dipstick NEGATIVE  NEGATIVE   Bilirubin Urine SMALL (*) NEGATIVE   Ketones, ur 40 (*) NEGATIVE mg/dL   Protein, ur NEGATIVE  NEGATIVE mg/dL   Urobilinogen, UA 1.0  0.0 - 1.0 mg/dL   Nitrite NEGATIVE  NEGATIVE   Leukocytes, UA NEGATIVE  NEGATIVE  GLUCOSE, CAPILLARY     Status: Abnormal   Collection Time    06/07/14 11:46 AM  Result Value Ref Range   Glucose-Capillary 109 (*) 70 - 99 mg/dL    Studies/Results: Dg Chest 2 View  06/07/2014   CLINICAL DATA:  Shortness of breath, cough, congestion, fever  EXAM: CHEST  2 VIEW  COMPARISON:  12/05/2011  FINDINGS: Borderline cardiomegaly. There is patchy airspace disease right perihilar and right right lower lobe highly suspicious for infiltrate/pneumonia. Streaky left lower lobe atelectasis or infiltrate. Old left rib fractures. No convincing pulmonary edema.  IMPRESSION: Patchy airspace disease right perihilar and right lower lobe highly suspicious for pneumonia. Streaky left lower lobe atelectasis or infiltrate. No convincing pulmonary edema. Old left rib fractures.   Electronically Signed   By: Natasha MeadLiviu  Pop M.D.   On: 06/07/2014 10:59   [place] Abdomen Complete  06/05/2014   CLINICAL DATA:  Hepatic cirrhosis  EXAM: ULTRASOUND ABDOMEN COMPLETE  COMPARISON:  March 19, 2014  FINDINGS: Gallbladder: There is sludge within the gallbladder. No gallstones are appreciated. Gallbladder wall appears borderline thickened. There is no pericholecystic fluid or gallbladder wall edema. No sonographic Murphy sign noted.  Common bile duct: Diameter: 5 mm. There  is no intrahepatic, common hepatic, or common bile duct dilatation.  Liver: No focal lesion identified. Liver measures approximately 20 cm in length, enlarged. The liver echogenicity is inhomogeneous consistent with underlying parenchymal disease. There is recanalization of the umbilical vein.  IVC: No abnormality visualized.  Pancreas: Visualized portion unremarkable. Portions of pancreas obscured by gas.  Spleen: Spleen is enlarged measuring 15.4 x 17.4 x 9.5 cm with a measured splenic volume of 1,337 cubic cm  Right Kidney: Length: 13.5 cm. Echogenicity within normal limits. No mass or hydronephrosis visualized.  Left Kidney: Length: 14.4 cm. Echogenicity within normal limits. No mass or hydronephrosis visualized.  Abdominal aorta: No aneurysm visualized.  Other findings: No appreciable ascites.  IMPRESSION: There is hepatomegaly and splenomegaly consistent with parenchymal liver disease. The echotexture of the liver is inhomogeneous consistent with the underlying parenchymal disease. While no focal liver lesions are identified, it must be cautioned that the sensitivity of ultrasound for focal liver lesions is diminished significantly in this circumstance. Note that there is recanalization of the umbilical vein, a finding consistent with cirrhosis.  Sludge in the gallbladder with borderline gallbladder wall thickening. No gallstones seen. If there is concern for acalculous cholecystitis, correlation with nuclear medicine hepatobiliary imaging study to assess for cystic duct patency would be reasonable.  Portions of the pancreas are obscured by gas. Visualized portions of pancreas appear normal.  No demonstrable ascites.   Electronically Signed   By: Bretta BangWilliam  Woodruff M.D.   On: 06/05/2014 17:55      Assessment: 1. Anemia, associated with heme positive stools with EGD showing low-grade varices and portal gastropathy in July, no frank melena or hematochezia. Spot appropriately to yesterday's  transfusion 2. New onset pneumonia 3. Alcoholic cirrhosis  Plan: 1. Transfuse 1 more unit of PRBCs 2. Due to monitor stools and hemoglobin. 3. Continue soft diet for now. 4. Continue to defer EGD unless active signs of bleeding or persistently falling hemoglobin 5. Antibiotics started for pneumonia    Riana Tessmer C 06/07/2014, 1:27 PM

## 2014-06-08 ENCOUNTER — Inpatient Hospital Stay (HOSPITAL_COMMUNITY): Payer: BC Managed Care – PPO

## 2014-06-08 LAB — CBC
HCT: 26.7 % — ABNORMAL LOW (ref 39.0–52.0)
Hemoglobin: 8.2 g/dL — ABNORMAL LOW (ref 13.0–17.0)
MCH: 24.9 pg — ABNORMAL LOW (ref 26.0–34.0)
MCHC: 30.7 g/dL (ref 30.0–36.0)
MCV: 81.2 fL (ref 78.0–100.0)
PLATELETS: 174 10*3/uL (ref 150–400)
RBC: 3.29 MIL/uL — ABNORMAL LOW (ref 4.22–5.81)
RDW: 18.6 % — ABNORMAL HIGH (ref 11.5–15.5)
WBC: 11.4 10*3/uL — ABNORMAL HIGH (ref 4.0–10.5)

## 2014-06-08 LAB — BLOOD GAS, ARTERIAL
ACID-BASE DEFICIT: 3.6 mmol/L — AB (ref 0.0–2.0)
ALLENS TEST (PASS/FAIL): POSITIVE — AB
Acid-base deficit: 4.6 mmol/L — ABNORMAL HIGH (ref 0.0–2.0)
Acid-base deficit: 4.3 mmol/L — ABNORMAL HIGH (ref 0.0–2.0)
BICARBONATE: 20.8 meq/L (ref 20.0–24.0)
BICARBONATE: 19.6 meq/L — AB (ref 20.0–24.0)
Bicarbonate: 18.9 mEq/L — ABNORMAL LOW (ref 20.0–24.0)
DRAWN BY: 31297
Drawn by: 235321
Drawn by: 422461
FIO2: 0.6 %
FIO2: 1 %
LHR: 18 {breaths}/min
LHR: 20 {breaths}/min
MECHVT: 580 mL
MODE: POSITIVE
O2 Content: 6 L/min
O2 SAT: 95.2 %
O2 SAT: 72.2 %
O2 Saturation: 98.7 %
PATIENT TEMPERATURE: 98.3
PATIENT TEMPERATURE: 99
PCO2 ART: 30.9 mmHg — AB (ref 35.0–45.0)
PEEP/CPAP: 10 cmH2O
PEEP: 5 cmH2O
PH ART: 7.412 (ref 7.350–7.450)
PH ART: 7.323 — AB (ref 7.350–7.450)
PRESSURE CONTROL: 8 cmH2O
Patient temperature: 99.4
TCO2: 18 mmol/L (ref 0–100)
TCO2: 18.7 mmol/L (ref 0–100)
TCO2: 19.8 mmol/L (ref 0–100)
pCO2 arterial: 30.3 mmHg — ABNORMAL LOW (ref 35.0–45.0)
pCO2 arterial: 41.3 mmHg (ref 35.0–45.0)
pH, Arterial: 7.422 (ref 7.350–7.450)
pO2, Arterial: 120 mmHg — ABNORMAL HIGH (ref 80.0–100.0)
pO2, Arterial: 85.2 mmHg (ref 80.0–100.0)

## 2014-06-08 LAB — TYPE AND SCREEN
ABO/RH(D): O POS
Antibody Screen: NEGATIVE
UNIT DIVISION: 0
UNIT DIVISION: 0
Unit division: 0
Unit division: 0
Unit division: 0
Unit division: 0

## 2014-06-08 LAB — COMPREHENSIVE METABOLIC PANEL
ALT: 14 U/L (ref 0–53)
AST: 74 U/L — ABNORMAL HIGH (ref 0–37)
Albumin: 2.6 g/dL — ABNORMAL LOW (ref 3.5–5.2)
Alkaline Phosphatase: 147 U/L — ABNORMAL HIGH (ref 39–117)
Anion gap: 13 (ref 5–15)
BUN: 8 mg/dL (ref 6–23)
CALCIUM: 7.7 mg/dL — AB (ref 8.4–10.5)
CO2: 20 mEq/L (ref 19–32)
Chloride: 108 mEq/L (ref 96–112)
Creatinine, Ser: 0.71 mg/dL (ref 0.50–1.35)
GFR calc Af Amer: >90 mL/min (ref >90)
GFR calc non Af Amer: >90 mL/min (ref >90)
GLUCOSE: 108 mg/dL — AB (ref 70–99)
Potassium: 3.3 mEq/L — ABNORMAL LOW (ref 3.7–5.3)
SODIUM: 141 meq/L (ref 137–147)
Total Bilirubin: 3.4 mg/dL — ABNORMAL HIGH (ref 0.3–1.2)
Total Protein: 6.6 g/dL (ref 6.0–8.3)

## 2014-06-08 LAB — INFLUENZA PANEL BY PCR (TYPE A & B)
H1N1 flu by pcr: NOT DETECTED
Influenza A By PCR: NEGATIVE
Influenza B By PCR: NEGATIVE

## 2014-06-08 LAB — GLUCOSE, CAPILLARY
GLUCOSE-CAPILLARY: 83 mg/dL (ref 70–99)
Glucose-Capillary: 88 mg/dL (ref 70–99)
Glucose-Capillary: 119 mg/dL — ABNORMAL HIGH (ref 70–99)
Glucose-Capillary: 129 mg/dL — ABNORMAL HIGH (ref 70–99)
Glucose-Capillary: 92 mg/dL (ref 70–99)

## 2014-06-08 LAB — LEGIONELLA ANTIGEN, URINE

## 2014-06-08 LAB — MAGNESIUM: MAGNESIUM: 1.5 mg/dL (ref 1.5–2.5)

## 2014-06-08 LAB — STREP PNEUMONIAE URINARY ANTIGEN: Strep Pneumo Urinary Antigen: NEGATIVE

## 2014-06-08 LAB — AMMONIA: Ammonia: 74 umol/L — ABNORMAL HIGH (ref 11–60)

## 2014-06-08 LAB — TRIGLYCERIDES: TRIGLYCERIDES: 239 mg/dL — AB (ref <150)

## 2014-06-08 MED ORDER — LIDOCAINE HCL (CARDIAC) 20 MG/ML IV SOLN
INTRAVENOUS | Status: AC
  Filled 2014-06-08: qty 5

## 2014-06-08 MED ORDER — THIAMINE HCL 100 MG/ML IJ SOLN
100.0000 mg | Freq: Every day | INTRAMUSCULAR | Status: DC
  Administered 2014-06-08 – 2014-06-17 (×10): 100 mg via INTRAVENOUS
  Filled 2014-06-08 (×10): qty 2

## 2014-06-08 MED ORDER — FENTANYL CITRATE 0.05 MG/ML IJ SOLN
INTRAMUSCULAR | Status: AC
  Administered 2014-06-08: 200 ug
  Filled 2014-06-08: qty 2

## 2014-06-08 MED ORDER — DEXMEDETOMIDINE HCL IN NACL 200 MCG/50ML IV SOLN
0.4000 ug/kg/h | INTRAVENOUS | Status: DC
  Administered 2014-06-08: 0.9 ug/kg/h via INTRAVENOUS
  Administered 2014-06-08: 0.4 ug/kg/h via INTRAVENOUS
  Filled 2014-06-08 (×2): qty 50

## 2014-06-08 MED ORDER — VANCOMYCIN HCL IN DEXTROSE 1-5 GM/200ML-% IV SOLN
1000.0000 mg | Freq: Three times a day (TID) | INTRAVENOUS | Status: DC
  Administered 2014-06-08 – 2014-06-09 (×5): 1000 mg via INTRAVENOUS
  Filled 2014-06-08 (×5): qty 200

## 2014-06-08 MED ORDER — BENZONATATE 100 MG PO CAPS: 100.0000 mg | ORAL_CAPSULE | Freq: Three times a day (TID) | ORAL | Status: DC | PRN

## 2014-06-08 MED ORDER — FENTANYL CITRATE 0.05 MG/ML IJ SOLN
25.0000 ug | INTRAMUSCULAR | Status: DC | PRN
  Administered 2014-06-08 (×2): 100 ug via INTRAVENOUS
  Filled 2014-06-08: qty 2

## 2014-06-08 MED ORDER — CETYLPYRIDINIUM CHLORIDE 0.05 % MT LIQD
7.0000 mL | Freq: Four times a day (QID) | OROMUCOSAL | Status: DC
  Administered 2014-06-08 – 2014-07-07 (×113): 7 mL via OROMUCOSAL

## 2014-06-08 MED ORDER — PROPOFOL 10 MG/ML IV EMUL
0.0000 ug/kg/min | INTRAVENOUS | Status: DC
  Administered 2014-06-08 (×2): 50 ug/kg/min via INTRAVENOUS
  Administered 2014-06-08: 30 ug/kg/min via INTRAVENOUS
  Administered 2014-06-08: 50 ug/kg/min via INTRAVENOUS
  Administered 2014-06-09: 55 ug/kg/min via INTRAVENOUS
  Administered 2014-06-09 (×2): 65 ug/kg/min via INTRAVENOUS
  Administered 2014-06-09: 55 ug/kg/min via INTRAVENOUS
  Administered 2014-06-09 (×2): 50 ug/kg/min via INTRAVENOUS
  Administered 2014-06-10 (×3): 65 ug/kg/min via INTRAVENOUS
  Administered 2014-06-10: 70 ug/kg/min via INTRAVENOUS
  Administered 2014-06-10 (×2): 65 ug/kg/min via INTRAVENOUS
  Administered 2014-06-10: 70 ug/kg/min via INTRAVENOUS
  Administered 2014-06-10: 60 ug/kg/min via INTRAVENOUS
  Administered 2014-06-10 – 2014-06-11 (×4): 65 ug/kg/min via INTRAVENOUS
  Administered 2014-06-11: 60 ug/kg/min via INTRAVENOUS
  Administered 2014-06-11 – 2014-06-12 (×6): 65 ug/kg/min via INTRAVENOUS
  Administered 2014-06-12: 30 ug/kg/min via INTRAVENOUS
  Filled 2014-06-08 (×18): qty 100
  Filled 2014-06-08: qty 200
  Filled 2014-06-08 (×13): qty 100

## 2014-06-08 MED ORDER — SODIUM CHLORIDE 0.9 % IV SOLN
0.0000 ug/h | INTRAVENOUS | Status: DC
  Administered 2014-06-08: 50 ug/h via INTRAVENOUS
  Administered 2014-06-09: 250 ug/h via INTRAVENOUS
  Administered 2014-06-09: 150 ug/h via INTRAVENOUS
  Administered 2014-06-09: 300 ug/h via INTRAVENOUS
  Administered 2014-06-10: 400 ug/h via INTRAVENOUS
  Administered 2014-06-10: 250 ug/h via INTRAVENOUS
  Administered 2014-06-11 (×2): 125 ug/h via INTRAVENOUS
  Filled 2014-06-08 (×7): qty 50

## 2014-06-08 MED ORDER — PANTOPRAZOLE SODIUM 40 MG IV SOLR
40.0000 mg | Freq: Two times a day (BID) | INTRAVENOUS | Status: DC
  Administered 2014-06-08 – 2014-06-14 (×13): 40 mg via INTRAVENOUS
  Filled 2014-06-08 (×13): qty 40

## 2014-06-08 MED ORDER — FENTANYL CITRATE 0.05 MG/ML IJ SOLN
50.0000 ug | Freq: Once | INTRAMUSCULAR | Status: AC
  Administered 2014-06-08: 50 ug via INTRAVENOUS

## 2014-06-08 MED ORDER — DEXMEDETOMIDINE HCL IN NACL 200 MCG/50ML IV SOLN: 0.0000 ug/kg/h | INTRAVENOUS | Status: DC

## 2014-06-08 MED ORDER — MIDAZOLAM HCL 2 MG/2ML IJ SOLN: 2.0000 mg | INTRAMUSCULAR | Status: DC | PRN

## 2014-06-08 MED ORDER — ETOMIDATE 2 MG/ML IV SOLN
INTRAVENOUS | Status: AC
Start: 2014-06-08 — End: 2014-06-08
  Administered 2014-06-08: 20 mg
  Filled 2014-06-08: qty 20

## 2014-06-08 MED ORDER — MAGNESIUM OXIDE 400 (241.3 MG) MG PO TABS
400.0000 mg | ORAL_TABLET | Freq: Two times a day (BID) | ORAL | Status: DC
  Administered 2014-06-09 – 2014-06-10 (×4): 400 mg
  Filled 2014-06-08 (×4): qty 1

## 2014-06-08 MED ORDER — METOPROLOL TARTRATE 1 MG/ML IV SOLN
5.0000 mg | Freq: Three times a day (TID) | INTRAVENOUS | Status: DC
  Administered 2014-06-08: 5 mg via INTRAVENOUS
  Filled 2014-06-08: qty 5

## 2014-06-08 MED ORDER — MIDAZOLAM HCL 2 MG/2ML IJ SOLN
INTRAMUSCULAR | Status: AC
  Administered 2014-06-08: 2 mg
  Filled 2014-06-08: qty 2

## 2014-06-08 MED ORDER — SODIUM CHLORIDE 0.9 % IV SOLN
INTRAVENOUS | Status: DC
  Administered 2014-06-08 – 2014-06-10 (×4): via INTRAVENOUS
  Administered 2014-06-10: 1000 mL via INTRAVENOUS
  Administered 2014-06-12 – 2014-06-15 (×3): via INTRAVENOUS

## 2014-06-08 MED ORDER — PROPOFOL 10 MG/ML IV EMUL
INTRAVENOUS | Status: AC
  Filled 2014-06-08: qty 100

## 2014-06-08 MED ORDER — CETYLPYRIDINIUM CHLORIDE 0.05 % MT LIQD: 7.0000 mL | Freq: Two times a day (BID) | OROMUCOSAL | Status: DC

## 2014-06-08 MED ORDER — INSULIN ASPART 100 UNIT/ML ~~LOC~~ SOLN
0.0000 [IU] | SUBCUTANEOUS | Status: DC
  Administered 2014-06-08 – 2014-06-17 (×22): 1 [IU] via SUBCUTANEOUS
  Administered 2014-06-17: 2 [IU] via SUBCUTANEOUS
  Administered 2014-06-17 – 2014-06-19 (×7): 1 [IU] via SUBCUTANEOUS
  Administered 2014-06-19 (×2): 2 [IU] via SUBCUTANEOUS
  Administered 2014-06-19: 1 [IU] via SUBCUTANEOUS
  Administered 2014-06-19 (×2): 2 [IU] via SUBCUTANEOUS
  Administered 2014-06-20: 1 [IU] via SUBCUTANEOUS
  Administered 2014-06-20: 3 [IU] via SUBCUTANEOUS
  Administered 2014-06-20 (×2): 2 [IU] via SUBCUTANEOUS
  Administered 2014-06-20: 3 [IU] via SUBCUTANEOUS
  Administered 2014-06-20: 1 [IU] via SUBCUTANEOUS
  Administered 2014-06-21: 2 [IU] via SUBCUTANEOUS
  Administered 2014-06-21: 3 [IU] via SUBCUTANEOUS
  Administered 2014-06-21 – 2014-06-22 (×4): 2 [IU] via SUBCUTANEOUS
  Administered 2014-06-22: 1 [IU] via SUBCUTANEOUS
  Administered 2014-06-22 – 2014-06-23 (×10): 2 [IU] via SUBCUTANEOUS
  Administered 2014-06-23 – 2014-06-24 (×2): 3 [IU] via SUBCUTANEOUS
  Administered 2014-06-24: 2 [IU] via SUBCUTANEOUS
  Administered 2014-06-24: 3 [IU] via SUBCUTANEOUS
  Administered 2014-06-24: 2 [IU] via SUBCUTANEOUS
  Administered 2014-06-24: 3 [IU] via SUBCUTANEOUS
  Administered 2014-06-25: 2 [IU] via SUBCUTANEOUS
  Administered 2014-06-25: 3 [IU] via SUBCUTANEOUS
  Administered 2014-06-25 (×3): 2 [IU] via SUBCUTANEOUS
  Administered 2014-06-25: 3 [IU] via SUBCUTANEOUS
  Administered 2014-06-25 – 2014-06-26 (×3): 2 [IU] via SUBCUTANEOUS
  Administered 2014-06-26: 1 [IU] via SUBCUTANEOUS
  Administered 2014-06-26 – 2014-06-27 (×4): 2 [IU] via SUBCUTANEOUS
  Administered 2014-06-27 (×2): 1 [IU] via SUBCUTANEOUS
  Administered 2014-06-27: 2 [IU] via SUBCUTANEOUS
  Administered 2014-06-27: 1 [IU] via SUBCUTANEOUS
  Administered 2014-06-28 (×2): 2 [IU] via SUBCUTANEOUS
  Administered 2014-06-28: 1 [IU] via SUBCUTANEOUS
  Administered 2014-06-28 (×2): 2 [IU] via SUBCUTANEOUS
  Administered 2014-06-28: 1 [IU] via SUBCUTANEOUS
  Administered 2014-06-29 (×2): 2 [IU] via SUBCUTANEOUS
  Administered 2014-06-29: 1 [IU] via SUBCUTANEOUS
  Administered 2014-06-29: 2 [IU] via SUBCUTANEOUS
  Administered 2014-06-29: 1 [IU] via SUBCUTANEOUS
  Administered 2014-06-30: 2 [IU] via SUBCUTANEOUS
  Administered 2014-06-30 (×2): 1 [IU] via SUBCUTANEOUS
  Administered 2014-06-30 (×3): 2 [IU] via SUBCUTANEOUS
  Administered 2014-07-01 (×5): 1 [IU] via SUBCUTANEOUS
  Administered 2014-07-01: 2 [IU] via SUBCUTANEOUS
  Administered 2014-07-02 – 2014-07-03 (×5): 1 [IU] via SUBCUTANEOUS

## 2014-06-08 MED ORDER — FENTANYL CITRATE 0.05 MG/ML IJ SOLN
INTRAMUSCULAR | Status: AC
  Filled 2014-06-08: qty 2

## 2014-06-08 MED ORDER — HYDROCOD POLST-CHLORPHEN POLST 10-8 MG/5ML PO LQCR: 5.0000 mL | Freq: Two times a day (BID) | ORAL | Status: DC | PRN

## 2014-06-08 MED ORDER — SUCCINYLCHOLINE CHLORIDE 20 MG/ML IJ SOLN
INTRAMUSCULAR | Status: AC
  Filled 2014-06-08: qty 1

## 2014-06-08 MED ORDER — ROCURONIUM BROMIDE 50 MG/5ML IV SOLN
INTRAVENOUS | Status: AC
  Administered 2014-06-08: 50 mg
  Filled 2014-06-08: qty 2

## 2014-06-08 MED ORDER — MIDAZOLAM HCL 2 MG/2ML IJ SOLN
INTRAMUSCULAR | Status: AC
  Administered 2014-06-08: 4 mg
  Filled 2014-06-08: qty 4

## 2014-06-08 MED ORDER — FOLIC ACID 5 MG/ML IJ SOLN
1.0000 mg | Freq: Every day | INTRAMUSCULAR | Status: DC
  Administered 2014-06-08 – 2014-06-22 (×15): 1 mg via INTRAVENOUS
  Filled 2014-06-08 (×17): qty 0.2

## 2014-06-08 MED ORDER — DEXTROSE 5 % IV SOLN
0.0000 ug/min | INTRAVENOUS | Status: DC
  Administered 2014-06-08: 20 ug/min via INTRAVENOUS
  Administered 2014-06-09: 40 ug/min via INTRAVENOUS
  Administered 2014-06-09: 35 ug/min via INTRAVENOUS
  Administered 2014-06-09: 15 ug/min via INTRAVENOUS
  Administered 2014-06-10 – 2014-06-11 (×9): 50 ug/min via INTRAVENOUS
  Administered 2014-06-11: 25 ug/min via INTRAVENOUS
  Administered 2014-06-12: 20 ug/min via INTRAVENOUS
  Administered 2014-06-17: 50 ug/min via INTRAVENOUS
  Administered 2014-06-17: 20 ug/min via INTRAVENOUS
  Filled 2014-06-08 (×21): qty 1

## 2014-06-08 MED ORDER — FUROSEMIDE 10 MG/ML IJ SOLN
40.0000 mg | Freq: Once | INTRAMUSCULAR | Status: AC
  Administered 2014-06-08: 40 mg via INTRAVENOUS
  Filled 2014-06-08: qty 4

## 2014-06-08 MED ORDER — POLYSACCHARIDE IRON COMPLEX 150 MG PO CAPS
150.0000 mg | ORAL_CAPSULE | Freq: Every day | ORAL | Status: DC
  Administered 2014-06-09 – 2014-06-10 (×2): 150 mg
  Filled 2014-06-08 (×3): qty 1

## 2014-06-08 MED ORDER — LORAZEPAM 2 MG/ML IJ SOLN
1.0000 mg | INTRAMUSCULAR | Status: DC | PRN
  Administered 2014-06-08 (×2): 2 mg via INTRAVENOUS
  Filled 2014-06-08: qty 1

## 2014-06-08 MED ORDER — FENTANYL BOLUS VIA INFUSION
50.0000 ug | INTRAVENOUS | Status: DC | PRN
  Administered 2014-06-08: 100 ug via INTRAVENOUS
  Filled 2014-06-08: qty 100

## 2014-06-08 MED ORDER — DEXTROSE 5 % IV SOLN
1.0000 g | Freq: Three times a day (TID) | INTRAVENOUS | Status: DC
  Administered 2014-06-08 – 2014-06-14 (×19): 1 g via INTRAVENOUS
  Filled 2014-06-08 (×21): qty 1

## 2014-06-08 MED ORDER — CETYLPYRIDINIUM CHLORIDE 0.05 % MT LIQD
7.0000 mL | Freq: Two times a day (BID) | OROMUCOSAL | Status: DC
  Administered 2014-06-08: 7 mL via OROMUCOSAL

## 2014-06-08 MED ORDER — CHLORHEXIDINE GLUCONATE 0.12 % MT SOLN
15.0000 mL | Freq: Two times a day (BID) | OROMUCOSAL | Status: DC
  Administered 2014-06-08 – 2014-07-07 (×59): 15 mL via OROMUCOSAL
  Filled 2014-06-08 (×61): qty 15

## 2014-06-08 NOTE — Progress Notes (Addendum)
PROGRESS NOTE  Jerome [name] WUJ:[id number] DOB: 1975-09-30 DOA: 06/05/2014 PCP: PROVIDER NOT IN SYSTEM  HPI: 38 yo M with ROH abuse, liver cirrhosis, CKD III admitted with anemia.   Subjective/ 24 H Interval events - overnight events noted, patient with acute hypoxic respiratory failure, increased ETOH withdrawal and in need for transfer to SDU, BiPAP and Precedex gtt  Assessment/Plan: Acute hypoxic respiratory failure - multifactorial with evidence of PNA on CXR yesterday, cough, ?TRALI s/p 5U pRBC vs volume overload - CXR this morning with significant increase airspace opacities bilaterally R>L, - broaden antibiotics to Vancomycin and Cefepime for PNA - agree with r/o flu - CXR concerning, PCCM consulted  - BP stable, Lasix 40 mg IV at 6 am this morning ETOH withdrawal - patient's last drink supposedly 6 days ago per his accounts, but probably sooner given acute withdrawal  - continue Precedex Anemia - hemoglobin 4.5 on admission, requiring 5U pRBC total most recent on yesterday noon - multifactorial due to liver disease with ongoing ETOH use, CKD, known varices. 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 Renal function is normal. Sees outpatient nephrologist   Diet: soft Fluids: none DVT Prophylaxis: SCDs  Code Status: Full Family Communication: none this morning  Disposition Plan: home when ready  Consultants:  GI  PCCM  Procedures:  None    Antibiotics Levofloxacin 10/11 >>  10/12 Vancomycin 10/12 >> Cefepime 10/12 >>  Studies  Filed Vitals:   06/08/14 0135 06/08/14 0334 06/08/14 0400 06/08/14 0600  BP: 141/90 115/57 128/62 139/81  Pulse: 97 92 87 99  Temp: 98.3 F (36.8 C)     TempSrc: Oral     Resp: 36 26 23 21   Height:      Weight:      SpO2: 90% 99% 99% 97%    Intake/Output Summary (Last 24 hours) at 06/08/14 0705 Last data filed at 06/07/14 2158  Gross per 24 hour  Intake    330 ml  Output     90 ml  Net    240 ml   Filed Weights   06/05/14 1810  Weight: 94.4 kg (208 lb 1.8 oz)   Exam:  General:  NAD  Cardiovascular: RRR  Respiratory: CTA biL  Abdomen: soft, non tender  MSK: no edema  Data Reviewed: Basic Metabolic Panel:  Recent Labs Lab 06/05/14 1319 06/06/14 0555  NA 142 143  K 3.5* 3.8  CL 106 109  CO2 21 20  GLUCOSE 111* 129*  BUN 6 6  CREATININE 0.62 0.78  CALCIUM 7.9* 7.3*   Liver Function Tests:  Recent Labs Lab 06/05/14 1319 06/06/14 0555  AST 90* 96*  ALT 17 16  ALKPHOS 188* 174*  BILITOT 2.0* 3.1*  PROT 7.1 6.9  ALBUMIN 2.7* 2.7*    Recent Labs Lab 06/05/14 1322 06/07/14 2359  AMMONIA 76* 74*   CBC:  Recent Labs Lab 06/05/14 1319 06/06/14 0555 06/06/14 1255 06/07/14 0500  WBC 8.4 8.8 10.5 12.3*  NEUTROABS 6.0  --   --   --   HGB 4.5* 6.4* 7.1* 7.6*  HCT 15.5* 21.1* 23.3* 24.9*  MCV 74.9* 78.7 79.5 79.0  PLT 157 155 167 183   CBG:  Recent Labs Lab 06/07/14 1146 06/07/14 1649 06/07/14 2012 06/07/14 2337 06/08/14 0507  GLUCAP 109* 86 107* 97 88    Recent Results (from the past 240 hour(s))  MRSA PCR SCREENING     Status: Abnormal   Collection Time    06/05/14  6:45 PM      Result Value Ref Range Status   MRSA by PCR POSITIVE (*) NEGATIVE Final   Comment:            The GeneXpert MRSA Assay (FDA     approved for NASAL specimens     only), is one component of a     comprehensive MRSA colonization     surveillance program. It is not     intended to diagnose MRSA      infection nor to guide or     monitor treatment for     MRSA infections.     RESULT CALLED TO, READ BACK BY AND VERIFIED WITH:     C.AYERS,RN AT 2213 ON 06/05/14 BY SHEAW  CULTURE, EXPECTORATED SPUTUM-ASSESSMENT     Status: None   Collection Time    06/07/14 10:01 PM      Result Value Ref Range Status   Specimen Description SPUTUM   Final   Special Requests NONE   Final   Sputum evaluation     Final   Value: MICROSCOPIC FINDINGS SUGGEST THAT THIS SPECIMEN IS NOT REPRESENTATIVE OF LOWER RESPIRATORY SECRETIONS. PLEASE RECOLLECT.   Report Status 06/07/2014 FINAL   Final     Studies: [place] Abdomen Complete 06/05/2014 There is hepatomegaly and splenomegaly consistent with parenchymal liver disease. The echotexture of the liver is inhomogeneous consistent with the underlying parenchymal disease. While no focal liver lesions are identified, it must be cautioned that the sensitivity of ultrasound for focal liver lesions is diminished significantly in this circumstance. Note that there is recanalization of the umbilical vein, a finding consistent with cirrhosis.  Sludge in the gallbladder with borderline gallbladder wall thickening. No gallstones seen. If there is concern for acalculous cholecystitis, correlation with nuclear medicine hepatobiliary imaging study to assess for cystic duct patency would be reasonable.  Portions of the pancreas are obscured by gas. Visualized portions of pancreas appear normal.  No demonstrable ascites.  Scheduled Meds: . sodium chloride   Intravenous Once  . sodium chloride   Intravenous Once  . antiseptic oral rinse  7 mL Mouth Rinse q12n4p  . antiseptic oral rinse  7 mL Mouth Rinse q12n4p  . Chlorhexidine Gluconate Cloth  6 each Topical Q0600  . insulin aspart  0-9 Units Subcutaneous TID WC & HS  . insulin glargine  15 Units Subcutaneous Q breakfast  . iron polysaccharides  150 mg Oral Daily  . levofloxacin (LEVAQUIN) IV  750 mg Intravenous Q1200  . magnesium  oxide  400 mg Oral BID  . metoprolol  5 mg Intravenous 3 times per day  . mupirocin ointment  1 application Nasal BID  . pantoprazole  40 mg Oral BID AC  . potassium chloride  40 mEq Oral Once  . sodium chloride  3 mL Intravenous Q12H   Continuous Infusions: . dexmedetomidine 0.4 mcg/kg/hr (06/08/14 [id number])    Principal Problem:   Acute blood loss anemia Active Problems:   Diabetes mellitus   Hypertension   Alcohol abuse   Anemia   Protein-calorie malnutrition, severe  Chronic kidney disease (CKD), stage III (moderate)   Type 2 diabetes mellitus with stage 3 chronic kidney disease   Esophageal varices in alcoholic cirrhosis   CAP (community acquired pneumonia)  Time spent: 7235  Pamella Pertostin Torry Adamczak, MD [facility] Pager [phone]. If 7 PM - 7 AM, please contact night-coverage at [email or web address], password [email or web address] 06/08/2014, 7:05 AM  LOS: 3 days

## 2014-06-08 NOTE — Progress Notes (Signed)
INITIAL NUTRITION ASSESSMENT  DOCUMENTATION CODES Per approved criteria  -Not Applicable   INTERVENTION:  Recommend initiation of enteral nutrition through OG tube with Vital AF 1.2 at 20 ml/hr and increase 10 ml every 4 hours to goal rate of 60 ml/hr to provide 1728 kcal, 108 gm protein and 1167 ml free water daily.  With propofol, patient will meet 100% estimated kcal and protein needs.  Current propofol at 20 ml/hr providing 528 kcal daily.    RD to monitor closely  NUTRITION DIAGNOSIS: Inadequate oral intake related to inability to eat as evidenced by npo status.  Goal: Diet advancement.  Patient to meet estimated needs with meals and supplements.  Monitor:  Diet advancement, labs, weight trend  Reason for Assessment: Consult for assessment of nutritional status/needs  38 y.o. male  Admitting Dx: Acute blood loss anemia  ASSESSMENT: Patient admitted with acute blood loss anemia, etoh abuse and withdrawal, acute hypoxic respiratory failure on c pap.  Diabetes, uncontrolled with nephropathy.  CKD stage III.    10/12: NPO on c pap.  Currently being intubated. Ate 1/2 omelet Saturday.  Mother reports that patient has been afraid of eating for some time for fear of vomiting.  Patient last seen by RD 03/20/14.  Diagnosed with severe malnutrition at that time due to weight loss and poor intake.  Weight has increased from 190 lbs in July to 208 lbs currently.  Probably fluid gain.   Intake has been poor prior to admit.   Patient is currently intubated on ventilator support MV: 11.6 L/min Temp (24hrs), Avg:99 F (37.2 C), Min:98.3 F (36.8 C), Max:99.7 F (37.6 C)  Propofol: 20 ml/hr   Height: Ht Readings from Last 1 Encounters:  06/05/14 5\' 10"  (1.778 m)    Weight: Wt Readings from Last 1 Encounters:  06/05/14 208 lb 1.8 oz (94.4 kg)    Ideal Body Weight: 166 lbs  % Ideal Body Weight: 125  Wt Readings from Last 10 Encounters:  06/05/14 208 lb 1.8 oz (94.4  kg)  03/19/14 190 lb (86.183 kg)  03/19/14 190 lb (86.183 kg)    Usual Body Weight: 205 lbs  % Usual Body Weight: 101  BMI:  Body mass index is 29.86 kg/(m^2).  Estimated Nutritional Needs: Kcal: 2232 Protein: 95-105 Fluid: 2.4L  Skin: intact  Diet Order: NPO  EDUCATION NEEDS: -No education needs identified at this time   Intake/Output Summary (Last 24 hours) at 06/08/14 1037 Last data filed at 06/08/14 0837  Gross per 24 hour  Intake    380 ml  Output    790 ml  Net   -410 ml    Last BM: unknown   Labs:   Recent Labs Lab 06/05/14 1319 06/06/14 0555 06/08/14 0745  NA 142 143 141  K 3.5* 3.8 3.3*  CL 106 109 108  CO2 21 20 20   BUN 6 6 8   CREATININE 0.62 0.78 0.71  CALCIUM 7.9* 7.3* 7.7*  GLUCOSE 111* 129* 108*    CBG (last 3)   Recent Labs  06/07/14 2337 06/08/14 0507 06/08/14 0741  GLUCAP 97 88 119*    Scheduled Meds: . sodium chloride   Intravenous Once  . sodium chloride   Intravenous Once  . antiseptic oral rinse  7 mL Mouth Rinse q12n4p  . antiseptic oral rinse  7 mL Mouth Rinse q12n4p  . ceFEPime (MAXIPIME) IV  1 g Intravenous Q8H  . Chlorhexidine Gluconate Cloth  6 each Topical Q0600  . insulin aspart  0-9 Units Subcutaneous TID WC & HS  . insulin glargine  15 Units Subcutaneous Q breakfast  . iron polysaccharides  150 mg Oral Daily  . magnesium oxide  400 mg Oral BID  . metoprolol  5 mg Intravenous 3 times per day  . mupirocin ointment  1 application Nasal BID  . pantoprazole (PROTONIX) IV  40 mg Intravenous Q12H  . potassium chloride  40 mEq Oral Once  . sodium chloride  3 mL Intravenous Q12H  . vancomycin  1,000 mg Intravenous Q8H    Continuous Infusions: . dexmedetomidine Stopped (06/08/14 0849)    Past Medical History  Diagnosis Date  . Diabetes mellitus without complication   . Hypertension     Past Surgical History  Procedure Laterality Date  . Esophagogastroduodenoscopy N/A 03/20/2014    Procedure:  ESOPHAGOGASTRODUODENOSCOPY (EGD);  Surgeon: Shirley FriarVincent C. Schooler, MD;  [facility];  Service: Endoscopy;  Laterality: N/A;    Oran ReinLaura Andric Kerce, RD, LDN Clinical Inpatient Dietitian Pager:  [phone] Weekend and after hours pager:  [phone]

## 2014-06-08 NOTE — Progress Notes (Signed)
CARE MANAGEMENT NOTE 06/08/2014  Patient:  Jerome [name],Jerome [name]   Account Number:  [id number]  Date Initiated:  06/08/2014  Documentation initiated by:  Lucille Crichlow  Subjective/Objective Assessment:   etoh abuse and resp insufficiency     Action/Plan:   bipap -vent [id number]   Anticipated DC Date:  06/11/2014   Anticipated DC Plan:  HOME/SELF CARE  In-house referral  Clinical Social Worker      DC Planning Services  CM consult      Choice offered to / List presented to:             Status of service:  In process, will continue to follow Medicare Important Message given?   (If response is "NO", the following Medicare IM given date fields will be blank) Date Medicare IM given:   Medicare IM given by:   Date Additional Medicare IM given:   Additional Medicare IM given by:    Discharge Disposition:    Per UR Regulation:  Reviewed for med. necessity/level of care/duration of stay  If discussed at Long Length of Stay Meetings, dates discussed:    Comments:  [id number]/Jerome Oblinger Earlene Plateravis, RN, BSN, CCM Chart reviewed. Discharge needs and patient'[name] stay to be reviewed and followed by case manager. Patient intubated [id number] am.

## 2014-06-08 NOTE — Procedures (Signed)
Central Venous Catheter Insertion Procedure Note Jerome BodyWilliam [name] [id number] 10/31/1975  Procedure: Insertion of Central Venous Catheter Indications: Assessment of intravascular volume, Drug and/or fluid administration and Frequent blood sampling  Procedure Details Consent: Risks of procedure as well as the alternatives and risks of each were explained to the (patient/caregiver).  Consent for procedure obtained. Time Out: Verified patient identification, verified procedure, site/side was marked, verified correct patient position, special equipment/implants available, medications/allergies/relevent history reviewed, required imaging and test results available.  Performed  Maximum sterile technique was used including antiseptics, cap, gloves, gown, hand hygiene, mask and sheet. Skin prep: Chlorhexidine; local anesthetic administered A antimicrobial bonded/coated triple lumen catheter was placed in the left internal jugular vein using the Seldinger technique.  Evaluation Blood flow good Complications: No apparent complications Patient did tolerate procedure well. Chest X-ray ordered to verify placement.  CXR: pending.  Jerome [name],Jerome [name] 06/08/2014, 12:22 PM  I was present for procedure.  Jerome HellingVineet [name], MD [facility] 06/08/2014, 12:27 PM Pager:  [phone] After 3pm call: [phone]

## 2014-06-08 NOTE — Progress Notes (Signed)
VSS.  Pt sedated, but per RN, no visible bleeding.  Hgb from today 8.2, up from 7.1 two days ago, and 7.6 yesterday.  Has apparently received 4 units of prc's since adm, at which time hgb was 4.5.  BUN not rechecked today, but has been nl previously.  IMPR:    1.  Probable chronic blood loss iron deficiency anemia 2.  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  Will follow at a distance with you; call at any time in the event of acute bleeding  2.  Prior to dischg, consider colonoscopy to look for concurrent lower tract source of bld loss (unlikely, given his young age).  Florencia Reasonsobert V. Ashe Graybeal, M.D. [phone]

## 2014-06-08 NOTE — Procedures (Signed)
Intubation Procedure Note Dion BodyWilliam Kadow [id number] 07/24/1976  Procedure: Intubation Indications: Respiratory insufficiency  Procedure Details Consent: Unable to obtain consent because of emergent medical necessity. Time Out: Verified patient identification, verified procedure, site/side was marked, verified correct patient position, special equipment/implants available, medications/allergies/relevent history reviewed, required imaging and test results available.  Performed  Maximum sterile technique was used including gloves, gown, hand hygiene and mask.  MAC and 3    Evaluation Hemodynamic Status: BP stable throughout; O2 sats: stable throughout Patient's Current Condition: stable Complications: No apparent complications Patient did tolerate procedure well. Chest X-ray ordered to verify placement.  CXR: pending.   Performed by Anders SimmondsPete Babcock, ACNP.  I was present for procedure.  Coralyn HellingVineet Joeline Freer, MD [facility] 06/08/2014, 11:34 AM Pager:  [phone] After 3pm call: [phone]

## 2014-06-08 NOTE — Progress Notes (Addendum)
Received call from Lab that patient's arterial P02 was less than reportable range. Alvester MorinNewton, MD made aware of critical lab value. Order for transfer to Step Down placed. Patient becoming restless and taking nasal cannula off. Will transfer patient to bed 1229. Voicemail left for family member informing them of patient's transfer.

## 2014-06-08 NOTE — Progress Notes (Signed)
eLink Physician-Brief Progress Note Patient Name: Jerome BodyWilliam Speegle DOB: 06/28/1976 MRN: [id number]   Date of Service  06/08/2014  HPI/Events of Note  On bipap, requiring increasing ativan for withdrawal  eICU Interventions  Precedex if requires more than 1-4 mg q 2h ativan Npo while on bipap Dc losartan, lasix 40 x 1, lopressor iV with parameters     Intervention Category Major Interventions: Respiratory failure - evaluation and management  ALVA,RAKESH V. 06/08/2014, 5:25 AM

## 2014-06-08 NOTE — Progress Notes (Signed)
Pt placed on NIV BiPAP.  Pt resting comfortably at this time.  Pt requires mild sedation to tolerate the mask.  Advise continuing mild sedation until the Pt's condition improves.  Pt's leakage is 36%.  Machine plugged into red outlet, Pt wearing Medium full face mask.

## 2014-06-08 NOTE — Consult Note (Signed)
PULMONARY / CRITICAL CARE MEDICINE  Name: Jerome [name] MRN: [id number] DOB:  10/28/1975 ADMISSION DATE: 06/05/2014  CONSULTATION DATE: 06/08/2014  REFERRING MD : TRH  CHIEF COMPLAINT: Respiratory Failure INITIAL PRESENTATION:  38 y.o. M [facility] with SOB and dizziness. In ED, hgb 4.5 and requiring 5 units PRBC since 10/9. Acute hypoxic RF on 10/12 requring biPAP.    SIGNIFICANT EVENTS:  10/9: admitted; PRBC x3 10/10 PRBC x1 10/11: PRBC x1; new onset PNA on chest x ray 10/12: acute hypoxic respiratory failure overnight requiring BiPAP. Alcohol withdrawal requiring precedex gtt  HISTORY OF PRESENT ILLNESS:   Jerome BodyWilliam Cope is a 38 y.o. male admitted for anemia. Hgb 4.5 in ED.  He has had a couple weeks' worth of progressive fatigue and poor energy.  He has history of alcohol-mediated cirrhosis, sees nephrologists outpatient and was most recently admitted for anemia and bleeding in late July. July endoscopy showed non-bleeding esophageal varices as well as portal gastropathy.  He continues to drink alcohol, 4-5 drinks every couple days. Admitted to the medical service. Treated w/ transfusions (5 units total PRBCs), medical care and monitoring. Developed ETOH w/d on 10/12 w  progressive resp distress on 10/12 w/ new PNA on CXR. PCCM asked to see as pt requiring NIPPV.    Past Medical History  Diagnosis Date  . Diabetes mellitus without complication   . Hypertension     Past Surgical History  Procedure Laterality Date  . Esophagogastroduodenoscopy N/A 03/20/2014    Procedure: ESOPHAGOGASTRODUODENOSCOPY (EGD);  Surgeon: Shirley FriarVincent C. Schooler, MD;  [facility];  Service: Endoscopy;  Laterality: N/A;    Prior to Admission medications   Medication Sig Start Date End Date Taking? Authorizing Provider  albuterol (PROVENTIL HFA;VENTOLIN HFA) 108 (90 BASE) MCG/ACT inhaler Inhale 1-2 puffs into the lungs every 6 (six) hours as needed for wheezing or shortness of breath  (shortness of breath).   Yes Historical Provider, MD  dexlansoprazole (DEXILANT) 60 MG capsule Take 60 mg by mouth daily.   Yes Historical Provider, MD  insulin glargine (LANTUS) 100 UNIT/ML injection Inject 15 Units into the skin daily with breakfast.   Yes Historical Provider, MD  LORazepam (ATIVAN) 1 MG tablet Take 1 mg by mouth 2 (two) times daily as needed for anxiety (anxiety).    Yes Historical Provider, MD  losartan (COZAAR) 50 MG tablet Take 50 mg by mouth daily.   Yes Historical Provider, MD  magnesium oxide (MAG-OX) 400 MG tablet Take 400 mg by mouth 2 (two) times daily.   Yes Historical Provider, MD  metoprolol tartrate (LOPRESSOR) 25 MG tablet Take 25 mg by mouth 2 (two) times daily.   Yes Historical Provider, MD    Allergies as of 06/05/2014  . (No Known Allergies)    History reviewed. No pertinent family history.  History   Social History  . Marital Status: Single    Spouse Name: N/A    Number of Children: N/A  . Years of Education: N/A   Occupational History  . Not on file.   Social History Main Topics  . Smoking status: Never Smoker   . Smokeless tobacco: Never Used  . Alcohol Use: Yes     Comment: pint daily-in rehab as of yesterday (03/18/14)  . Drug Use: No  . Sexual Activity: Not on file   Other Topics Concern  . Not on file   Social History Narrative  . No narrative on file   Subjective  Physical Exam: Vital signs  in last 24 hours: Temp:  [98.3 F (36.8 C)-99.7 F (37.6 C)] 99.7 F (37.6 C) (10/12 0800) Pulse Rate:  [75-110] 75 (10/12 0800) Resp:  [20-36] 26 (10/12 0800) BP: (115-159)/(57-90) 118/59 mmHg (10/12 0800) SpO2:  [86 %-99 %] 97 % (10/12 0800) FiO2 (%):  [50 %-60 %] 50 % (10/12 0753) Last BM Date: 06/07/14  PHYSICAL EXAMINATION:  General: Chronically ill appearing in NAD.  Neuro: Lethargic, follows commands. Generalized weakness. Not encephalopathic.  HEENT:  Cardiovascular: rrr, SVT intermittently x1  Lungs:BiPAP scattered  rhonchi.  Abdomen: Soft, non-tender + bowel sounds. No obvious ascites.  Musculoskeletal:  Skin: warm and dry. No edema.  Lab Results:  Recent Labs  06/06/14 1255 06/07/14 0500 06/08/14 0745  WBC 10.5 12.3* 11.4*  HGB 7.1* 7.6* 8.2*  HCT 23.3* 24.9* 26.7*  PLT 167 183 174   BMET  Recent Labs  06/05/14 1319 06/06/14 0555 06/08/14 0745  NA 142 143 141  K 3.5* 3.8 3.3*  CL 106 109 108  CO2 21 20 20   GLUCOSE 111* 129* 108*  BUN 6 6 8   CREATININE 0.62 0.78 0.71  CALCIUM 7.9* 7.3* 7.7*   LFT  Recent Labs  06/08/14 0745  PROT 6.6  ALBUMIN 2.6*  AST 74*  ALT 14  ALKPHOS 147*  BILITOT 3.4*   PT/INR  Recent Labs  06/05/14 1319  LABPROT 18.4*  INR 1.51*    Studies/Results: Dg Chest 2 View  06/07/2014   CLINICAL DATA:  Shortness of breath, cough, congestion, fever  EXAM: CHEST  2 VIEW  COMPARISON:  12/05/2011  FINDINGS: Borderline cardiomegaly. There is patchy airspace disease right perihilar and right right lower lobe highly suspicious for infiltrate/pneumonia. Streaky left lower lobe atelectasis or infiltrate. Old left rib fractures. No convincing pulmonary edema.  IMPRESSION: Patchy airspace disease right perihilar and right lower lobe highly suspicious for pneumonia. Streaky left lower lobe atelectasis or infiltrate. No convincing pulmonary edema. Old left rib fractures.   Electronically Signed   By: Natasha Mead M.D.   On: 06/07/2014 10:59   Dg Chest Port 1 View  06/08/2014   CLINICAL DATA:  Hypoxia. Airspace disease. Short of breath. Cough. Congestion. Fever.  EXAM: PORTABLE CHEST - 1 VIEW  COMPARISON:  06/07/2014  FINDINGS: Bilateral airspace filling has worsened, consistent with progressive pneumonia. There is more involvement of the right lung than the left. No collapse or effusion. Heart size and mediastinal shadows are within normal limits allowing for technical factors. Healed or healing rib fractures noted on the left. No acute bony finding.  IMPRESSION:  Worsening of bilateral airspace disease right worse than left consistent with bronchopneumonia.   Electronically Signed   By: Paulina Fusi M.D.   On: 06/08/2014 07:29   ASSESSMENT / PLAN:  PULMONARY  OETT 10/12 >> A:  Acute hypoxic respiratory failure TRALI vs PNA (s/p 5U PRBC) >> more likely PNA. Bilateral consolidation; R>L Gas exchange and CXR worse. P:  ABG now Intubate PAD protocol  monitor CXR and broaden antibiotics (see below)  CARDIOVASCULAR  A:  SVT intermittently  HTN P:  Replace mag and potassium as needed Continue home meds  RENAL  A:  CKDIII Hypokalemia P:  Trend chemistry  Avoid hypotension  Renal function WNL today, continue monitoring   GASTROINTESTINAL  A:  Severe protein calorie malnutrition  Cirrhosis of liver  Possible gastritis  Transaminitis (likely related to cirrhosis and alcohol consumption)  P:  Place NG; nutrition consult TF as per nutrition  Protonix IV  HEMATOLOGIC  A:  Anemia (slow bleeding portal gastropathy suspected); no overt bleeding P:  Active type and screen H/H q 12 Transfuse for hgb <7.0   INFECTIOUS  A:  Leukocytosis  MRSA nasal Right > left PNA. HCAP vs aspiration P:  BCx2 10/12>>>  UC 10/12>>>  Sputum 10/12>>>  Levofloxacin 10/11 >> 10/12  Vancomycin 10/12 >>  Cefepime 10/12 >> Isolation   ENDOCRINE  A: Hyperglycemia DMII with nephropathy P:  SSI ordered  NEUROLOGIC  A:  Alcohol withdrawal Acute encephalopathy  P:  Change to PAD protocol  RASS goal: -2  Supportive care   Add thiamine and folate   38 year old male w/ ETOH related cirrhosis and acute UGIB in setting of known esophageal varices and gastropathy. He's been treated supportively and is now s/p 5 units PRBC> Developed evidence of W/D and progressive respiratory failure on 10/11. Now w/ worsening R>L airspace disease c/w PNA vs evolving TRALI. Given his MS and radiographic progression best to intubate and support. ABX have been  widened. Family updated at bedside.   Family updated:  Parents updated at bedside 10/12.  Interdisciplinary meeting: Pending    LOS: 3 days   Naomie DeanBaker, Jillian K  06/08/2014, 9:10 AM  Reviewed above, examined.  38 yo male with hx of ETOH, cirrhosis, varices and progressive anemia.  He developed DT's, PNA (?aspiration vs HCAP), and acute hypoxic respiratory failure requiring intubation.  Will f/u Cx, and adjust Abx.  Continue precedex for sedation.  F/u CBC and transfuse for Hb < 7 or bleeding.  Continue protonix.  CC time 45 minutes.  Coralyn HellingVineet Jaelee Laughter, MD [facility] Pulmonary/Critical Care 06/08/2014, 11:50 AM Pager:  [phone] After 3pm call: [phone]

## 2014-06-08 NOTE — Progress Notes (Signed)
eLink Physician-Brief Progress Note Patient Name: Jerome BodyWilliam [name] DOB: 04/29/1976 MRN: [id number]   Date of Service  06/08/2014  HPI/Events of Note  ETOH cirrhosis, UGIB -4 u PRBCs, RLL pna ? Aspiration  -on bipap, ETOH withdrawal  eICU Interventions  Tolerating bipap for now Ativan seems to be controlling withdrawal   New ICU patient evaluation: This patient [facility]. I have reviewed relevant documentation including care plan & orders.    Intervention Category Evaluation Type: New Patient Evaluation  ALVA,RAKESH V. 06/08/2014, 2:04 AM

## 2014-06-08 NOTE — Progress Notes (Signed)
ANTIBIOTIC CONSULT NOTE - INITIAL  Pharmacy Consult for Vancomycin, Cefepime Indication: Sepsis  No Known Allergies  Patient Measurements: Height: 5\' 10"  (177.8 cm) Weight: 208 lb 1.8 oz (94.4 kg) IBW/kg (Calculated) : 73  Vital Signs: Temp: 98.3 F (36.8 C) (10/12 0135) Temp Source: Oral (10/12 0135) BP: 139/81 mmHg (10/12 0600) Pulse Rate: 99 (10/12 0600) Intake/Output from previous day: 10/11 0701 - 10/12 0700 In: 330 [Blood:330] Out: 90 [Urine:90] Intake/Output from this shift:    Labs:  Recent Labs  06/05/14 1319 06/06/14 0555 06/06/14 1255 06/07/14 0500  WBC 8.4 8.8 10.5 12.3*  HGB 4.5* 6.4* 7.1* 7.6*  PLT 157 155 167 183  CREATININE 0.62 0.78  --   --    Estimated Creatinine Clearance: 147.3 ml/min (by C-G formula based on Cr of 0.78). No results found for this basename: VANCOTROUGH, Leodis BinetVANCOPEAK, VANCORANDOM, GENTTROUGH, GENTPEAK, GENTRANDOM, TOBRATROUGH, TOBRAPEAK, TOBRARND, AMIKACINPEAK, AMIKACINTROU, AMIKACIN,  in the last 72 hours   Assessment: [age] yoM admitted 10/9 after being called by PCP that Hgb was 4. Pt admitted for GIB. Pt with Hx EtOH abuse, cirrhosis, bleeding esophageal varices, DM2, and CKD. Levaquin started for pneumonia since CXR showed R perihilar and lower lobe patchy airspace suspicious for pneumonia and patient was experiencing low-grade fever and leukocytosis.  Last night, PO2 was less than reportable range and patient was transferred to ICU-SD.  CXR this am with significant increase in airspace opacities bilaterally.  Pharmacy consulted to broaden antibiotics to vancomycin and cefepime.  Antiinfectives  10/11 >> levofloxacin x 1 10/12 >> vancomycin >> 10/12 >> cefepime >>  Labs / vitals Tmax: 99.6 WBCs: 12.3 (10/11, CBC today pending) Renal: SCr 0.78 (10/10), CrCl >100 ml/min (CG, normalized)  Microbiology 10/9 MRSA PCR: positive 10/11 sputum cx: ordered  Goal of Therapy:  Vancomycin trough 15-20 mcg/mL Doses adjusted per renal  function Eradication of infection  Plan:  1.  Vancomycin 1g IV q8h. 2.  Cefepime 1g IV q8h. 3.  F/u trough level, SCr, clinical course.  Clance BollAmanda Azaylea Maves, PharmD, BCPS Pager: [phone] 06/08/2014 7:43 AM

## 2014-06-09 ENCOUNTER — Inpatient Hospital Stay (HOSPITAL_COMMUNITY): Payer: BC Managed Care – PPO

## 2014-06-09 DIAGNOSIS — J9809 Other diseases of bronchus, not elsewhere classified: Secondary | ICD-10-CM

## 2014-06-09 LAB — BODY FLUID CELL COUNT WITH DIFFERENTIAL
EOS FL: 0 %
Lymphs, Fluid: 11 %
Monocyte-Macrophage-Serous Fluid: 87 % (ref 50–90)
Neutrophil Count, Fluid: 2 % (ref 0–25)
Total Nucleated Cell Count, Fluid: 387 cu mm (ref 0–1000)

## 2014-06-09 LAB — COMPREHENSIVE METABOLIC PANEL
ALBUMIN: 2.4 g/dL — AB (ref 3.5–5.2)
ALT: 15 U/L (ref 0–53)
AST: 85 U/L — ABNORMAL HIGH (ref 0–37)
Alkaline Phosphatase: 141 U/L — ABNORMAL HIGH (ref 39–117)
Anion gap: 12 (ref 5–15)
BUN: 8 mg/dL (ref 6–23)
CALCIUM: 7.1 mg/dL — AB (ref 8.4–10.5)
CO2: 21 meq/L (ref 19–32)
Chloride: 109 mEq/L (ref 96–112)
Creatinine, Ser: 0.66 mg/dL (ref 0.50–1.35)
GFR calc Af Amer: >90 mL/min (ref >90)
Glucose, Bld: 92 mg/dL (ref 70–99)
Potassium: 3.2 mEq/L — ABNORMAL LOW (ref 3.7–5.3)
SODIUM: 142 meq/L (ref 137–147)
TOTAL PROTEIN: 6.1 g/dL (ref 6.0–8.3)
Total Bilirubin: 2.4 mg/dL — ABNORMAL HIGH (ref 0.3–1.2)

## 2014-06-09 LAB — GLUCOSE, CAPILLARY
GLUCOSE-CAPILLARY: 85 mg/dL (ref 70–99)
GLUCOSE-CAPILLARY: 86 mg/dL (ref 70–99)
GLUCOSE-CAPILLARY: 86 mg/dL (ref 70–99)
Glucose-Capillary: 75 mg/dL (ref 70–99)
Glucose-Capillary: 92 mg/dL (ref 70–99)

## 2014-06-09 LAB — CBC
HCT: 25.2 % — ABNORMAL LOW (ref 39.0–52.0)
HEMOGLOBIN: 8 g/dL — AB (ref 13.0–17.0)
MCH: 25.9 pg — ABNORMAL LOW (ref 26.0–34.0)
MCHC: 31.7 g/dL (ref 30.0–36.0)
MCV: 81.6 fL (ref 78.0–100.0)
PLATELETS: 166 10*3/uL (ref 150–400)
RBC: 3.09 MIL/uL — ABNORMAL LOW (ref 4.22–5.81)
RDW: 19.4 % — ABNORMAL HIGH (ref 11.5–15.5)
WBC: 12.2 10*3/uL — AB (ref 4.0–10.5)

## 2014-06-09 LAB — VANCOMYCIN, TROUGH: Vancomycin Tr: <5 ug/mL — ABNORMAL LOW (ref 10.0–20.0)

## 2014-06-09 LAB — LACTATE DEHYDROGENASE, PLEURAL OR PERITONEAL FLUID: LD, Fluid: 89 U/L — ABNORMAL HIGH (ref 3–23)

## 2014-06-09 LAB — PROTEIN, BODY FLUID: TOTAL PROTEIN, FLUID: 1.9 g/dL

## 2014-06-09 LAB — GLUCOSE, SEROUS FLUID: Glucose, Fluid: 98 mg/dL

## 2014-06-09 LAB — MAGNESIUM: MAGNESIUM: 1.5 mg/dL (ref 1.5–2.5)

## 2014-06-09 LAB — PATHOLOGIST SMEAR REVIEW

## 2014-06-09 LAB — POTASSIUM: Potassium: 3.5 mEq/L — ABNORMAL LOW (ref 3.7–5.3)

## 2014-06-09 MED ORDER — PRO-STAT SUGAR FREE PO LIQD
30.0000 mL | Freq: Every morning | ORAL | Status: DC
  Administered 2014-06-10: 30 mL
  Filled 2014-06-09 (×3): qty 30

## 2014-06-09 MED ORDER — POTASSIUM CHLORIDE 20 MEQ/15ML (10%) PO LIQD
40.0000 meq | Freq: Once | ORAL | Status: AC
  Administered 2014-06-09: 40 meq
  Filled 2014-06-09: qty 30

## 2014-06-09 MED ORDER — VITAMINS A & D EX OINT
TOPICAL_OINTMENT | CUTANEOUS | Status: AC
  Administered 2014-06-09: 5 via TOPICAL
  Filled 2014-06-09: qty 10

## 2014-06-09 MED ORDER — VITAL AF 1.2 CAL PO LIQD
20.0000 mL/h | ORAL | Status: DC
  Administered 2014-06-09: 20 mL/h
  Administered 2014-06-09: 30 mL/h
  Administered 2014-06-10: 25 mL/h
  Filled 2014-06-09 (×3): qty 1000

## 2014-06-09 MED ORDER — VITAL HIGH PROTEIN PO LIQD: 1000.0000 mL | ORAL | Status: DC

## 2014-06-09 MED ORDER — POTASSIUM CHLORIDE 10 MEQ/50ML IV SOLN
10.0000 meq | INTRAVENOUS | Status: AC
  Administered 2014-06-09 (×4): 10 meq via INTRAVENOUS
  Filled 2014-06-09: qty 50

## 2014-06-09 MED ORDER — VANCOMYCIN HCL 10 G IV SOLR
1250.0000 mg | Freq: Three times a day (TID) | INTRAVENOUS | Status: DC
  Administered 2014-06-10 – 2014-06-11 (×6): 1250 mg via INTRAVENOUS
  Filled 2014-06-09 (×6): qty 1250

## 2014-06-09 NOTE — Care Management Note (Addendum)
Page 1 of 2   07/07/2014     3:18:30 PM CARE MANAGEMENT NOTE 07/07/2014  Patient:  Dion BodyCOLSTON,Makani   Account Number:  [id number]  Date Initiated:  06/09/2014  Documentation initiated by:  Traves Majchrzak  Subjective/Objective Assessment:   Bronchoscopy  Indications: Pneumonia with respiratory failure and mucus plugging on Rt/Vineet Craige CottaSood, MD Physician Signed Critical Care Procedures Service date: 06/09/2014 10:08 AM   Thoracentesis Procedure Note    intubated     Action/Plan:   tbd   Anticipated DC Date:  07/09/2014   Anticipated DC Plan:  HOME W HOME HEALTH SERVICES  In-house referral  Clinical Social Worker      DC Planning Services  CM consult      Choice offered to / List presented to:  C-1 Patient           HH [facility]   Status of service:  In process, will continue to follow Medicare Important Message given?   (If response is "NO", the following Medicare IM given date fields will be blank) Date Medicare IM given:   Medicare IM given by:   Date Additional Medicare IM given:   Additional Medicare IM given by:    Discharge Disposition:    Per UR Regulation:  Reviewed for med. necessity/level of care/duration of stay  If discussed at Long Length of Stay Meetings, dates discussed:   06/11/2014  06/16/2014  06/18/2014  06/22/2014  06/23/2014  06/25/2014  06/30/2014  07/02/2014  07/07/2014    Comments:  [id number]/Graceanne Guin,RN,BSN,CCM: patient transferred to 1422/spoke to patient and parents about next steps/will be going home with his parents that live in [place], [place] [facility] and also [facility] [place]no orders at this time-patient states that [facility] and does not wish to go there again/we openly discussed his life chances if he continues to drink heavily.  The patient states that he does  not want go back through the past month's experiences again.  [id number]/ chart note from FAO:ZHYQMVHCIR:Eugenia Dorothe PeaM Logue, RN/Rehab Admission Coordinator/Signed Physical Medicine and Rehabilitation/Progress Notes-07/03/2014  4:09 PM Rehab admissions - Evaluated for possible admission.  Please see rehab MD consult done today by Dr. Riley KillSwartz recommending SNF placement.  Lacks social supports that he would need for a successful inpatient rehab stay. Chart note from [place] [id number]: 1.Anemia from chronic GI bleeding   Hepatic coagulopathy 2.HCAP, NOS - treated Fevers, resolved 3.Trach site cellulitis, resolved 4.DMII, diet controlled Hyperglycemia, very well controlled 5.Acute encephalopathy, much improved Delirium tremens, resolved ICU associated discomfort Severe deconditioning P:   Begin to taper sedating meds slowly - risperdal dose decreased 11/08 Dispo - plan for SNF eventually vs home with PT CSW notified of need for SNF placement [id number] am Nasser Ku,RN,BSN,CCM  [id number]/tct-[facility]IR-Genie Loque/consult for cir ,pt and ot placed in system/per Genie no beds avialable and will have to do consult.  Dr. Allen KellYocoub notified./Konnie Noffsinger,RN,BSn,CCM [id number]/Irianna Gilday Earlene Plateravis, RN, BSN, CCM Chart reviewed. Discharge needs and patient's stay to be reviewed and followed by case manager. RASS goal 0 - cont Clonazepam to 3mg  tid - f/u later--> worried how he will do off precedex   - Risperdal 2mg  BID, continue to monitor QT-c daily - Continue fentanyl patch @ 2550mcg/hr - prn fentanyl pushes - 10/26 added Depakote   - Foot drop boots - PT consulted - continue Lactulose and rifaximin - get him OOB/  try to establish normal day/night routine. poss transfer to CIR once stsble  [id number]/chart note: Will maintain on precedex for now, goal is to liberate from the ventilator completely then send to inpatient rehab.  Attempting to decrease precedex dose as able and will continue current management.   [id number]/Keng Jewel  [id number]/Marwa Fuhrman Earlene Plateravis, RN, BSN, CCM Chart reviewed. Discharge needs and patient's stay to be reviewed and followed by case manager. is,RN,BSN,CCM: Acute encephalopathy 2nd to delirium tremens, respiratory failure, meds, critical illness.   This has been a major challenge during this hospitalization, continue precedex, ativan, fentanyl, valproate >11/1 after dropping fentanyl patch dose he is more awake but pulling at tubes and lines more; QTc 500 today P:   - RASS goal 0 -1   - Continue precedex gtt, max to 3.0, will titrate down once we have better control of his agitation   - Change ativan to clonazepam. - Continue clonidine - Risperdal 2mg  BID, continue to monitor QT-c daily - Continue fentanyl patch, increase to 50mcg today - prn fentanyl pushes - stop Haldol today due to QTc 500 - 10/26 added Depakote   - Foot drop boots - check ammonia - Lactulose and rifaximin  [id number]/Rhondda Arlett Goold,RN,BSN,CCM: [facility] will review chart and start insurance query. chart note: Family, parents,updated at bedside 10/28, 10/29 Discussion:   Agitation remains driving issue. Trying to achieve balance of adequate sedation with function. We will seek LTAC and family is OK with that plan. Follow fever curve, we may need to start abx and dc  Risperdal.  [id number]/Damondre Pfeifle Earlene Plateravis, RN, BSN, CCM Chart reviewed. Discharge needs and patient's stay to be reviewed and followed by case manager. trach with vent in place, hx of etoh and now requiring sedation and intubation/ From NP notes: Acute encephalopathy 2nd to delirium tremens, respiratory failure.Possible associted hepatic encephalopathy +  With high Ammonia. plan:[id number] RASS goal 0 Continuet versed gtt 10/19, add ativan but versed restarted, increase ativan to dc drip again Continue  LACTULOSE and XIFAXAN since  06/13/14 due to possible heaptic encephalopathy (unimpressed this is a major contributor), cosnider dc  xifaxan Continue precedex gtt but increase to max 2-2.4 Continue clonidine Does not respond well to Seroquel, change to Risperdal and increase to 2 tid if needed over next 48 hr, if fevers continued without source, dc. Fevers down will continue 10/26 Continue thiamine, folic acid Foot drop boots Fentanyl Add haldol after 12 lead ecg, if less 550(470) 10/26 add Depakote  [id number]/Avyukt Cimo Earlene Plateravis, RN, BSN, CCM Chart reviewed. Discharge needs and patient's stay to be reviewed and followed by case manager. Patient under went trach placement due to inability to wean from vent.  Will follow for progression and poss. placement.  No ltac benefits available at this time.  [id number]/Hasheem Voland Earlene Plateravis, RN, BSN, CCM Chart reviewed. Discharge needs and patient's stay to be reviewed and followed by case manager. Active weaning protocol started on [id number]/  06/13/2015&[id number]/Gioia Ranes Earlene Plateravis, RN, BSN, CCM Chart reviewed. Discharge needs and patient's stay to be reviewed and followed by case manager.

## 2014-06-09 NOTE — Progress Notes (Signed)
ANTIBIOTIC CONSULT NOTE -Addendem  Today is day #2 Vancomycin 1g q8h and Cefepime 1g q8h for sepsis 2/2 pna (HCAP vs aspiration). Pt underwent bronch and thoracentesis today. BAL cultures sent.   Vanc Trough<5  Goal of Therapy:  Vancomycin trough 15-20 mcg/mL  Doses adjusted per renal function  Eradication of infection   Plan:  1. Increase vanc to 1250mg  IV q8h.  2. Continue Cefepime 1g IV q8h.  3. F/u culture results 4  Recheck Vanc trough at steady state  Arley Phenixllen Taiten Brawn RPh 06/09/2014, 6:35 PM Pager [phone]

## 2014-06-09 NOTE — Procedures (Signed)
Thoracentesis Procedure Note  Pre-operative Diagnosis: Right pleural effusion in setting of pneumonia  Post-operative Diagnosis: same  Indications: Right pleural effusion in setting of pneumonia   Procedure Details  Consent: Informed consent was obtained. Risks of the procedure were discussed including: infection, bleeding, pain, pneumothorax.  Under sterile conditions the patient was positioned. Betadine solution and sterile drapes were utilized.  1% plain lidocaine was used to anesthetize the 7 rib space. Fluid was obtained without any difficulties and minimal blood loss.  A dressing was applied to the wound and wound care instructions were provided.   Findings 500 ml of clear, yellow pleural fluid was obtained.  Complications:  None; patient tolerated the procedure well.          Condition: stable  Plan Post-procedure chest xray pending Will send fluid for glucose, protein, LDH, cell count, and culture.  Performed by Anders SimmondsPete Babcock, ACNP with u/s guidance.  Attending Attestation: I was present and scrubbed for the entire procedure.   Coralyn HellingVineet Shaely Gadberry, MD [facility] Pulmonary/Critical Care 06/09/2014, 10:11 AM Pager:  [phone] After 3pm call: [phone]

## 2014-06-09 NOTE — Procedures (Signed)
Bronchoscopy Procedure Note Jerome BodyWilliam [name] [id number] 02/18/1976  Procedure: Bronchoscopy Indications: Pneumonia with respiratory failure and mucus plugging on Rt  Procedure Details Consent: Risks of procedure as well as the alternatives and risks of each were explained to the (patient/caregiver).  Consent for procedure obtained. Time Out: Verified patient identification, verified procedure, site/side was marked, verified correct patient position, special equipment/implants available, medications/allergies/relevent history reviewed, required imaging and test results available.  Performed  In preparation for procedure, patient was given 100% FiO2. Sedation: Diprivan  Bronchoscopy was entered through ETT.  Carina visualized.  Yellow secretions from airways suctioned.  Friable airways.  Lt upper, lingular, lower lobes visualized w/o evidence of endobronchial lesions.  Rt upper, middle, and lower lobes visualized w/o endobronchial lesions.  Bronchoscope wedged into Rt lower lobe with 60 ml saline instilled, and 15 ml of cloudy white to yellow fluid removed.  He had transient hypoxia during procedure that resolved after bronchoscope removed.  No other immediate complications.  Plan Will send BAL Rt lower lobe for quantitative culture Post-procedure CXR pending  Jerome HellingVineet Jerome Ayre, MD [facility] Pulmonary/Critical Care 06/09/2014, 10:29 AM Pager:  [phone] After 3pm call: [phone]

## 2014-06-09 NOTE — Progress Notes (Signed)
ANTIBIOTIC CONSULT NOTE - Follow Up  Pharmacy Consult for Vancomycin, Cefepime Indication: Sepsis  No Known Allergies  Patient Measurements: Height: 5\' 10"  (177.8 cm) Weight: 217 lb 13 oz (98.8 kg) IBW/kg (Calculated) : 73  Vital Signs: Temp: 99.7 F (37.6 C) (10/13 1115) Temp Source: Core (Comment) (10/13 0400) BP: 111/44 mmHg (10/13 1115) Pulse Rate: 71 (10/13 1115) Intake/Output from previous day: 10/12 0701 - 10/13 0700 In: 4155.5 [I.V.:3345.5; IV Piggyback:750] Out: 1780 [Urine:1780] Intake/Output from this shift: Total I/O In: 1251.2 [I.V.:801.2; IV Piggyback:450] Out: 325 [Urine:325]  Labs:  Recent Labs  06/07/14 0500 06/08/14 0745 06/09/14 0500  WBC 12.3* 11.4* 12.2*  HGB 7.6* 8.2* 8.0*  PLT 183 174 166  CREATININE  --  0.71 0.66   Estimated Creatinine Clearance: 150.4 ml/min (by C-G formula based on Cr of 0.66). No results found for this basename: VANCOTROUGH, Leodis BinetVANCOPEAK, VANCORANDOM, GENTTROUGH, GENTPEAK, GENTRANDOM, TOBRATROUGH, TOBRAPEAK, TOBRARND, AMIKACINPEAK, AMIKACINTROU, AMIKACIN,  in the last 72 hours   Assessment: [age] yoM admitted 10/9 after being called by PCP that Hgb was 4. Pt admitted for GIB. Pt with Hx EtOH abuse, cirrhosis, bleeding esophageal varices, DM2, and CKD. Levaquin started for pneumonia since CXR showed R perihilar and lower lobe patchy airspace suspicious for pneumonia and patient was experiencing low-grade fever and leukocytosis.  Last night, PO2 was less than reportable range and patient was transferred to ICU-SD.  CXR this am with significant increase in airspace opacities bilaterally.  Pharmacy consulted to broaden antibiotics to vancomycin and cefepime.  Antiinfectives  10/11 >> levofloxacin x 1 10/12 >> vancomycin >> 10/12 >> cefepime >>  Labs / vitals Tmax: 100 WBCs: mildly elevated Renal: SCr WNL/stable, CrCl >100 ml/min (CG, normalized)  Microbiology 10/9 MRSA PCR: positive 10/13 BAL cx: sent  Today is day #2  Vancomycin 1g q8h and Cefepime 1g q8h for sepsis 2/2 pna (HCAP vs aspiration). Pt underwent bronch and thoracentesis today.  BAL cultures sent.  Goal of Therapy:  Vancomycin trough 15-20 mcg/mL Doses adjusted per renal function Eradication of infection  Plan:  1.  Obtain vancomycin trough this evening. 2.  Continue Cefepime 1g IV q8h. 3.  F/u culture results.  Clance BollAmanda Ahnesti Townsend, PharmD, BCPS Pager: [phone] 06/09/2014 11:31 AM

## 2014-06-09 NOTE — Progress Notes (Signed)
eLink Physician-Brief Progress Note Patient Name: Jerome BodyWilliam Imran DOB: 10/29/1975 MRN: [id number]   Date of Service  06/09/2014  HPI/Events of Note    eICU Interventions  Hypokalemia -repleted      Intervention Category Intermediate Interventions: Electrolyte abnormality - evaluation and management  Doron Shake V. 06/09/2014, 6:07 AM

## 2014-06-09 NOTE — Progress Notes (Addendum)
PULMONARY / CRITICAL CARE MEDICINE  Name: Jerome [name] MRN: [id number] DOB:  [birth year] ADMISSION DATE: 06/05/2014  CONSULTATION DATE: 06/08/2014  REFERRING MD : TRH  CHIEF COMPLAINT: Respiratory Failure  INITIAL PRESENTATION:  38 y.o. M brought to [facility] ED on 10/9 with SOB and fatigue. In ED, hgb 4.5 and requiring 5 units PRBC since 10/9. Developed evidence of W/D and progressive respiratory failure on 10/11. 10/12 had worsening R>L airspace disease c/w PNA vs evolving TRALI. Given his MS and radiographic progression best to intubate and support. ABX were widened.   SIGNIFICANT EVENTS:  10/9: admitted; PRBC x3 10/10 PRBC x1 10/11: PRBC x1; new onset PNA on chest x ray 10/12: acute hypoxic respiratory failure overnight requiring BiPAP. Alcohol withdrawal requiring precedex gtt. Intubated for decreasing LOC and increasing 02 needs and worsening PNA on CXR.  10/13: patient increasing agitated on ventilator. Not tolerating less than 80% fio2.  STUDIES: 10/13 Rt thoracentesis > 500 ml fluid  Subjective Remains on increased PEEP, FiO2.  Physical Exam: Vital signs in last 24 hours: Temp:  [98.8 F (37.1 C)-100 F (37.8 C)] 99.9 F (37.7 C) (10/13 0715) Pulse Rate:  [67-140] 101 (10/13 0812) Resp:  [19-37] 22 (10/13 0812) BP: (88-185)/(36-117) 122/105 mmHg (10/13 0812) SpO2:  [91 %-100 %] 95 % (10/13 0812) FiO2 (%):  [70 %-100 %] 80 % (10/13 0812) Weight:  [98.8 kg (217 lb 13 oz)] 98.8 kg (217 lb 13 oz) (10/13 0500) Last BM Date: 06/07/14  PHYSICAL EXAMINATION:  General: Chronically ill appearing in NAD.  Neuro: Vented and sedated with RASS -2. Follows commands with propofol stopped.  HEENT: MMM Cardiovascular: RRR.  Lungs: scattered rhonchi. Diminished on right.  Abdomen: Soft, non-tender + bowel sounds. No obvious ascites.  Musculoskeletal: No focal deficits.  Skin: warm and dry. No edema.  Lab Results:  Recent Labs  06/07/14 0500 06/08/14 0745 06/09/14 0500  WBC  12.3* 11.4* 12.2*  HGB 7.6* 8.2* 8.0*  HCT 24.9* 26.7* 25.2*  PLT 183 174 166   BMET  Recent Labs  06/08/14 0745 06/09/14 0500  NA 141 142  K 3.3* 3.2*  CL 108 109  CO2 20 21  GLUCOSE 108* 92  BUN 8 8  CREATININE 0.71 0.66  CALCIUM 7.7* 7.1*   LFT  Recent Labs  06/09/14 0500  PROT 6.1  ALBUMIN 2.4*  AST 85*  ALT 15  ALKPHOS 141*  BILITOT 2.4*    Studies/Results: Dg Chest 2 View  06/07/2014   CLINICAL DATA:  Shortness of breath, cough, congestion, fever  EXAM: CHEST  2 VIEW  COMPARISON:  12/05/2011  FINDINGS: Borderline cardiomegaly. There is patchy airspace disease right perihilar and right right lower lobe highly suspicious for infiltrate/pneumonia. Streaky left lower lobe atelectasis or infiltrate. Old left rib fractures. No convincing pulmonary edema.  IMPRESSION: Patchy airspace disease right perihilar and right lower lobe highly suspicious for pneumonia. Streaky left lower lobe atelectasis or infiltrate. No convincing pulmonary edema. Old left rib fractures.   Electronically Signed   By: Natasha Mead M.D.   On: 06/07/2014 10:59   Dg Chest Port 1 View  06/09/2014   CLINICAL DATA:  Pneumonia.  EXAM: PORTABLE CHEST - 1 VIEW  COMPARISON:  06/08/2014  FINDINGS: Endotracheal tube, NG tube, and central catheter appear in good position. There is a new large right pleural effusion. Right perihilar infiltrate persists with consolidation posterior medially of both lung bases. I suspect the patient has a small left effusion as well. There is distention of  the azygos vein. Pulmonary vascularity is within normal limits.  IMPRESSION: New large right pleural effusion. Increased consolidation at the right lung base. The findings could represent pneumonia but the possibility atypical pulmonary edema should be considered.   Electronically Signed   By: Geanie CooleyJim  Maxwell M.D.   On: 06/09/2014 06:59   Dg Chest Port 1 View  06/08/2014   CLINICAL DATA:  Pneumonia. Evaluate recently placed support  apparatuses.  EXAM: PORTABLE CHEST - 1 VIEW  COMPARISON:  06/08/2014  FINDINGS: Endotracheal tube is 5.4 cm above the carina. Left jugular catheter has been placed. The tip of the catheter is overlying the right mainstem bronchus and appears to course below the azygos vein. Location of this catheter tip is uncertain. This could be within the upper SVC but cannot exclude arterial placement. No evidence for a pneumothorax. Persistent airspace densities in the central aspect of the right lung. Increased densities and consolidation at the left lung base. Heart size is grossly stable.  IMPRESSION: Placement of left jugular central line as described. Catheter tip could be in the upper SVC but cannot exclude arterial placement. Findings were called to the patient's nurse at 1:30 p.m. on 06/08/2014.  Negative for a pneumothorax.  Endotracheal tube is well positioned.  Persistent airspace disease in the central aspect of the right lung is compatible with history of pneumonia.  Increased densities at the left lung base.   Electronically Signed   By: Richarda OverlieAdam  Henn M.D.   On: 06/08/2014 13:34   Dg Chest Port 1 View  06/08/2014   CLINICAL DATA:  Hypoxia. Airspace disease. Short of breath. Cough. Congestion. Fever.  EXAM: PORTABLE CHEST - 1 VIEW  COMPARISON:  06/07/2014  FINDINGS: Bilateral airspace filling has worsened, consistent with progressive pneumonia. There is more involvement of the right lung than the left. No collapse or effusion. Heart size and mediastinal shadows are within normal limits allowing for technical factors. Healed or healing rib fractures noted on the left. No acute bony finding.  IMPRESSION: Worsening of bilateral airspace disease right worse than left consistent with bronchopneumonia.   Electronically Signed   By: Paulina FusiMark  Shogry M.D.   On: 06/08/2014 07:29   ASSESSMENT / PLAN:  PULMONARY  OETT 10/12 >> A:  Acute hypoxic respiratory failure PNA with R pleural effusion Bilateral consolidation;  R>L Orange secretions from ETT (presumed aspiration) Gas exchange and CXR worse. P:  Wean PEEP/FiO2 to keep SaO2 > 90% F/u CXR Bronchoscopy 10/13 F/u thoracentesis from 10/13  CARDIOVASCULAR  A:  Hypotension in setting of increased sedation and pain meds requiring vasopressors. P:  Phenylephrine for MAP >65 Replace mag and potassium as needed Hold home hypertensives  RENAL  A:  CKDIII Hypokalemia P:  Trend chemistry  Renal function WNL today, continue monitoring   GASTROINTESTINAL  A:  Severe protein calorie malnutrition  Cirrhosis of liver  Possible gastritis  Transaminitis (likely related to cirrhosis and alcohol consumption)  P:  Place NG; nutrition consult Start Tube feeds 10/13, per nutrition recommendations When tube feeds at goal, KVO fluids.  Continue protonix IV  HEMATOLOGIC  A:  Leukocytosis Anemia (slow bleeding portal gastropathy suspected); no overt bleeding P:  Active type and screen H/H q 12 Transfuse for hgb <7.0   INFECTIOUS  A:  Right > left PNA. HCAP vs aspiration Flu negative P:  BCx2 >>>  UC >>>  Sputum >>>  Levofloxacin 10/11 >> 10/12  Vancomycin 10/12 >>  Cefepime 10/12 >>  Isolation   ENDOCRINE  A:  Hyperglycemia DMII with nephropathy P:  SSI ordered  NEUROLOGIC  A:  Alcohol withdrawal Acute encephalopathy  P:  Change to PAD protocol  RASS goal: -2  Supportive care   thiamine and folate   Family updated:  Parents updated at bedside 10/12 and 10/13  Interdisciplinary meeting (due by day 10/19):  CC time 35 minutes.  Coralyn HellingVineet Sharrieff Spratlin, MD [facility] 06/09/2014, 10:15 AM Pager:  [phone] After 3pm call: [phone]

## 2014-06-09 NOTE — Progress Notes (Signed)
NUTRITION FOLLOW UP  Intervention:   -Recommend Vital AF 1.2 at 20 ml/hr and increase 10 ml every 4 hours to goal rate of 50 ml/hr with Prostat 30 ml once daily  -Regimen provides 1540 kcal, 105 gm protein and 973 ml free water daily -TF + propofol =  2512 kcal (100% est kcal needs), 105 gram protein (100% est protein needs)  Nutrition Dx:   Inadequate oral intake related to inability to eat as evidenced by npo status; ongoing   Goal:   Diet advancement. Patient to meet estimated needs with meals and supplements.   New Goal: TF to meet >/= 90% of their estimated nutrition needs    Monitor:   TF tolerance, total protein/energy intake, labs, weights, diet order, sedation  Assessment:   10/12:  NPO on c pap. Currently being intubated.  Ate 1/2 omelet Saturday. Mother reports that patient has been afraid of eating for some time for fear of vomiting.  Patient last seen by RD 03/20/14. Diagnosed with severe malnutrition at that time due to weight loss and poor intake.  Weight has increased from 190 lbs in July to 208 lbs currently. Probably fluid gain.  Intake has been poor prior to admit.  Patient is currently intubated on ventilator support  MV: 11.6 L/min  Temp (24hrs), Avg:99 F (37.2 C), Min:98.3 F (36.8 C), Max:99.7 F (37.6 C)   Propofol: 20 ml/hr   10/13: Patient remains intubated on ventilator support MV: 11.2 L/min Temp (24hrs), Avg:99.7 F (37.6 C), Min:99 F (37.2 C), Max:100.2 F (37.9 C)  Propofol: 36.8 ml/hr  Vital AF 1.2 initiated at 20 ml/hr on 10/13 with goal rate of 60 ml/hr. RD to order adult enteral protocol.  MD noted pt increasingly agitated on ventilator. Propofol level increased, providing approximately 972 kcal/day. Low K levels being repleted. Weight increased 8 lbs since admit, likely d/t fluid Pt s/p thoracentesis with 500 ml fluid removal Height: Ht Readings from Last 1 Encounters:  06/08/14 5\' 10"  (1.778 m)    Weight Status:   Wt  Readings from Last 1 Encounters:  06/09/14 217 lb 13 oz (98.8 kg)  06/05/14 208 lb  Re-estimated needs:  Kcal: 2310  Protein: 95-105  Fluid: 2.4L  Skin: WDL  Diet Order: NPO   Intake/Output Summary (Last 24 hours) at 06/09/14 1509 Last data filed at 06/09/14 1400  Gross per 24 hour  Intake 5696.52 ml  Output   1875 ml  Net 3821.52 ml    Last BM: 10/11   Labs:   Recent Labs Lab 06/06/14 0555 06/08/14 0745 06/08/14 0800 06/09/14 0500 06/09/14 1048  NA 143 141  --  142  --   K 3.8 3.3*  --  3.2* 3.5*  CL 109 108  --  109  --   CO2 20 20  --  21  --   BUN 6 8  --  8  --   CREATININE 0.78 0.71  --  0.66  --   CALCIUM 7.3* 7.7*  --  7.1*  --   MG  --   --  1.5  --  1.5  GLUCOSE 129* 108*  --  92  --     CBG (last 3)   Recent Labs  06/09/14 0416 06/09/14 0807 06/09/14 1154  GLUCAP 85 92 86    Scheduled Meds: . antiseptic oral rinse  7 mL Mouth Rinse QID  . ceFEPime (MAXIPIME) IV  1 g Intravenous Q8H  . chlorhexidine  15 mL Mouth  Rinse BID  . Chlorhexidine Gluconate Cloth  6 each Topical Q0600  . folic acid  1 mg Intravenous Daily  . insulin aspart  0-9 Units Subcutaneous 6 times per day  . iron polysaccharides  150 mg Per Tube Daily  . magnesium oxide  400 mg Per Tube BID  . mupirocin ointment  1 application Nasal BID  . pantoprazole (PROTONIX) IV  40 mg Intravenous Q12H  . sodium chloride  3 mL Intravenous Q12H  . thiamine IV  100 mg Intravenous Daily  . vancomycin  1,000 mg Intravenous Q8H    Continuous Infusions: . sodium chloride 125 mL/hr at 06/09/14 1400  . feeding supplement (VITAL AF 1.2 CAL) 20 mL/hr (06/09/14 1400)  . fentaNYL infusion INTRAVENOUS 250 mcg/hr (06/09/14 1505)  . phenylephrine (NEO-SYNEPHRINE) Adult infusion 35 mcg/min (06/09/14 1400)  . propofol 60 mcg/kg/min (06/09/14 1505)    Lloyd HugerSarah F Hernan Turnage MS RD LDN Clinical Dietitian [phone]

## 2014-06-10 ENCOUNTER — Inpatient Hospital Stay (HOSPITAL_COMMUNITY): Payer: BC Managed Care – PPO

## 2014-06-10 DIAGNOSIS — K7030 Alcoholic cirrhosis of liver without ascites: Secondary | ICD-10-CM

## 2014-06-10 DIAGNOSIS — J80 Acute respiratory distress syndrome: Secondary | ICD-10-CM

## 2014-06-10 DIAGNOSIS — J189 Pneumonia, unspecified organism: Principal | ICD-10-CM

## 2014-06-10 DIAGNOSIS — I369 Nonrheumatic tricuspid valve disorder, unspecified: Secondary | ICD-10-CM

## 2014-06-10 DIAGNOSIS — J9601 Acute respiratory failure with hypoxia: Secondary | ICD-10-CM

## 2014-06-10 LAB — BLOOD GAS, ARTERIAL
Acid-base deficit: 7.7 mmol/L — ABNORMAL HIGH (ref 0.0–2.0)
Bicarbonate: 16.6 meq/L — ABNORMAL LOW (ref 20.0–24.0)
Drawn by: 257701
FIO2: 0.7 %
MECHVT: 580 mL
O2 Saturation: 94.6 %
PEEP: 10 cmH2O
Patient temperature: 98.6
RATE: 24 {breaths}/min
TCO2: 15.9 mmol/L (ref 0–100)
pCO2 arterial: 30.8 mmHg — ABNORMAL LOW (ref 35.0–45.0)
pH, Arterial: 7.351 (ref 7.350–7.450)
pO2, Arterial: 71 mmHg — ABNORMAL LOW (ref 80.0–100.0)

## 2014-06-10 LAB — CBC
HCT: 26.8 % — ABNORMAL LOW (ref 39.0–52.0)
Hemoglobin: 8.2 g/dL — ABNORMAL LOW (ref 13.0–17.0)
MCH: 25.5 pg — ABNORMAL LOW (ref 26.0–34.0)
MCHC: 30.6 g/dL (ref 30.0–36.0)
MCV: 83.5 fL (ref 78.0–100.0)
PLATELETS: 212 10*3/uL (ref 150–400)
RBC: 3.21 MIL/uL — ABNORMAL LOW (ref 4.22–5.81)
RDW: 19.9 % — AB (ref 11.5–15.5)
WBC: 16.6 10*3/uL — ABNORMAL HIGH (ref 4.0–10.5)

## 2014-06-10 LAB — BASIC METABOLIC PANEL
ANION GAP: 13 (ref 5–15)
BUN: 7 mg/dL (ref 6–23)
CALCIUM: 6.9 mg/dL — AB (ref 8.4–10.5)
CO2: 19 meq/L (ref 19–32)
Chloride: 111 mEq/L (ref 96–112)
Creatinine, Ser: 0.82 mg/dL (ref 0.50–1.35)
GFR calc Af Amer: >90 mL/min (ref >90)
Glucose, Bld: 103 mg/dL — ABNORMAL HIGH (ref 70–99)
Potassium: 4 mEq/L (ref 3.7–5.3)
SODIUM: 143 meq/L (ref 137–147)

## 2014-06-10 LAB — GLUCOSE, CAPILLARY
GLUCOSE-CAPILLARY: 83 mg/dL (ref 70–99)
GLUCOSE-CAPILLARY: 117 mg/dL — AB (ref 70–99)
Glucose-Capillary: 76 mg/dL (ref 70–99)
Glucose-Capillary: 78 mg/dL (ref 70–99)
Glucose-Capillary: 110 mg/dL — ABNORMAL HIGH (ref 70–99)
Glucose-Capillary: 91 mg/dL (ref 70–99)
Glucose-Capillary: 109 mg/dL — ABNORMAL HIGH (ref 70–99)

## 2014-06-10 MED ORDER — MIDAZOLAM HCL 2 MG/2ML IJ SOLN
1.0000 mg | INTRAMUSCULAR | Status: DC | PRN
  Administered 2014-06-10 – 2014-06-11 (×12): 2 mg via INTRAVENOUS
  Filled 2014-06-10 (×13): qty 2

## 2014-06-10 MED ORDER — ACETAMINOPHEN 325 MG PO TABS
ORAL_TABLET | ORAL | Status: AC
Start: 2014-06-10 — End: 2014-06-10
  Administered 2014-06-10: 650 mg via OROGASTRIC
  Filled 2014-06-10: qty 2

## 2014-06-10 MED ORDER — ACETAMINOPHEN 325 MG PO TABS: 650.0000 mg | ORAL_TABLET | Freq: Four times a day (QID) | ORAL | Status: DC | PRN

## 2014-06-10 MED ORDER — MAGNESIUM SULFATE 4000MG/100ML IJ SOLN
4.0000 g | Freq: Once | INTRAMUSCULAR | Status: AC
  Administered 2014-06-10: 4 g via INTRAVENOUS
  Filled 2014-06-10: qty 100

## 2014-06-10 MED ORDER — POTASSIUM CHLORIDE 20 MEQ/15ML (10%) PO LIQD
40.0000 meq | ORAL | Status: AC
  Administered 2014-06-10 (×2): 40 meq
  Filled 2014-06-10 (×2): qty 30

## 2014-06-10 MED ORDER — CLONAZEPAM 1 MG PO TABS
2.0000 mg | ORAL_TABLET | Freq: Three times a day (TID) | ORAL | Status: DC
  Administered 2014-06-10 – 2014-06-11 (×3): 2 mg via ORAL
  Filled 2014-06-10 (×3): qty 2

## 2014-06-10 MED ORDER — FUROSEMIDE 10 MG/ML IJ SOLN
40.0000 mg | Freq: Once | INTRAMUSCULAR | Status: AC
  Administered 2014-06-10: 40 mg via INTRAVENOUS
  Filled 2014-06-10: qty 4

## 2014-06-10 MED ORDER — MIDAZOLAM HCL 2 MG/2ML IJ SOLN
INTRAMUSCULAR | Status: AC
  Administered 2014-06-10: 2 mg
  Filled 2014-06-10: qty 2

## 2014-06-10 NOTE — Progress Notes (Signed)
eLink Physician-Brief Progress Note Patient Name: Jerome BodyWilliam [name] DOB: 12/20/1975 MRN: [id number]   Date of Service  06/10/2014  HPI/Events of Note  Breakthrough agitn on high dose propofol & fent gtt Hypotensive on neogtt  eICU Interventions  Versed prn Consider adding benzos via tube for withdrawal     Intervention Category Major Interventions: Delirium, psychosis, severe agitation - evaluation and management  Briggett Tuccillo V. 06/10/2014, 12:16 AM

## 2014-06-10 NOTE — Progress Notes (Signed)
(  no charge)  Following at a distance.  Hgb stable.  Agitated status noted.  On standby in case of acute, destabilizing bleed.  Please call us if needed.  Florencia Reasonsobert V. Olinda Nola, M.D. [phone]

## 2014-06-10 NOTE — Progress Notes (Signed)
Per Dr. Craige CottaSood - initial ABG post ARDS- no change to MVe at this time.

## 2014-06-10 NOTE — Progress Notes (Signed)
PULMONARY / CRITICAL CARE MEDICINE  Name: Claris Guymon MRN: [id number] DOB:  [birth year] ADMISSION DATE: 06/05/2014  CONSULTATION DATE: 06/08/2014  REFERRING MD : TRH  CHIEF COMPLAINT: Respiratory Failure  INITIAL PRESENTATION:  38 yo male with hx of ETOH presented with dyspnea/fatigue from profound anemia (Hb 4.5), and received multiple transfusions of PRBC.  Developed progressive respiratory failure with Rt > Lt pulmonary infiltrates, and PCCM assumed care in ICU.  SIGNIFICANT EVENTS:  10/9: admitted; PRBC x3, GI consulted 10/10 PRBC x1 10/11: PRBC x1; new onset PNA on chest x ray 10/12: acute hypoxic respiratory failure overnight requiring BiPAP. Alcohol withdrawal requiring precedex gtt. Intubated for decreasing LOC and increasing 02 needs and worsening PNA on CXR.  10/13: patient increasing agitated on ventilator. Not tolerating less than 80% fio2. Bronch: thick secretions, friable airways but no sig plugging. Also did right thora. ~ 500 ml free flowing transudate.  10/14: requiring high FIO2. Stood up at bedside on 400 mcgs of fentanyl and 70 mcgs of diprivan! Does OK w/ PRN versed. No radiographic or clinical improvement.   STUDIES: 10/13 Rt thoracentesis > 500 ml fluid: transudate   SUBJECTIVE: Remains on increased PEEP, FiO2.  PHYSICAL EXAM: Vital signs in last 24 hours: Temp:  [98.8 F (37.1 C)-101.7 F (38.7 C)] 101.1 F (38.4 C) (10/14 0900) Pulse Rate:  [61-118] 87 (10/14 0900) Resp:  [18-22] 20 (10/14 0900) BP: (96-129)/(30-56) 112/43 mmHg (10/14 0900) SpO2:  [79 %-99 %] 93 % (10/14 0900) FiO2 (%):  [60 %-100 %] 100 % (10/14 0815) Weight:  [223 lb 5.2 oz (101.3 kg)] 223 lb 5.2 oz (101.3 kg) (10/14 0400) Last BM Date: 06/07/14 VENT SETTINGS: Vent Mode:  [-] PRVC FiO2 (%):  [60 %-100 %] 70 % Set Rate:  [20 bmp-24 bmp] 24 bmp Vt Set:  [580 mL] 580 mL PEEP:  [10 cmH20] 10 cmH20 Plateau Pressure:  [21 cmH20-250 cmH20] 250 cmH20 I/O: Intake/Output     10/13  0701 - 10/14 0700 10/14 0701 - 10/15 0700   I.V. (mL/kg) 5857.1 (57.8) 554.3 (5.5)   Other     NG/GT 697.2 100   IV Piggyback 1000 50   Total Intake(mL/kg) 7554.3 (74.6) 704.3 (7)   Urine (mL/kg/hr) 1925 (0.8) 75 (0.2)   Emesis/NG output 30 (0)    Total Output 1955 75   Net +5599.3 +629.3          PHYSICAL EXAMINATION:  General: no distress Neuro: Vented and sedated with RASS -3.  Follows commands with propofol stopped.  HEENT: ETT in place Cardiovascular: RRR.  Lungs: scattered rhonchi. Diminished on right.  Abdomen: Soft, non-tender + bowel sounds. No obvious ascites.  Musculoskeletal: No focal deficits.  Skin: warm and dry. No edema.  Lab Results: CBC Recent Labs     06/08/14  0745  06/09/14  0500  06/10/14  0520  WBC  11.4*  12.2*  16.6*  HGB  8.2*  8.0*  8.2*  HCT  26.7*  25.2*  26.8*  PLT  174  166  212   BMET Recent Labs     06/08/14  0745  06/09/14  0500  06/09/14  1048  06/10/14  0520  NA  141  142   --   143  K  3.3*  3.2*  3.5*  4.0  CL  108  109   --   111  CO2  20  21   --   19  BUN  8  8   --  7  CREATININE  0.71  0.66   --   0.82  GLUCOSE  108*  92   --   103*    Electrolytes Recent Labs     06/08/14  0745  06/08/14  0800  06/09/14  0500  06/09/14  1048  06/10/14  0520  CALCIUM  7.7*   --   7.1*   --   6.9*  MG   --   1.5   --   1.5   --    ABG Recent Labs     06/08/14  0007  06/08/14  0307  06/08/14  1215  PHART  7.422  7.412  7.323*  PCO2ART  30.9*  30.3*  41.3  PO2ART  BELOW REPORTABLE RANGE.  120.0*  85.2    Liver Enzymes Recent Labs     06/08/14  0745  06/09/14  0500  AST  74*  85*  ALT  14  15  ALKPHOS  147*  141*  BILITOT  3.4*  2.4*  ALBUMIN  2.6*  2.4*   Glucose Recent Labs     06/09/14  0807  06/09/14  1154  06/09/14  1527  06/09/14  1940  06/09/14  2349  06/10/14  0312  GLUCAP  92  86  86  76  78  83    Imaging Dg Chest Port 1 View  06/10/2014   CLINICAL DATA:  Pneumonia.  EXAM: PORTABLE  CHEST - 1 VIEW  COMPARISON:  06/09/2014, 06/08/2014, 06/08/2014 and 06/07/2014  FINDINGS: Endotracheal tube and central venous catheter and NG tube appear good position, unchanged. There is progressive consolidation at the left lung base with increasing left effusion and a persistent right effusion. Right perihilar infiltrate has progressed in the upper lobe.  IMPRESSION: Progressive infiltrates and effusions.   Electronically Signed   By: Geanie CooleyJim  Maxwell M.D.   On: 06/10/2014 07:54   Dg Chest Port 1 View  06/09/2014   CLINICAL DATA:  Post thoracentesis  EXAM: PORTABLE CHEST - 1 VIEW  COMPARISON:  06/09/2014  FINDINGS: Cardiomediastinal silhouette is stable. Stable endotracheal and NG tube position. Left IJ central line is stable in position. There is small right pleural effusion with right lower lobe atelectasis or infiltrate. No pneumothorax. No pulmonary edema.  IMPRESSION: Small right pleural effusion with right lower lobe atelectasis or infiltrate. No pneumothorax. Stable support apparatus.   Electronically Signed   By: Natasha MeadLiviu  Pop M.D.   On: 06/09/2014 10:54   Dg Chest Port 1 View  06/09/2014   CLINICAL DATA:  Pneumonia.  EXAM: PORTABLE CHEST - 1 VIEW  COMPARISON:  06/08/2014  FINDINGS: Endotracheal tube, NG tube, and central catheter appear in good position. There is a new large right pleural effusion. Right perihilar infiltrate persists with consolidation posterior medially of both lung bases. I suspect the patient has a small left effusion as well. There is distention of the azygos vein. Pulmonary vascularity is within normal limits.  IMPRESSION: New large right pleural effusion. Increased consolidation at the right lung base. The findings could represent pneumonia but the possibility atypical pulmonary edema should be considered.   Electronically Signed   By: Geanie CooleyJim  Maxwell M.D.   On: 06/09/2014 06:59   Dg Chest Port 1 View  06/08/2014   CLINICAL DATA:  Pneumonia. Evaluate recently placed support  apparatuses.  EXAM: PORTABLE CHEST - 1 VIEW  COMPARISON:  06/08/2014  FINDINGS: Endotracheal tube is 5.4 cm above the carina. Left jugular catheter has been placed. The  tip of the catheter is overlying the right mainstem bronchus and appears to course below the azygos vein. Location of this catheter tip is uncertain. This could be within the upper SVC but cannot exclude arterial placement. No evidence for a pneumothorax. Persistent airspace densities in the central aspect of the right lung. Increased densities and consolidation at the left lung base. Heart size is grossly stable.  IMPRESSION: Placement of left jugular central line as described. Catheter tip could be in the upper SVC but cannot exclude arterial placement. Findings were called to the patient's nurse at 1:30 p.m. on 06/08/2014.  Negative for a pneumothorax.  Endotracheal tube is well positioned.  Persistent airspace disease in the central aspect of the right lung is compatible with history of pneumonia.  Increased densities at the left lung base.   Electronically Signed   By: Richarda OverlieAdam  Henn M.D.   On: 06/08/2014 13:34    ASSESSMENT / PLAN:  PULMONARY  OETT 10/12 >> A:  Acute hypoxic respiratory failure PNA R pleural effusion (transudate) Bilateral consolidation; R>L Orange secretions from ETT (presumed aspiration) O2 requirements and CXR worse. P:  Transition to ARDS protocol Wean PEEP/FiO2 to keep SaO2 > 90% F/u CXR Add low dose lasix, aim for neg fluid balance   CARDIOVASCULAR  A:  Hypotension in setting of increased sedation and pain meds requiring vasopressors. P:  Phenylephrine for MAP >65 Replace mag and potassium as needed Hold home hypertensives Low dose lasix (even in setting of pressors) Check Echo  RENAL  A:  CKDIII Hypokalemia P:  Trend chemistry  Renal function WNL today, continue monitoring   GASTROINTESTINAL  A:  Severe protein calorie malnutrition  Cirrhosis of liver  Possible gastritis   Transaminitis (likely related to cirrhosis and alcohol consumption)  High GI residuals: suspect that this is due to his high narcotic requirements and evolving ileus  P:  ContinueTube feeds 10/13, per nutrition recommendations When tube feeds at goal, KVO fluids. Try to wean fentanyl   Continue protonix IV  HEMATOLOGIC  A:  Leukocytosis Anemia (slow bleeding portal gastropathy suspected); no overt bleeding at present P:  F/u CBC Transfuse for hgb <7.0   INFECTIOUS  A:  Right > left PNA. HCAP vs aspiration Flu negative P:  BCx2 >>>  Sputum >>> Right pleural fluid 10/13>>> Levofloxacin 10/11 >> 10/12  Vancomycin 10/12 >>  Cefepime 10/12 >>  Isolation   ENDOCRINE  A:  Hyperglycemia DMII with nephropathy P:  SSI ordered  NEUROLOGIC  A:  Alcohol withdrawal Acute encephalopathy  P:  Change to PAD protocol  RASS goal: -2 to -3  Add low dose clonazepam w/ goal to decrease fentanyl requirements.  Supportive care   thiamine and folate   Family updated:  Parents updated at bedside 10/14  Interdisciplinary meeting (due by day 10/19):   Anders SimmondsPete Babcock ACNP-BC [facility] Pul[facility]Critical [facility] Pager # [phone] OR # [phone] if no answer  Reviewed above, examined.  He has progressive pulmonary infiltrates ?developing ARDS with HCAP vs pulmonary edema.  Will continue Abx, and attempt diuresis.  Will monitor CVP and check Echo.  Still requiring very high doses of sedation >> this is likely contributing to his need for pressor agents.  I explained to the family that he will not likely recover quickly and that if he is not making significant progress in the next few days, then we might need to consider setting up more long term vent support.  CC time by me independent of APP time/procedure time is  40 minutes.  Coralyn Helling, MD [facility] Pulmonary/Critical Care 06/10/2014, 10:57 AM Pager:  [phone] After 3pm call: [phone]

## 2014-06-10 NOTE — Plan of Care (Signed)
Problem: Consults Goal: Nutrition Consult-if indicated Outcome: Progressing Vital 1.2 at 20 mL/hr started, goal 50 mL/hr

## 2014-06-10 NOTE — Progress Notes (Signed)
  Echocardiogram 2D Echocardiogram has been performed.  Jerome [name] [name] 06/10/2014, 3:21 PM

## 2014-06-11 ENCOUNTER — Inpatient Hospital Stay (HOSPITAL_COMMUNITY): Payer: BC Managed Care – PPO

## 2014-06-11 DIAGNOSIS — K567 Ileus, unspecified: Secondary | ICD-10-CM

## 2014-06-11 DIAGNOSIS — N189 Chronic kidney disease, unspecified: Secondary | ICD-10-CM

## 2014-06-11 DIAGNOSIS — J90 Pleural effusion, not elsewhere classified: Secondary | ICD-10-CM

## 2014-06-11 DIAGNOSIS — N183 Chronic kidney disease, stage 3 (moderate): Secondary | ICD-10-CM

## 2014-06-11 DIAGNOSIS — E1122 Type 2 diabetes mellitus with diabetic chronic kidney disease: Secondary | ICD-10-CM

## 2014-06-11 DIAGNOSIS — E43 Unspecified severe protein-calorie malnutrition: Secondary | ICD-10-CM

## 2014-06-11 LAB — BLOOD GAS, ARTERIAL
ACID-BASE DEFICIT: 8.8 mmol/L — AB (ref 0.0–2.0)
Bicarbonate: 15.2 mEq/L — ABNORMAL LOW (ref 20.0–24.0)
DRAWN BY: 308601
FIO2: 0.6 %
MECHVT: 580 mL
O2 Saturation: 93.4 %
PCO2 ART: 27.5 mmHg — AB (ref 35.0–45.0)
PEEP/CPAP: 10 cmH2O
PO2 ART: 69.5 mmHg — AB (ref 80.0–100.0)
Patient temperature: 98.6
RATE: 24 resp/min
TCO2: 14.6 mmol/L (ref 0–100)
pH, Arterial: 7.362 (ref 7.350–7.450)

## 2014-06-11 LAB — CULTURE, BAL-QUANTITATIVE W GRAM STAIN
Colony Count: NO GROWTH
Culture: NO GROWTH

## 2014-06-11 LAB — VANCOMYCIN, TROUGH: Vancomycin Tr: 8.6 ug/mL — ABNORMAL LOW (ref 10.0–20.0)

## 2014-06-11 LAB — GLUCOSE, CAPILLARY
GLUCOSE-CAPILLARY: 105 mg/dL — AB (ref 70–99)
Glucose-Capillary: 129 mg/dL — ABNORMAL HIGH (ref 70–99)
Glucose-Capillary: 123 mg/dL — ABNORMAL HIGH (ref 70–99)
Glucose-Capillary: 132 mg/dL — ABNORMAL HIGH (ref 70–99)
Glucose-Capillary: 109 mg/dL — ABNORMAL HIGH (ref 70–99)
Glucose-Capillary: 113 mg/dL — ABNORMAL HIGH (ref 70–99)

## 2014-06-11 LAB — COMPREHENSIVE METABOLIC PANEL
ALT: 15 U/L (ref 0–53)
AST: 78 U/L — ABNORMAL HIGH (ref 0–37)
Albumin: 2.1 g/dL — ABNORMAL LOW (ref 3.5–5.2)
Alkaline Phosphatase: 170 U/L — ABNORMAL HIGH (ref 39–117)
Anion gap: 16 — ABNORMAL HIGH (ref 5–15)
BUN: 9 mg/dL (ref 6–23)
CALCIUM: 7.2 mg/dL — AB (ref 8.4–10.5)
CO2: 13 mEq/L — ABNORMAL LOW (ref 19–32)
CREATININE: 1.09 mg/dL (ref 0.50–1.35)
Chloride: 111 mEq/L (ref 96–112)
GFR calc Af Amer: >90 mL/min (ref >90)
GFR, EST NON AFRICAN AMERICAN: 86 mL/min — AB (ref >90)
GLUCOSE: 113 mg/dL — AB (ref 70–99)
Potassium: 3.8 mEq/L (ref 3.7–5.3)
Sodium: 140 mEq/L (ref 137–147)
Total Bilirubin: 2.3 mg/dL — ABNORMAL HIGH (ref 0.3–1.2)
Total Protein: 6 g/dL (ref 6.0–8.3)

## 2014-06-11 LAB — CBC
HEMATOCRIT: 26.5 % — AB (ref 39.0–52.0)
HEMOGLOBIN: 8.1 g/dL — AB (ref 13.0–17.0)
MCH: 25.6 pg — AB (ref 26.0–34.0)
MCHC: 30.6 g/dL (ref 30.0–36.0)
MCV: 83.6 fL (ref 78.0–100.0)
Platelets: 178 10*3/uL (ref 150–400)
RBC: 3.17 MIL/uL — ABNORMAL LOW (ref 4.22–5.81)
RDW: 20.2 % — ABNORMAL HIGH (ref 11.5–15.5)
WBC: 12.1 10*3/uL — ABNORMAL HIGH (ref 4.0–10.5)

## 2014-06-11 LAB — TRIGLYCERIDES: TRIGLYCERIDES: 494 mg/dL — AB (ref <150)

## 2014-06-11 LAB — CULTURE, BAL-QUANTITATIVE

## 2014-06-11 MED ORDER — ALBUTEROL SULFATE (2.5 MG/3ML) 0.083% IN NEBU
2.5000 mg | INHALATION_SOLUTION | RESPIRATORY_TRACT | Status: DC | PRN
  Administered 2014-07-02 – 2014-07-05 (×2): 2.5 mg via RESPIRATORY_TRACT
  Filled 2014-06-11 (×2): qty 3

## 2014-06-11 MED ORDER — MIDAZOLAM HCL 2 MG/2ML IJ SOLN
1.0000 mg | INTRAMUSCULAR | Status: DC | PRN
  Administered 2014-06-12 (×2): 2 mg via INTRAVENOUS
  Administered 2014-06-17: 4 mg via INTRAVENOUS
  Administered 2014-06-18 – 2014-06-19 (×4): 2 mg via INTRAVENOUS
  Filled 2014-06-11: qty 2

## 2014-06-11 MED ORDER — VANCOMYCIN HCL 10 G IV SOLR
1750.0000 mg | Freq: Three times a day (TID) | INTRAVENOUS | Status: DC
  Administered 2014-06-12 (×2): 1750 mg via INTRAVENOUS
  Filled 2014-06-11 (×3): qty 1750

## 2014-06-11 MED ORDER — VANCOMYCIN HCL 500 MG IV SOLR
500.0000 mg | Freq: Once | INTRAVENOUS | Status: AC
  Administered 2014-06-11: 500 mg via INTRAVENOUS
  Filled 2014-06-11: qty 500

## 2014-06-11 MED ORDER — FENTANYL CITRATE 0.05 MG/ML IJ SOLN
50.0000 ug | INTRAMUSCULAR | Status: DC | PRN
Start: 2014-06-11 — End: 2014-06-12
  Administered 2014-06-11: 50 ug via INTRAVENOUS
  Administered 2014-06-11 – 2014-06-12 (×17): 100 ug via INTRAVENOUS
  Filled 2014-06-11 (×18): qty 2

## 2014-06-11 NOTE — Progress Notes (Addendum)
NUTRITION FOLLOW UP  Intervention:   -Hold TF for 24, consider restarting with conservative advancement on 10/16.  -As tolerated:  -Recommend Vital AF 1.2 at 20 ml/hr and increase 10 ml every 6 hours to goal rate of 50 ml/hr with Prostat 30 ml once daily   -Regimen provides 1540 kcal, 105 gm protein and 973 ml free water daily  -TF + propofol =  2512 kcal (100% est kcal needs), 105 gram protein (100% est protein needs) -RD to follow; will monitor propofol and adjust rate/supplement as needed  Nutrition Dx:   Inadequate oral intake related to inability to eat as evidenced by npo status; ongoing   Goal:   Diet advancement. Patient to meet estimated needs with meals and supplements.   New Goal: TF to meet >/= 90% of their estimated nutrition needs    Monitor:   TF tolerance, total protein/energy intake, labs, weights, diet order, sedation  Assessment:   10/12:  NPO on c pap. Currently being intubated.  Ate 1/2 omelet Saturday. Mother reports that patient has been afraid of eating for some time for fear of vomiting.  Patient last seen by RD 03/20/14. Diagnosed with severe malnutrition at that time due to weight loss and poor intake.  Weight has increased from 190 lbs in July to 208 lbs currently. Probably fluid gain.  Intake has been poor prior to admit.  Patient is currently intubated on ventilator support  MV: 11.6 L/min  Temp (24hrs), Avg:99 F (37.2 C), Min:98.3 F (36.8 C), Max:99.7 F (37.6 C)   Propofol: 20 ml/hr   10/13: Patient remains intubated on ventilator support MV: 11.2 L/min Temp (24hrs), Avg:98.3 F (36.8 C), Min:97.7 F (36.5 C), Max:99.9 F (37.7 C)  Propofol: 36.8 ml/hr  Vital AF 1.2 initiated at 20 ml/hr on 10/13 with goal rate of 60 ml/hr. RD to order adult enteral protocol.  MD noted pt increasingly agitated on ventilator. Propofol level increased, providing approximately 972 kcal/day. Low K levels being repleted. Weight increased 8 lbs since  admit, likely d/t fluid Pt s/p thoracentesis with 500 ml fluid removal  10/15: -Pt's tube feeding on hold d/t elevated gastric residuals.  -Vital AF 1.2 had been running at 25 ml/hr. On 10/14, residuals > 500. OGT clamped. Residuals 350 ml on 10/15, bile looking appearance per RN -Per discussion with RN, pt's tube feed to be held for 24 hours before restarting. Gastric residuals likely related to sedation, and bowel function will likely improve with sedation weaning. RN noted only minimal bowel sounds -Propofol was decreased earlier this morning to 19.8 ml/hr for WUA; and then was increased back to 36.8 ml/hr  -MD recommended f/u abd xray -Continue with current TF recommendation to be reinitiated per MD discretion, will monitor propofol levels/possible weaning and adjust as warranted -Total bili elevated, likely d/t cirrhosis of liver and hx of ETOH -Weight increased from 40 lb from admit, likely d/t fluid as pt with +2 generalized edema and +5 L fluid balance  Height: Ht Readings from Last 1 Encounters:  06/10/14 5\' 10"  (1.778 m)    Weight Status:   Wt Readings from Last 1 Encounters:  06/11/14 247 lb 2.2 oz (112.1 kg)  06/05/14 208 lb  Re-estimated needs:  Kcal: 2310  Protein: 95-105  Fluid: 2.4L  Skin: WDL  Diet Order: NPO   Intake/Output Summary (Last 24 hours) at 06/11/14 1140 Last data filed at 06/11/14 1100  Gross per 24 hour  Intake 7010.14 ml  Output   1575 ml  Net 5435.14 ml    Last BM: 10/11   Labs:   Recent Labs Lab 06/08/14 0745 06/08/14 0800 06/09/14 0500 06/09/14 1048 06/10/14 0520 06/11/14 0345  NA 141  --  142  --  143 140  K 3.3*  --  3.2* 3.5* 4.0 3.8  CL 108  --  109  --  111 111  CO2 20  --  21  --  19 13*  BUN 8  --  8  --  7 9  CREATININE 0.71  --  0.66  --  0.82 1.09  CALCIUM 7.7*  --  7.1*  --  6.9* 7.2*  MG  --  1.5  --  1.5  --   --   GLUCOSE 108*  --  92  --  103* 113*    CBG (last 3)   Recent Labs  06/11/14 0016  06/11/14 0324 06/11/14 0807  GLUCAP 129* 123* 105*    Scheduled Meds: . antiseptic oral rinse  7 mL Mouth Rinse QID  . ceFEPime (MAXIPIME) IV  1 g Intravenous Q8H  . chlorhexidine  15 mL Mouth Rinse BID  . folic acid  1 mg Intravenous Daily  . insulin aspart  0-9 Units Subcutaneous 6 times per day  . pantoprazole (PROTONIX) IV  40 mg Intravenous Q12H  . sodium chloride  3 mL Intravenous Q12H  . thiamine IV  100 mg Intravenous Daily  . vancomycin  1,250 mg Intravenous Q8H    Continuous Infusions: . sodium chloride 50 mL/hr at 06/11/14 0759  . phenylephrine (NEO-SYNEPHRINE) Adult infusion 50 mcg/min (06/11/14 1100)  . propofol 65 mcg/kg/min (06/11/14 1100)    Lloyd HugerSarah F Ahlani Wickes MS RD LDN Clinical Dietitian Pager:[phone]

## 2014-06-11 NOTE — Plan of Care (Signed)
Problem: Phase I Progression Outcomes Goal: Patient tolerating nututrition at goal Outcome: Not Progressing Tube feedings discontinued due to high residuals.

## 2014-06-11 NOTE — Progress Notes (Signed)
Ventilator changed out due to PM from [employer] needing to be done.

## 2014-06-11 NOTE — Progress Notes (Signed)
Despite coming in with a very low hemoglobin, and having a known history of portal hypertension, the patient has been without evidence of clinically significant bleeding since admission. We will sign off at this time. Please call if we can be of further assistance with this patient.  Florencia Reasonsobert V. Vertis Scheib, M.D. [phone]

## 2014-06-11 NOTE — Progress Notes (Signed)
PHARMACY BRIEF NOTE  Assessment: Subtherapeutic Vancomycin trough in patient with pneumonia and goal trough of 15-3820mcg/ml.   10/15 1710 VT = 8.6 on Vancomycin 1250mg  IV q8h   Goal of Therapy:  Vancomycin trough level 15-20 mcg/ml   Plan:  Increase Vancomycin to 1750mg  IV Q8H Measure Vanc trough at steady state  Follow up renal fxn  Jerome [name], PharmD  Pager: [phone]  06/11/2014  3:44 PM

## 2014-06-11 NOTE — Progress Notes (Signed)
ANTIBIOTIC CONSULT NOTE - Follow Up  Pharmacy Consult for Vancomycin, Cefepime Indication: Sepsis  No Known Allergies  Patient Measurements: Height: 5\' 10"  (177.8 cm) Weight: 247 lb 2.2 oz (112.1 kg) IBW/kg (Calculated) : 73  Vital Signs: Temp: 98.2 F (36.8 C) (10/15 1030) Temp Source: Core (Comment) (10/15 0730) BP: 105/38 mmHg (10/15 1030) Pulse Rate: 69 (10/15 1030) Intake/Output from previous day: 10/14 0701 - 10/15 0700 In: 7606.3 [I.V.:6186.7; NG/GT:669.6; IV Piggyback:750] Out: 1375 [Urine:1375] Intake/Output from this shift: Total I/O In: 976.9 [I.V.:426.9; IV Piggyback:550] Out: 300 [Urine:300]  Labs:  Recent Labs  06/09/14 0500 06/10/14 0520 06/11/14 0345  WBC 12.2* 16.6* 12.1*  HGB 8.0* 8.2* 8.1*  PLT 166 212 178  CREATININE 0.66 0.82 1.09   Estimated Creatinine Clearance: 117.4 ml/min (by C-G formula based on Cr of 1.09).  Recent Labs  06/09/14 1700  VANCOTROUGH <5.0*     Assessment: 38 yoM admitted 10/9 after being called by PCP that Hgb was 4. Pt admitted for GIB. Pt with Hx EtOH abuse, cirrhosis, bleeding esophageal varices, DM2, and CKD. Levaquin started for pneumonia since CXR showed R perihilar and lower lobe patchy airspace suspicious for pneumonia and patient was experiencing low-grade fever and leukocytosis.  Last night, PO2 was less than reportable range and patient was transferred to ICU-SD.  CXR this am with significant increase in airspace opacities bilaterally.  Pharmacy consulted to broaden antibiotics to vancomycin and cefepime.  Antiinfectives  10/11 >> levofloxacin x 1 10/12 >> vancomycin >> 10/12 >> cefepime >>  Labs / vitals Tmax: 101.5 WBCs: elevated Renal: SCr 1.09 (increased), CrCl >100 ml/min CG, ~94 ml/min (normalized)  Microbiology 10/9 MRSA PCR: positive 10/12 influenza panel: neg 10/13 BAL cx: NGF 10/13 thoracentesis pleural cx: ngtd, nothing seen on gram stain 10/14 trach aspirate: oral flora  Today is day #4  Vancomycin 1250mg  q8h and Cefepime 1g q8h for sepsis 2/2 pna (HCAP vs aspiration). Pt s/p bronch and thoracentesis 10/13.  Cultures no growth to date.   Goal of Therapy:  Vancomycin trough 15-20 mcg/mL Doses adjusted per renal function Eradication of infection  Plan:  1.  Obtain vancomycin trough this evening. 2.  Continue Cefepime 1g IV q8h. 3.  F/u culture results. 4.  If concerned for aspiration, would consider adjusting antibiotics for better anaerobic coverage. Could add Clindamycin or switch Cefepime to Zosyn.  Jerome [name], PharmD, BCPS Pager: [phone] 06/11/2014 10:44 AM

## 2014-06-11 NOTE — Progress Notes (Signed)
PULMONARY / CRITICAL CARE MEDICINE  Name: Jerome [name] MRN: [id number] DOB:  10/28/1975 ADMISSION DATE: 06/05/2014  CONSULTATION DATE: 06/08/2014  REFERRING MD : TRH  CHIEF COMPLAINT: Respiratory Failure  INITIAL PRESENTATION:  38 yo male with hx of ETOH presented with dyspnea/fatigue from profound anemia (Hb 4.5), and received multiple transfusions of PRBC.  Developed progressive respiratory failure with Rt > Lt pulmonary infiltrates, and PCCM assumed care in ICU.  SIGNIFICANT EVENTS:  10/09 admitted; PRBC x3, GI consulted 10/10 PRBC x1 10/11 PRBC x1; new onset PNA on chest x ray 10/12: acute hypoxic respiratory failure overnight requiring BiPAP. Alcohol withdrawal requiring precedex gtt. Intubated for decreasing LOC and increasing 02 needs and worsening PNA on CXR.  10/13 bronchoscopy >> respiratory secretions from RLL, friable airways 10/14 gastric residuals  STUDIES: 10/13 Rt thoracentesis > 500 ml fluid: transudate  10/14 Echo >> EF 55 to 60%, PAS 33 mmHg  SUBJECTIVE: Increased gastric residuals.  Remains on pressors.  Not much urine outpt with lasix yesterday.  Remains on increased PEEP/FiO2.  PHYSICAL EXAM: Vital signs in last 24 hours: Temp:  [97.7 F (36.5 C)-101.5 F (38.6 C)] 98.1 F (36.7 C) (10/15 0600) Pulse Rate:  [65-118] 67 (10/15 0600) Resp:  [15-24] 24 (10/15 0600) BP: (96-114)/(30-46) 105/39 mmHg (10/15 0600) SpO2:  [79 %-97 %] 93 % (10/15 0600) FiO2 (%):  [60 %-100 %] 60 % (10/15 0600) Weight:  [247 lb 2.2 oz (112.1 kg)] 247 lb 2.2 oz (112.1 kg) (10/15 0500) Last BM Date: 06/07/14 VENT SETTINGS: Vent Mode:  [-] PRVC FiO2 (%):  [60 %-100 %] 60 % Set Rate:  [20 bmp-24 bmp] 24 bmp Vt Set:  [580 mL] 580 mL PEEP:  [10 cmH20] 10 cmH20 Plateau Pressure:  [22 cmH20-250 cmH20] 24 cmH20 I/O: Intake/Output     10/14 0701 - 10/15 0700 10/15 0701 - 10/16 0700   I.V. (mL/kg) 5944.9 (53)    NG/GT 669.6    IV Piggyback 750    Total Intake(mL/kg) 7364.5  (65.7)    Urine (mL/kg/hr) 1375 (0.5)    Emesis/NG output     Total Output 1375     Net +5989.5            PHYSICAL EXAMINATION:  General: no distress Neuro: RASS -3 HEENT: ETT in place Cardiovascular: regular Lungs: bronchial breath sounds on Rt, diminished breath sounds on Lt Abdomen: Soft, non-tender, diminished bowel sounds Musculoskeletal: 1+ edema Skin: no rashes  Lab Results: CBC Recent Labs     06/09/14  0500  06/10/14  0520  06/11/14  0345  WBC  12.2*  16.6*  12.1*  HGB  8.0*  8.2*  8.1*  HCT  25.2*  26.8*  26.5*  PLT  166  212  178   BMET Recent Labs     06/09/14  0500  06/09/14  1048  06/10/14  0520  06/11/14  0345  NA  142   --   143  140  K  3.2*  3.5*  4.0  3.8  CL  109   --   111  111  CO2  21   --   19  13*  BUN  8   --   7  9  CREATININE  0.66   --   0.82  1.09  GLUCOSE  92   --   103*  113*    Electrolytes Recent Labs     06/08/14  0745  06/08/14  0800  06/09/14  0500  06/09/14  1048  06/10/14  0520  06/11/14  0345  CALCIUM  7.7*   --   7.1*   --   6.9*  7.2*  MG   --   1.5   --   1.5   --    --    ABG Recent Labs     06/08/14  1215  06/10/14  1103  06/11/14  0505  PHART  7.323*  7.351  7.362  PCO2ART  41.3  30.8*  27.5*  PO2ART  85.2  71.0*  69.5*    Liver Enzymes Recent Labs     06/08/14  0745  06/09/14  0500  06/11/14  0345  AST  74*  85*  78*  ALT  14  15  15   ALKPHOS  147*  141*  170*  BILITOT  3.4*  2.4*  2.3*  ALBUMIN  2.6*  2.4*  2.1*   Glucose Recent Labs     06/10/14  0818  06/10/14  1151  06/10/14  1602  06/10/14  1931  06/11/14  0016  06/11/14  0324  GLUCAP  91  110*  109*  117*  129*  123*    Imaging Dg Chest Port 1 View  06/10/2014   CLINICAL DATA:  Pneumonia.  EXAM: PORTABLE CHEST - 1 VIEW  COMPARISON:  06/09/2014, 06/08/2014, 06/08/2014 and 06/07/2014  FINDINGS: Endotracheal tube and central venous catheter and NG tube appear good position, unchanged. There is progressive consolidation at  the left lung base with increasing left effusion and a persistent right effusion. Right perihilar infiltrate has progressed in the upper lobe.  IMPRESSION: Progressive infiltrates and effusions.   Electronically Signed   By: Geanie CooleyJim  Maxwell M.D.   On: 06/10/2014 07:54   Dg Chest Port 1 View  06/09/2014   CLINICAL DATA:  Post thoracentesis  EXAM: PORTABLE CHEST - 1 VIEW  COMPARISON:  06/09/2014  FINDINGS: Cardiomediastinal silhouette is stable. Stable endotracheal and NG tube position. Left IJ central line is stable in position. There is small right pleural effusion with right lower lobe atelectasis or infiltrate. No pneumothorax. No pulmonary edema.  IMPRESSION: Small right pleural effusion with right lower lobe atelectasis or infiltrate. No pneumothorax. Stable support apparatus.   Electronically Signed   By: Natasha MeadLiviu  Pop M.D.   On: 06/09/2014 10:54    ASSESSMENT / PLAN:  PULMONARY  OETT 10/12 >> A:  Acute hypoxic respiratory failure 2nd to PNA, and b/l pleural effusions (transudate on Rt). P:  Wean PEEP/FiO2 per ARDS protocol F/u CXR Full vent support for now  CARDIOVASCULAR  A:  Hypotension 2nd to sedation meds. P:  Wean pressors to keep SBP > 90, MAP > 65  RENAL  A:  Stage III CKD. Non gap metabolic acidosis. P:  Monitor renal fx, urine outpt F/u ABG, BMET   GASTROINTESTINAL  A:  Severe protein calorie malnutrition. Hx of ETOH with cirrhosis. Ileus >> relate to opiate meds. 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 P:  F/u CBC Transfuse for hgb <7.0   INFECTIOUS  A:  Right > left PNA. HCAP vs aspiration.  Flu negative. MRSA swab positive. P:  Day 5 of Abx, currently on vancomycin, cefepime  RLL BAL 10/13 >> Right pleural fluid 10/13 >> Sputum 10/14 >>  ENDOCRINE  A:  DMII with nephropathy. P:  SSI  NEUROLOGIC  A:  Acute encephalopathy 2nd to delirium tremens,  respiratory failure. P:  RASS goal -2 Continue diprivan with prn versed Try to limit fentanyl due to concern for ileus Continue thiamine, folic acid Foot drop boots  Family updated:  Parents updated at bedside 10/14  Interdisciplinary meeting (due by day 10/19):  CC time 40 minutes.  Coralyn Helling, MD [facility] Pulmonary/Critical Care 06/11/2014, 7:43 AM Pager:  [phone] After 3pm call: [phone]

## 2014-06-12 ENCOUNTER — Inpatient Hospital Stay (HOSPITAL_COMMUNITY): Payer: BC Managed Care – PPO

## 2014-06-12 LAB — GLUCOSE, CAPILLARY
GLUCOSE-CAPILLARY: 118 mg/dL — AB (ref 70–99)
GLUCOSE-CAPILLARY: 123 mg/dL — AB (ref 70–99)
Glucose-Capillary: 121 mg/dL — ABNORMAL HIGH (ref 70–99)

## 2014-06-12 LAB — BLOOD GAS, ARTERIAL
Acid-base deficit: 9.1 mmol/L — ABNORMAL HIGH (ref 0.0–2.0)
BICARBONATE: 14.8 meq/L — AB (ref 20.0–24.0)
Drawn by: 308601
FIO2: 0.5 %
MECHVT: 580 mL
O2 SAT: 93.8 %
PEEP: 8 cmH2O
Patient temperature: 98.6
RATE: 24 resp/min
TCO2: 14.1 mmol/L (ref 0–100)
pCO2 arterial: 26.1 mmHg — ABNORMAL LOW (ref 35.0–45.0)
pH, Arterial: 7.372 (ref 7.350–7.450)
pO2, Arterial: 70 mmHg — ABNORMAL LOW (ref 80.0–100.0)

## 2014-06-12 LAB — CBC
HCT: 27.3 % — ABNORMAL LOW (ref 39.0–52.0)
HEMOGLOBIN: 8.6 g/dL — AB (ref 13.0–17.0)
MCH: 25.7 pg — ABNORMAL LOW (ref 26.0–34.0)
MCHC: 31.5 g/dL (ref 30.0–36.0)
MCV: 81.7 fL (ref 78.0–100.0)
PLATELETS: 202 10*3/uL (ref 150–400)
RBC: 3.34 MIL/uL — AB (ref 4.22–5.81)
RDW: 20.8 % — ABNORMAL HIGH (ref 11.5–15.5)
WBC: 10.4 10*3/uL (ref 4.0–10.5)

## 2014-06-12 LAB — COMPREHENSIVE METABOLIC PANEL
ALBUMIN: 2.1 g/dL — AB (ref 3.5–5.2)
ALT: 16 U/L (ref 0–53)
ANION GAP: 15 (ref 5–15)
AST: 83 U/L — ABNORMAL HIGH (ref 0–37)
Alkaline Phosphatase: 170 U/L — ABNORMAL HIGH (ref 39–117)
BILIRUBIN TOTAL: 2 mg/dL — AB (ref 0.3–1.2)
BUN: 9 mg/dL (ref 6–23)
CHLORIDE: 112 meq/L (ref 96–112)
CO2: 13 meq/L — AB (ref 19–32)
CREATININE: 1.14 mg/dL (ref 0.50–1.35)
Calcium: 7.6 mg/dL — ABNORMAL LOW (ref 8.4–10.5)
GFR calc Af Amer: >90 mL/min (ref >90)
GFR, EST NON AFRICAN AMERICAN: 81 mL/min — AB (ref >90)
Glucose, Bld: 119 mg/dL — ABNORMAL HIGH (ref 70–99)
POTASSIUM: 4.4 meq/L (ref 3.7–5.3)
Sodium: 140 mEq/L (ref 137–147)
Total Protein: 6.3 g/dL (ref 6.0–8.3)

## 2014-06-12 LAB — BODY FLUID CULTURE
CULTURE: NO GROWTH
GRAM STAIN: NONE SEEN

## 2014-06-12 LAB — MAGNESIUM: MAGNESIUM: 2.1 mg/dL (ref 1.5–2.5)

## 2014-06-12 LAB — CULTURE, RESPIRATORY W GRAM STAIN

## 2014-06-12 LAB — CULTURE, RESPIRATORY

## 2014-06-12 LAB — PHOSPHORUS: Phosphorus: 2.1 mg/dL — ABNORMAL LOW (ref 2.3–4.6)

## 2014-06-12 MED ORDER — SODIUM CHLORIDE 0.9 % IV SOLN
0.0000 ug/h | INTRAVENOUS | Status: DC
  Administered 2014-06-13: 150 ug/h via INTRAVENOUS
  Administered 2014-06-13: 75 ug/h via INTRAVENOUS
  Administered 2014-06-13: 150 ug/h via INTRAVENOUS
  Administered 2014-06-13 – 2014-06-14 (×3): 100 ug/h via INTRAVENOUS
  Administered 2014-06-14 (×2): 175 ug/h via INTRAVENOUS
  Administered 2014-06-15: 200 ug/h via INTRAVENOUS
  Administered 2014-06-15: 250 ug/h via INTRAVENOUS
  Administered 2014-06-16 (×2): 350 ug/h via INTRAVENOUS
  Administered 2014-06-16: 300 ug/h via INTRAVENOUS
  Administered 2014-06-17 (×2): 400 ug/h via INTRAVENOUS
  Administered 2014-06-17: 200 ug/h via INTRAVENOUS
  Administered 2014-06-17: 400 ug/h via INTRAVENOUS
  Administered 2014-06-18 (×2): 300 ug/h via INTRAVENOUS
  Administered 2014-06-18: 250 ug/h via INTRAVENOUS
  Filled 2014-06-12 (×19): qty 50

## 2014-06-12 MED ORDER — SODIUM CHLORIDE 0.9 % IV SOLN
0.0000 mg/h | INTRAVENOUS | Status: DC
  Administered 2014-06-12 (×2): 10 mg/h via INTRAVENOUS
  Administered 2014-06-12 (×2): 2 mg/h via INTRAVENOUS
  Administered 2014-06-13 (×3): 6 mg/h via INTRAVENOUS
  Administered 2014-06-13: 10 mg/h via INTRAVENOUS
  Administered 2014-06-14: 5 mg/h via INTRAVENOUS
  Administered 2014-06-14: 1 mg/h via INTRAVENOUS
  Administered 2014-06-14: 6 mg/h via INTRAVENOUS
  Administered 2014-06-15: 7 mg/h via INTRAVENOUS
  Administered 2014-06-15: 2 mg/h via INTRAVENOUS
  Administered 2014-06-16: 3 mg/h via INTRAVENOUS
  Administered 2014-06-16: 4 mg/h via INTRAVENOUS
  Administered 2014-06-17: 10 mg/h via INTRAVENOUS
  Administered 2014-06-17: 8 mg/h via INTRAVENOUS
  Administered 2014-06-17: 5 mg/h via INTRAVENOUS
  Administered 2014-06-17: 4 mg/h via INTRAVENOUS
  Administered 2014-06-17: 8 mg/h via INTRAVENOUS
  Administered 2014-06-18 – 2014-06-20 (×5): 5 mg/h via INTRAVENOUS
  Administered 2014-06-21: 10 mg/h via INTRAVENOUS
  Administered 2014-06-21: 7 mg/h via INTRAVENOUS
  Administered 2014-06-21: 6 mg/h via INTRAVENOUS
  Administered 2014-06-21 – 2014-06-22 (×2): 10 mg/h via INTRAVENOUS
  Filled 2014-06-12 (×33): qty 10

## 2014-06-12 MED ORDER — FENTANYL BOLUS VIA INFUSION
50.0000 ug | INTRAVENOUS | Status: DC | PRN
  Administered 2014-06-13 (×2): 75 ug via INTRAVENOUS
  Administered 2014-06-13 (×2): 100 ug via INTRAVENOUS
  Administered 2014-06-13: 75 ug via INTRAVENOUS
  Administered 2014-06-13: 100 ug via INTRAVENOUS
  Administered 2014-06-14: 50 ug via INTRAVENOUS
  Administered 2014-06-14: 100 ug via INTRAVENOUS
  Administered 2014-06-14 (×2): 50 ug via INTRAVENOUS
  Administered 2014-06-14: 75 ug via INTRAVENOUS
  Administered 2014-06-14 – 2014-06-18 (×5): 100 ug via INTRAVENOUS
  Administered 2014-06-18 (×3): 50 ug via INTRAVENOUS
  Administered 2014-06-19: 100 ug via INTRAVENOUS
  Filled 2014-06-12: qty 100

## 2014-06-12 MED ORDER — VITAL AF 1.2 CAL PO LIQD
1000.0000 mL | ORAL | Status: DC
  Administered 2014-06-12: 1000 mL
  Filled 2014-06-12: qty 1000

## 2014-06-12 MED ORDER — OXEPA PO LIQD
1000.0000 mL | ORAL | Status: DC
  Administered 2014-06-12 – 2014-06-15 (×4): 1000 mL
  Filled 2014-06-12 (×5): qty 1000

## 2014-06-12 MED ORDER — FUROSEMIDE 10 MG/ML IJ SOLN
40.0000 mg | Freq: Two times a day (BID) | INTRAMUSCULAR | Status: AC
  Administered 2014-06-12 – 2014-06-13 (×2): 40 mg via INTRAVENOUS
  Filled 2014-06-12 (×2): qty 4

## 2014-06-12 MED ORDER — MIDAZOLAM BOLUS VIA INFUSION: 1.0000 mg | INTRAVENOUS | Status: DC | PRN

## 2014-06-12 NOTE — ED Provider Notes (Signed)
Medical screening examination/treatment/procedure(s) were conducted as a shared visit with non-physician practitioner(s) and myself.  I personally evaluated the patient during the encounter.   EKG Interpretation None      Pt seen and evaluated. Discussed with Rhea BleacherJosh Geiple PA.   History of known esophageal varices. Patient's hemoglobin 4. Confirmed. Guaiac positive stool. No frank hematemesis or bright red blood. Discussed with [facility] and GI. Will be admitted. Blood ordered in the emergency room. He is awake alert. Asymptomatic at rest. Lightheaded and weak with standing.  Rolland PorterMark Cloria Ciresi, MD 06/12/14 [phone]

## 2014-06-12 NOTE — Progress Notes (Signed)
eLink Physician-Brief Progress Note Patient Name: Jerome BodyWilliam [name] DOB: 05/11/1976 MRN: [id number]   Date of Service  06/12/2014  HPI/Events of Note  Continued issues with agitation on vent.  Not adequately sedated with versed with prn fentanyl.    eICU Interventions  Plan: Will start fentanyl gtt in addition to versed gtt.  Continue to monitor patient.     Intervention Category Major Interventions: Delirium, psychosis, severe agitation - evaluation and management  DETERDING,ELIZABETH 06/12/2014, 11:40 PM

## 2014-06-12 NOTE — Progress Notes (Signed)
While suctioning patient did received little Jerome [name] secretions along with 2 dark brown thick mucus plugs. Pt tolerated well. RN and family at bedside.

## 2014-06-12 NOTE — Progress Notes (Signed)
ANTIBIOTIC CONSULT NOTE - Follow Up  Pharmacy Consult for Vancomycin, Cefepime Indication: Sepsis  No Known Allergies  Patient Measurements: Height: 5\' 10"  (177.8 cm) Weight: 243 lb 13.3 oz (110.6 kg) IBW/kg (Calculated) : 73  Vital Signs: Temp: 98.6 F (37 C) (10/16 0800) Temp Source: Core (Comment) (10/16 0409) BP: 111/66 mmHg (10/16 0800) Pulse Rate: 87 (10/16 0800) Intake/Output from previous day: 10/15 0701 - 10/16 0700 In: 4512.5 [I.V.:3062.5; IV Piggyback:1450] Out: 3555 [Urine:3555] Intake/Output from this shift: Total I/O In: -  Out: 310 [Urine:310]  Labs:  Recent Labs  06/10/14 0520 06/11/14 0345 06/12/14 0545  WBC 16.6* 12.1* 10.4  HGB 8.2* 8.1* 8.6*  PLT 212 178 202  CREATININE 0.82 1.09 1.14   Estimated Creatinine Clearance: 111.5 ml/min (by C-G formula based on Cr of 1.14).  Recent Labs  06/09/14 1700 06/11/14 1710  VANCOTROUGH <5.0* 8.6*     Assessment: 38 yoM admitted 10/9 after being called by PCP that Hgb was 4. Pt admitted for GIB. Pt with Hx EtOH abuse, cirrhosis, bleeding esophageal varices, DM2, and CKD. Levaquin started for pneumonia since CXR showed R perihilar and lower lobe patchy airspace suspicious for pneumonia and patient was experiencing low-grade fever and leukocytosis.  Last night, PO2 was less than reportable range and patient was transferred to ICU-SD.  CXR this am with significant increase in airspace opacities bilaterally.  Pharmacy consulted to broaden antibiotics to vancomycin and cefepime.  Antiinfectives  10/11 >> levofloxacin x 1 10/12 >> vancomycin >> 10/12 >> cefepime >>  Labs / vitals Tmax: AF WBCs: improved to WNL Renal: SCr 1.14 (increased), CrCl >100 ml/min CG, ~90 ml/min (normalized)  Drug level / dose changes info: 10/13 1700 VT: <5 on 1g q8h, increased to 1250 mg q8h 10/15 1700 VT: 8.6 on 1250mg  q8h, increased to 1750mg  q8h  Microbiology 10/9 MRSA PCR: positive 10/12 influenza panel: neg 10/13 BAL  cx: NGF 10/13 thoracentesis pleural cx: NGF 10/14 trach aspirate: oral flora  Today is day #5 Vancomycin 1750mg  q8h and Cefepime 1g q8h for sepsis 2/2 pna (HCAP vs aspiration). Pt s/p bronch and thoracentesis 10/13.  Cultures no growth to date.  Vancomycin dose adjusted last night due to subtherapeutic trough.    Goal of Therapy:  Vancomycin trough 15-20 mcg/mL Doses adjusted per renal function Eradication of infection  Plan:  1.  Obtain vancomycin trough tomorrow AM.  Will check VT frequently on this high dose to check for accumulation. 2.  Continue Cefepime 1g IV q8h. 3.  If concerned for aspiration, would consider adjusting antibiotics for better anaerobic coverage. Could add Clindamycin or switch Cefepime to Zosyn. 4.  F/u SCr.  Clance BollAmanda Savier Trickett, PharmD, BCPS Pager: [phone] 06/12/2014 8:41 AM

## 2014-06-12 NOTE — Progress Notes (Signed)
PULMONARY / CRITICAL CARE MEDICINE  Name: Jerome [name] MRN: [id number] DOB:  10/28/1975 ADMISSION DATE: 06/05/2014  CONSULTATION DATE: 06/08/2014  REFERRING MD : TRH  CHIEF COMPLAINT: Respiratory Failure  INITIAL PRESENTATION:  38 yo male with hx of ETOH presented with dyspnea/fatigue from profound anemia (Hb 4.5), and received multiple transfusions of PRBC.  Developed progressive respiratory failure with Rt > Lt pulmonary infiltrates, and PCCM assumed care in ICU.  SIGNIFICANT EVENTS:  10/09 admitted; PRBC x3, GI consulted 10/10 PRBC x1 10/11 PRBC x1; new onset PNA on chest x ray 10/12: acute hypoxic respiratory failure overnight requiring BiPAP. Alcohol withdrawal requiring precedex gtt. Intubated for decreasing LOC and increasing 02 needs and worsening PNA on CXR.  10/13 bronchoscopy >> respiratory secretions from RLL, friable airways 10/14 gastric residuals 10/16 acidosis ?from diprivan >> change to versed gtt  STUDIES: 10/13 Rt thoracentesis > 500 ml fluid: transudate  10/14 Echo >> EF 55 to 60%, PAS 33 mmHg  SUBJECTIVE: O2 needs improved.  PHYSICAL EXAM: Vital signs in last 24 hours: Temp:  [98.1 F (36.7 C)-99 F (37.2 C)] 98.8 F (37.1 C) (10/16 1000) Pulse Rate:  [67-97] 87 (10/16 1000) Resp:  [0-32] 20 (10/16 1000) BP: (92-132)/(32-81) 95/54 mmHg (10/16 1000) SpO2:  [89 %-97 %] 90 % (10/16 1000) FiO2 (%):  [40 %-50 %] 40 % (10/16 1000) Weight:  [243 lb 13.3 oz (110.6 kg)] 243 lb 13.3 oz (110.6 kg) (10/16 0630) Last BM Date: 06/06/14 VENT SETTINGS: Vent Mode:  [-] PRVC FiO2 (%):  [40 %-50 %] 40 % Set Rate:  [24 bmp] 24 bmp Vt Set:  [580 mL] 580 mL PEEP:  [8 cmH20] 8 cmH20 Plateau Pressure:  [20 cmH20-25 cmH20] 21 cmH20 I/O: Intake/Output     10/15 0701 - 10/16 0700 10/16 0701 - 10/17 0700   I.V. (mL/kg) 3062.5 (27.7) 362.3 (3.3)   NG/GT     IV Piggyback 1450 600   Total Intake(mL/kg) 4512.5 (40.8) 962.3 (8.7)   Urine (mL/kg/hr) 3555 (1.3) 610 (1.6)    Total Output 3555 610   Net +957.5 +352.3        Stool Occurrence       PHYSICAL EXAMINATION:  General: no distress Neuro: RASS -3 HEENT: ETT in place Cardiovascular: regular Lungs: scattered rhonchi Abdomen: Soft, non-tender, diminished bowel sounds Musculoskeletal: 1+ edema Skin: no rashes  Lab Results: CBC Recent Labs     06/10/14  0520  06/11/14  0345  06/12/14  0545  WBC  16.6*  12.1*  10.4  HGB  8.2*  8.1*  8.6*  HCT  26.8*  26.5*  27.3*  PLT  212  178  202   BMET Recent Labs     06/10/14  0520  06/11/14  0345  06/12/14  0545  NA  143  140  140  K  4.0  3.8  4.4  CL  111  111  112  CO2  19  13*  13*  BUN  7  9  9   CREATININE  0.82  1.09  1.14  GLUCOSE  103*  113*  119*    Electrolytes Recent Labs     06/09/14  1048  06/10/14  0520  06/11/14  0345  06/12/14  0545  CALCIUM   --   6.9*  7.2*  7.6*  MG  1.5   --    --   2.1  PHOS   --    --    --   2.1*  ABG Recent Labs     06/10/14  1103  06/11/14  0505  06/12/14  0400  PHART  7.351  7.362  7.372  PCO2ART  30.8*  27.5*  26.1*  PO2ART  71.0*  69.5*  70.0*    Liver Enzymes Recent Labs     06/11/14  0345  06/12/14  0545  AST  78*  83*  ALT  15  16  ALKPHOS  170*  170*  BILITOT  2.3*  2.0*  ALBUMIN  2.1*  2.1*   Glucose Recent Labs     06/11/14  1211  06/11/14  1548  06/11/14  1951  06/11/14  2327  06/11/14  2351  06/12/14  0353  GLUCAP  132*  109*  113*  118*  121*  123*    Imaging Dg Chest Port 1 View  06/12/2014   CLINICAL DATA:  Intubated patient and follow-up pneumonia. History of hypertension and diabetes.  EXAM: PORTABLE CHEST - 1 VIEW  COMPARISON:  06/11/2014  FINDINGS: Endotracheal tube is 4.3 cm above the carina. Nasogastric tube extends into the abdomen. Left jugular central line tip in the upper SVC region. Persistent basilar densities suggest pleural fluid and volume loss. Bilateral lung densities are suggestive for edema. Negative for a pneumothorax. Overall,  there is slightly improved aeration of the lungs.  IMPRESSION: Improved aeration of the lungs may represent decreased pulmonary edema.  Bibasilar densities suggest pleural fluid and atelectasis/consolidation.  Support apparatuses as described.   Electronically Signed   By: Richarda OverlieAdam  Henn M.D.   On: 06/12/2014 07:50   Dg Chest Port 1 View  06/11/2014   CLINICAL DATA:  Pneumonia, followup  EXAM: PORTABLE CHEST - 1 VIEW  COMPARISON:  Portable chest x-ray of 06/10/2014  FINDINGS: There has been interval change in the primarily perihilar infiltrates noted yesterday. This may represent edema which is improving. There do appear to be effusions left greater than right with left basilar opacity consistent with atelectasis or pneumonia as well. Cardiomegaly is stable. The tip of the endotracheal to is approximately 4.3 cm above the carina. Left central venous line is unchanged in position.  IMPRESSION: 1. Some improvement in perihilar airspace disease possibly representing edema in view of the interval change. 2. Persistent left lower lobe opacity may represent atelectasis, pneumonia, and possible effusion.   Electronically Signed   By: Dwyane DeePaul  Barry M.D.   On: 06/11/2014 08:08   Dg Abd Portable 1v  06/11/2014   CLINICAL DATA:  Ileus  EXAM: PORTABLE ABDOMEN - 1 VIEW  COMPARISON:  None.  FINDINGS: Nasogastric tube tip and side port are in the stomach. The overall bowel gas pattern is unremarkable. No obstruction or free air is seen on this supine examination. There is evidence of old rib trauma on the left with remodeling.  IMPRESSION: Bowel gas pattern unremarkable. Nasogastric tube tip and side port in stomach.   Electronically Signed   By: Bretta BangWilliam  Woodruff M.D.   On: 06/11/2014 08:35    ASSESSMENT / PLAN:  PULMONARY  OETT 10/12 >> A:  Acute hypoxic respiratory failure 2nd to PNA, and b/l pleural effusions (transudate on Rt). P:  Wean PEEP/FiO2 per ARDS protocol F/u CXR Might be able to start pressure support  wean over weekend  CARDIOVASCULAR  A:  Hypotension 2nd to sedation meds. Hypervolemia. P:  Lasix 40 mg IV q12h on 10/16 Pressors to keep SBP > 90, MAP > 65  RENAL  A:  Stage III CKD. Non gap metabolic acidosis ?  from diprivan. P:  Monitor renal fx, urine outpt F/u ABG, BMET Change sedation 10/16   GASTROINTESTINAL  A:  Severe protein calorie malnutrition. Hx of ETOH with cirrhosis. Difficulty tolerating tube feeds. P:  NPO Resume tube feeds 10/16 >> keep at 20 ml/hr Protonix for SUP  HEMATOLOGIC  A:  Anemia from chronic GI bleeding >> stable at present. P:  F/u CBC Transfuse for hgb <7.0   INFECTIOUS  A:  Right > left PNA. HCAP vs aspiration.  Flu negative. MRSA swab positive. P:  Day 6/7 of Abx, currently on cefepime D/c vancomycin 10/16  ENDOCRINE  A:  DMII with nephropathy. P:  SSI  NEUROLOGIC  A:  Acute encephalopathy 2nd to delirium tremens, respiratory failure. P:  RASS goal -1 D/c diprivan 10/16 with concern for acidosis Start versed gtt 10/16 with prn fentanyl Continue thiamine, folic acid Foot drop boots  Family updated:  Parents updated at bedside 10/16  Interdisciplinary meeting (due by day 10/19):  CC time 35 minutes.  Coralyn Helling, MD [facility] Pulmonary/Critical Care 06/12/2014, 10:22 AM Pager:  [phone] After 3pm call: [phone]

## 2014-06-12 NOTE — Progress Notes (Addendum)
NUTRITION FOLLOW UP  Intervention:    -D/t ARDS protocol: Recommend Oxepa at trickle feeds of 20 ml/hr to provide 720 kcal (38% est kcal needs), and 30 gram protein (33% est protein needs) -As tolerated: Increase Oxepa by 10 ml every 6 hours to goal rate of 50 ml/hr with Pro-Stat 30 ml once daily to provide 1900 kcal (100% est kcal needs), 90 gram protein (90% est protein needs) and 942 ml free water -Monitor phosphorus and replete as warranted -RD to follow  Nutrition Dx:   Inadequate oral intake related to inability to eat as evidenced by npo status; ongoing   Goal:   Diet advancement. Patient to meet estimated needs with meals and supplements.   New Goal: TF to meet >/= 90% of their estimated nutrition needs; not met with trickle feeds    Monitor:   TF tolerance, total protein/energy intake, labs, weights, diet order, sedation  Assessment:   10/12:  NPO on c pap. Currently being intubated.  Ate 1/2 omelet Saturday. Mother reports that patient has been afraid of eating for some time for fear of vomiting.  Patient last seen by RD 03/20/14. Diagnosed with severe malnutrition at that time due to weight loss and poor intake.  Weight has increased from 190 lbs in July to 208 lbs currently. Probably fluid gain.  Intake has been poor prior to admit.  Patient is currently intubated on ventilator support  MV: 11.6 L/min  Temp (24hrs), Avg:99 F (37.2 C), Min:98.3 F (36.8 C), Max:99.7 F (37.6 C)   Propofol: 20 ml/hr   10/13: Patient remains intubated on ventilator support MV: 11.2 L/min Temp (24hrs), Avg:98.6 F (37 C), Min:98.1 F (36.7 C), Max:99 F (37.2 C)  Propofol: 36.8 ml/hr  Vital AF 1.2 initiated at 20 ml/hr on 10/13 with goal rate of 60 ml/hr. RD to order adult enteral protocol.  MD noted pt increasingly agitated on ventilator. Propofol level increased, providing approximately 972 kcal/day. Low K levels being repleted. Weight increased 8 lbs since admit, likely  d/t fluid Pt s/p thoracentesis with 500 ml fluid removal  10/15: -Pt's tube feeding on hold d/t elevated gastric residuals.  -Vital AF 1.2 had been running at 25 ml/hr. On 10/14, residuals > 500. OGT clamped. Residuals 350 ml on 10/15, bile looking appearance per RN -Per discussion with RN, pt's tube feed to be held for 24 hours before restarting. Gastric residuals likely related to sedation, and bowel function will likely improve with sedation weaning. RN noted only minimal bowel sounds -Propofol was decreased earlier this morning to 19.8 ml/hr for WUA; and then was increased back to 36.8 ml/hr  -MD recommended f/u abd xray -Continue with current TF recommendation to be reinitiated per MD discretion, will monitor propofol levels/possible weaning and adjust as warranted -Total bili elevated, likely d/t cirrhosis of liver and hx of ETOH -Weight increased from 40 lb from admit, likely d/t fluid as pt with +2 generalized edema and +5 L fluid balance  10/16: -Received consult to initiate TF. Per discussion with RN, MD requesting trickle feeds at this time to assess tolerance, and increase to goal rate when able -Pharm noted pt with elevated triglycerides (> 400). Per RN, propofol is d/c and Versed will be used for sedation -Phosphorus slightly low at 2.1, K/Mg WNL    Height: Ht Readings from Last 1 Encounters:  06/10/14 _0  (1.778 m)    Weight Status:   Wt Readings from Last 1 Encounters:  06/12/14 243 lb 13.3 oz (110.6  kg)  06/05/14 208 lb (94.5 kg)  Re-estimated needs:  Kcal: 1883 Protein: 100-115 gram  Fluid: 2.4L  Skin: WDL  Diet Order: NPO   Intake/Output Summary (Last 24 hours) at 06/12/14 1109 Last data filed at 06/12/14 1000  Gross per 24 hour  Intake 4319.1 ml  Output   3615 ml  Net  704.1 ml    Last BM: 10/11   Labs:   Recent Labs Lab 06/08/14 0800  06/09/14 1048 06/10/14 0520 06/11/14 0345 06/12/14 0545  NA  --   < >  --  143 140 140  K  --   <  > 3.5* 4.0 3.8 4.4  CL  --   < >  --  111 111 112  CO2  --   < >  --  19 13* 13*  BUN  --   < >  --  _0 CREATININE  --   < >  --  0.82 1.09 1.14  CALCIUM  --   < >  --  6.9* 7.2* 7.6*  MG 1.5  --  1.5  --   --  2.1  PHOS  --   --   --   --   --  2.1*  GLUCOSE  --   < >  --  103* 113* 119*  < > = values in this interval not displayed.  CBG (last 3)   Recent Labs  06/11/14 2327 06/11/14 2351 06/12/14 0353  GLUCAP 118* 121* 123*    Scheduled Meds: . antiseptic oral rinse  7 mL Mouth Rinse QID  . ceFEPime (MAXIPIME) IV  1 g Intravenous Q8H  . chlorhexidine  15 mL Mouth Rinse BID  . feeding supplement (OXEPA)  1,000 mL Per Tube Q24H  . folic acid  1 mg Intravenous Daily  . furosemide  40 mg Intravenous Q12H  . insulin aspart  0-9 Units Subcutaneous 6 times per day  . pantoprazole (PROTONIX) IV  40 mg Intravenous Q12H  . sodium chloride  3 mL Intravenous Q12H  . thiamine IV  100 mg Intravenous Daily    Continuous Infusions: . sodium chloride 10 mL/hr at 06/12/14 1050  . feeding supplement (VITAL AF 1.2 CAL) 1,000 mL (06/12/14 1106)  . midazolam (VERSED) infusion 4 mg/hr (06/12/14 1100)  . phenylephrine (NEO-SYNEPHRINE) Adult infusion 10 mcg/min (06/12/14 1102)    Atlee Abide MS RD LDN Clinical Dietitian [phone]

## 2014-06-13 ENCOUNTER — Inpatient Hospital Stay (HOSPITAL_COMMUNITY): Payer: BC Managed Care – PPO

## 2014-06-13 DIAGNOSIS — K7290 Hepatic failure, unspecified without coma: Secondary | ICD-10-CM

## 2014-06-13 DIAGNOSIS — K7682 Hepatic encephalopathy: Secondary | ICD-10-CM

## 2014-06-13 DIAGNOSIS — G9340 Encephalopathy, unspecified: Secondary | ICD-10-CM

## 2014-06-13 LAB — BASIC METABOLIC PANEL
ANION GAP: 13 (ref 5–15)
BUN: 9 mg/dL (ref 6–23)
CO2: 16 mEq/L — ABNORMAL LOW (ref 19–32)
CREATININE: 1.16 mg/dL (ref 0.50–1.35)
Calcium: 8.1 mg/dL — ABNORMAL LOW (ref 8.4–10.5)
Chloride: 115 mEq/L — ABNORMAL HIGH (ref 96–112)
GFR, EST NON AFRICAN AMERICAN: 80 mL/min — AB (ref >90)
Glucose, Bld: 112 mg/dL — ABNORMAL HIGH (ref 70–99)
Potassium: 3.9 mEq/L (ref 3.7–5.3)
SODIUM: 144 meq/L (ref 137–147)

## 2014-06-13 LAB — GLUCOSE, CAPILLARY
GLUCOSE-CAPILLARY: 121 mg/dL — AB (ref 70–99)
GLUCOSE-CAPILLARY: 122 mg/dL — AB (ref 70–99)
GLUCOSE-CAPILLARY: 111 mg/dL — AB (ref 70–99)
Glucose-Capillary: 125 mg/dL — ABNORMAL HIGH (ref 70–99)
Glucose-Capillary: 106 mg/dL — ABNORMAL HIGH (ref 70–99)
Glucose-Capillary: 113 mg/dL — ABNORMAL HIGH (ref 70–99)
Glucose-Capillary: 125 mg/dL — ABNORMAL HIGH (ref 70–99)
Glucose-Capillary: 128 mg/dL — ABNORMAL HIGH (ref 70–99)
Glucose-Capillary: 115 mg/dL — ABNORMAL HIGH (ref 70–99)

## 2014-06-13 LAB — CBC
HEMATOCRIT: 26.9 % — AB (ref 39.0–52.0)
Hemoglobin: 7.9 g/dL — ABNORMAL LOW (ref 13.0–17.0)
MCH: 24.3 pg — ABNORMAL LOW (ref 26.0–34.0)
MCHC: 29.4 g/dL — ABNORMAL LOW (ref 30.0–36.0)
MCV: 82.8 fL (ref 78.0–100.0)
PLATELETS: 190 10*3/uL (ref 150–400)
RBC: 3.25 MIL/uL — ABNORMAL LOW (ref 4.22–5.81)
RDW: 20.3 % — AB (ref 11.5–15.5)
WBC: 9.6 10*3/uL (ref 4.0–10.5)

## 2014-06-13 LAB — AMMONIA: Ammonia: 77 umol/L — ABNORMAL HIGH (ref 11–60)

## 2014-06-13 MED ORDER — FUROSEMIDE 10 MG/ML IJ SOLN
40.0000 mg | Freq: Two times a day (BID) | INTRAMUSCULAR | Status: DC
  Administered 2014-06-13 – 2014-06-15 (×6): 40 mg via INTRAVENOUS
  Filled 2014-06-13 (×6): qty 4

## 2014-06-13 MED ORDER — LACTULOSE 10 GM/15ML PO SOLN
30.0000 g | Freq: Three times a day (TID) | ORAL | Status: DC
  Administered 2014-06-13 – 2014-06-14 (×3): 30 g
  Filled 2014-06-13 (×3): qty 45

## 2014-06-13 MED ORDER — RIFAXIMIN 200 MG PO TABS
400.0000 mg | ORAL_TABLET | Freq: Three times a day (TID) | ORAL | Status: DC
  Administered 2014-06-13 – 2014-06-22 (×27): 400 mg via ORAL
  Filled 2014-06-13 (×29): qty 2

## 2014-06-13 MED ORDER — FUROSEMIDE 10 MG/ML IJ SOLN: 40.0000 mg | Freq: Once | INTRAMUSCULAR | Status: DC

## 2014-06-13 MED ORDER — POTASSIUM CHLORIDE 20 MEQ/15ML (10%) PO LIQD
20.0000 meq | Freq: Two times a day (BID) | ORAL | Status: DC
  Administered 2014-06-13 – 2014-06-28 (×31): 20 meq
  Filled 2014-06-13 (×33): qty 15

## 2014-06-13 NOTE — Progress Notes (Signed)
PULMONARY / CRITICAL CARE MEDICINE  Name: Jerome [name] MRN: [id number] DOB:  10/28/1975 ADMISSION DATE: 06/05/2014  CONSULTATION DATE: 06/08/2014  REFERRING MD : TRH  CHIEF COMPLAINT: Respiratory Failure  INITIAL PRESENTATION:  38 yo male with hx of ETOH presented with dyspnea/fatigue from profound anemia (Hb 4.5), and received multiple transfusions of PRBC.  Developed progressive respiratory failure with Rt > Lt pulmonary infiltrates, and PCCM assumed care in ICU.  SIGNIFICANT EVENTS:  10/09 admitted; PRBC x3, GI consulted 10/10 PRBC x1 10/11 PRBC x1; new onset PNA on chest x ray 10/12: acute hypoxic respiratory failure overnight requiring BiPAP. Alcohol withdrawal requiring precedex gtt. Intubated for decreasing LOC and increasing 02 needs and worsening PNA on CXR.  10/13 bronchoscopy >> respiratory secretions from RLL, friable airways  - 10/13 Rt thoracentesis > 500 ml fluid: transudate  10/14 gastric residuals  - 10/14 Echo >> EF 55 to 60%, PAS 33 mmHg 10/16 acidosis ?from diprivan >> change to versed gtt     SUBJECTIVE: 06/13/14:  Significant intermittent agitation. Did some weaning. On 45% fio2.hick tenacious ET tube secretions +    Mild tube feed residuals continue. He is on fent 150mcg and versed 6mg /h gtt   PHYSICAL EXAM: Vital signs in last 24 hours: Temp:  [98.6 F (37 C)-99.7 F (37.6 C)] 98.8 F (37.1 C) (10/17 1300) Pulse Rate:  [89-117] 89 (10/17 1300) Resp:  [14-33] 15 (10/17 1300) BP: (94-144)/(43-93) 95/57 mmHg (10/17 1300) SpO2:  [88 %-94 %] 92 % (10/17 1300) FiO2 (%):  [40 %-45 %] 45 % (10/17 1200) Weight:  [108 kg (238 lb 1.6 oz)] 108 kg (238 lb 1.6 oz) (10/17 0400) Last BM Date: 06/06/14 VENT SETTINGS: Vent Mode:  [-] PRVC FiO2 (%):  [40 %-45 %] 45 % Set Rate:  [16 bmp] 16 bmp Vt Set:  [580 mL] 580 mL PEEP:  [8 cmH20] 8 cmH20 Pressure Support:  [5 cmH20] 5 cmH20 Plateau Pressure:  [18 cmH20-27 cmH20] 18 cmH20 I/O: Intake/Output     10/16  0701 - 10/17 0700 10/17 0701 - 10/18 0700   I.V. (mL/kg) 1226.2 (11.4) 208 (1.9)   Other 305    NG/GT 422.7 100   IV Piggyback 700 50   Total Intake(mL/kg) 2653.9 (24.6) 358 (3.3)   Urine (mL/kg/hr) 7525 (2.9) 400 (0.6)   Emesis/NG output  240 (0.4)   Total Output 7525 640   Net -4871.1 -282          PHYSICAL EXAMINATION:  General: no distress Neuro: RASS -3 and fluctutates to +3 HEENT: ETT in place Cardiovascular: regular Lungs: scattered rhonchi Abdomen: Soft, non-tender, diminished bowel sounds Musculoskeletal: 1+ edema Skin: no rashes  Lab Results: PULMONARY  Recent Labs Lab 06/08/14 0307 06/08/14 1215 06/10/14 1103 06/11/14 0505 06/12/14 0400  PHART 7.412 7.323* 7.351 7.362 7.372  PCO2ART 30.3* 41.3 30.8* 27.5* 26.1*  PO2ART 120.0* 85.2 71.0* 69.5* 70.0*  HCO3 18.9* 20.8 16.6* 15.2* 14.8*  TCO2 18.0 19.8 15.9 14.6 14.1  O2SAT 98.7 95.2 94.6 93.4 93.8    CBC  Recent Labs Lab 06/11/14 0345 06/12/14 0545 06/13/14 0530  HGB 8.1* 8.6* 7.9*  HCT 26.5* 27.3* 26.9*  WBC 12.1* 10.4 9.6  PLT 178 202 190    COAGULATION No results found for this basename: INR,  in the last 168 hours  CARDIAC  No results found for this basename: TROPONINI,  in the last 168 hours No results found for this basename: PROBNP,  in the last 168 hours   CHEMISTRY  Recent Labs Lab 06/08/14 0745 06/08/14 0800 06/09/14 0500 06/09/14 1048 06/10/14 0520 06/11/14 0345 06/12/14 0545 06/13/14 0530  NA 141  --  142  --  143 140 140 144  K 3.3*  --  3.2* 3.5* 4.0 3.8 4.4 3.9  CL 108  --  109  --  111 111 112 115*  CO2 20  --  21  --  19 13* 13* 16*  GLUCOSE 108*  --  92  --  103* 113* 119* 112*  BUN 8  --  8  --  7 9 9 9   CREATININE 0.71  --  0.66  --  0.82 1.09 1.14 1.16  CALCIUM 7.7*  --  7.1*  --  6.9* 7.2* 7.6* 8.1*  MG  --  1.5  --  1.5  --   --  2.1  --   PHOS  --   --   --   --   --   --  2.1*  --    Estimated Creatinine Clearance: 108.3 ml/min (by C-G formula based  on Cr of 1.16).   LIVER  Recent Labs Lab 06/08/14 0745 06/09/14 0500 06/11/14 0345 06/12/14 0545  AST 74* 85* 78* 83*  ALT 14 15 15 16   ALKPHOS 147* 141* 170* 170*  BILITOT 3.4* 2.4* 2.3* 2.0*  PROT 6.6 6.1 6.0 6.3  ALBUMIN 2.6* 2.4* 2.1* 2.1*     INFECTIOUS No results found for this basename: LATICACIDVEN, PROCALCITON,  in the last 168 hours   ENDOCRINE CBG (last 3)   Recent Labs  06/13/14 0423 06/13/14 0755 06/13/14 1142  GLUCAP 125* 111* 128*         IMAGING x48h Dg Chest Port 1 View  06/13/2014   CLINICAL DATA:  Pneumonia, diabetes  EXAM: PORTABLE CHEST - 1 VIEW  COMPARISON:  Radiograph 06/12/2014  FINDINGS: Endotracheal tube and NG tube are unchanged. Left central venous line unchanged. Stable enlarged cardiac silhouette. Low lung volumes a bilateral pleural effusions. Central venous congestion is slightly increased compared to prior. No pneumothorax.  IMPRESSION: 1. Stable support apparatus. 2. Low lung volumes and bilateral pleural effusions unchanged. 3. Slight increase in central venous congestion.   Electronically Signed   By: Genevive BiStewart  Edmunds M.D.   On: 06/13/2014 07:46   Dg Chest Port 1 View  06/12/2014   CLINICAL DATA:  Intubated patient and follow-up pneumonia. History of hypertension and diabetes.  EXAM: PORTABLE CHEST - 1 VIEW  COMPARISON:  06/11/2014  FINDINGS: Endotracheal tube is 4.3 cm above the carina. Nasogastric tube extends into the abdomen. Left jugular central line tip in the upper SVC region. Persistent basilar densities suggest pleural fluid and volume loss. Bilateral lung densities are suggestive for edema. Negative for a pneumothorax. Overall, there is slightly improved aeration of the lungs.  IMPRESSION: Improved aeration of the lungs may represent decreased pulmonary edema.  Bibasilar densities suggest pleural fluid and atelectasis/consolidation.  Support apparatuses as described.   Electronically Signed   By: Richarda OverlieAdam  Henn M.D.   On:  06/12/2014 07:50       ASSESSMENT / PLAN:  PULMONARY  OETT 10/12 >> A:  Acute hypoxic respiratory failure 2nd to PNA, and b/l pleural effusions (transudate on Rt).    - does not meet sbt criteria due to agitation  P:  Wean PEEP/FiO2 per ARDS protocol F/u CXR SBT as tolerated   CARDIOVASCULAR  A:  Hypotension 2nd to sedation meds. Hypervolemia.   - persists 06/13/14 P:  Lasix 40  mg IV q12h on 10/16; start schedule 10/17 Pressors to keep SBP > 90, MAP > 65  RENAL  A:  Stage III CKD. Non gap metabolic acidosis ?from diprivan. 06/12/14    - normal creat 06/13/14   P:  Monitor renal fx, urine outpt F/u ABG, BMET    GASTROINTESTINAL  A:  Severe protein calorie malnutrition. Hx of ETOH with cirrhosis. Difficulty tolerating tube feeds. - persists 06/13/14  P:  NPO Resume tube feeds 10/16 >> keep at 20 ml/hr Protonix for SUP  HEMATOLOGIC  A:  Anemia from chronic GI bleeding >> stable at present. P:  F/u CBC Transfuse for hgb <7.0   INFECTIOUS  A:  Right > left PNA. HCAP vs aspiration.  Flu negative. MRSA swab positive. P:  Day 6/7 of Abx, currently on cefepime D/c vancomycin 10/16  ENDOCRINE  A:  DMII with nephropathy. P:  SSI  NEUROLOGIC  A:  Acute encephalopathy 2nd to delirium tremens, respiratory failure.    - 06/13/14: encephalopathy continues. Possible associted hepatic encephalopathy + (off diprivan 10/16)  P:  RASS goal -1 Continuet versed gtt 10/16 with prn fentanyl and fent gtt ADD LACTULOSE and XIFAXAN 06/13/14 due to possible heaptic encephalopathy (check ammonia levels) Continue thiamine, folic acid Foot drop boots  Family updated:  Parents updated at bedside 10/17  Interdisciplinary meeting (due by day 10/19):   The patient is critically ill with multiple organ systems failure and requires high complexity decision making for assessment and support, frequent evaluation and titration of therapies, application of advanced  monitoring technologies and extensive interpretation of multiple databases.   Critical Care Time devoted to patient care services described in this note is  35  Minutes. This time reflects time of care of this signee Dr Kalman Shan. This critical care time does not reflect procedure time, or teaching time or supervisory time of PA/NP/Med student/Med Resident etc but could involve care discussion time    Dr. Kalman Shan, M.D., [facility].C.P Pulmonary and Critical Care Medicine Staff Physician [facility] Pulmonary and Critical Care Pager: [phone], If no answer or between  15:00h - 7:00h: call [phone]  06/13/2014 1:39 PM

## 2014-06-14 ENCOUNTER — Inpatient Hospital Stay (HOSPITAL_COMMUNITY): Payer: BC Managed Care – PPO

## 2014-06-14 DIAGNOSIS — G9340 Encephalopathy, unspecified: Secondary | ICD-10-CM

## 2014-06-14 DIAGNOSIS — I8510 Secondary esophageal varices without bleeding: Secondary | ICD-10-CM

## 2014-06-14 DIAGNOSIS — F1010 Alcohol abuse, uncomplicated: Secondary | ICD-10-CM

## 2014-06-14 DIAGNOSIS — K7290 Hepatic failure, unspecified without coma: Secondary | ICD-10-CM

## 2014-06-14 LAB — CBC WITH DIFFERENTIAL/PLATELET
BASOS ABS: 0.2 10*3/uL — AB (ref 0.0–0.1)
BASOS PCT: 2 % — AB (ref 0–1)
EOS PCT: 4 % (ref 0–5)
Eosinophils Absolute: 0.5 10*3/uL (ref 0.0–0.7)
HCT: 28.2 % — ABNORMAL LOW (ref 39.0–52.0)
Hemoglobin: 8.5 g/dL — ABNORMAL LOW (ref 13.0–17.0)
LYMPHS PCT: 19 % (ref 12–46)
Lymphs Abs: 2.2 10*3/uL (ref 0.7–4.0)
MCH: 25.3 pg — ABNORMAL LOW (ref 26.0–34.0)
MCHC: 30.1 g/dL (ref 30.0–36.0)
MCV: 83.9 fL (ref 78.0–100.0)
Monocytes Absolute: 1.2 10*3/uL — ABNORMAL HIGH (ref 0.1–1.0)
Monocytes Relative: 11 % (ref 3–12)
NEUTROS ABS: 7.2 10*3/uL (ref 1.7–7.7)
Neutrophils Relative %: 64 % (ref 43–77)
PLATELETS: 204 10*3/uL (ref 150–400)
RBC: 3.36 MIL/uL — ABNORMAL LOW (ref 4.22–5.81)
RDW: 20.7 % — AB (ref 11.5–15.5)
WBC: 11.3 10*3/uL — AB (ref 4.0–10.5)

## 2014-06-14 LAB — BASIC METABOLIC PANEL
ANION GAP: 11 (ref 5–15)
BUN: 11 mg/dL (ref 6–23)
CO2: 20 mEq/L (ref 19–32)
CREATININE: 1.17 mg/dL (ref 0.50–1.35)
Calcium: 8.6 mg/dL (ref 8.4–10.5)
Chloride: 116 mEq/L — ABNORMAL HIGH (ref 96–112)
GFR calc non Af Amer: 79 mL/min — ABNORMAL LOW (ref >90)
Glucose, Bld: 116 mg/dL — ABNORMAL HIGH (ref 70–99)
POTASSIUM: 4.1 meq/L (ref 3.7–5.3)
Sodium: 147 mEq/L (ref 137–147)

## 2014-06-14 LAB — GLUCOSE, CAPILLARY
GLUCOSE-CAPILLARY: 107 mg/dL — AB (ref 70–99)
GLUCOSE-CAPILLARY: 118 mg/dL — AB (ref 70–99)
Glucose-Capillary: 100 mg/dL — ABNORMAL HIGH (ref 70–99)
Glucose-Capillary: 100 mg/dL — ABNORMAL HIGH (ref 70–99)
Glucose-Capillary: 110 mg/dL — ABNORMAL HIGH (ref 70–99)
Glucose-Capillary: 114 mg/dL — ABNORMAL HIGH (ref 70–99)
Glucose-Capillary: 118 mg/dL — ABNORMAL HIGH (ref 70–99)
Glucose-Capillary: 122 mg/dL — ABNORMAL HIGH (ref 70–99)

## 2014-06-14 LAB — BLOOD GAS, ARTERIAL
ACID-BASE DEFICIT: 2.9 mmol/L — AB (ref 0.0–2.0)
Bicarbonate: 20.6 mEq/L (ref 20.0–24.0)
FIO2: 0.6 %
LHR: 16 {breaths}/min
MECHVT: 580 mL
O2 SAT: 98.1 %
PEEP: 10 cmH2O
Patient temperature: 37
TCO2: 19.4 mmol/L (ref 0–100)
pCO2 arterial: 32.3 mmHg — ABNORMAL LOW (ref 35.0–45.0)
pH, Arterial: 7.42 (ref 7.350–7.450)
pO2, Arterial: 100 mmHg (ref 80.0–100.0)

## 2014-06-14 LAB — MAGNESIUM: Magnesium: 1.7 mg/dL (ref 1.5–2.5)

## 2014-06-14 LAB — PHOSPHORUS: PHOSPHORUS: 2.9 mg/dL (ref 2.3–4.6)

## 2014-06-14 LAB — PROTIME-INR
INR: 1.4 (ref 0.00–1.49)
PROTHROMBIN TIME: 17.3 s — AB (ref 11.6–15.2)

## 2014-06-14 LAB — AMMONIA: Ammonia: 74 umol/L — ABNORMAL HIGH (ref 11–60)

## 2014-06-14 MED ORDER — FAMOTIDINE 40 MG/5ML PO SUSR: 20.0000 mg | Freq: Every day | ORAL | Status: DC

## 2014-06-14 MED ORDER — MAGNESIUM SULFATE 40 MG/ML IJ SOLN
2.0000 g | Freq: Once | INTRAMUSCULAR | Status: AC
  Administered 2014-06-14: 2 g via INTRAVENOUS
  Filled 2014-06-14: qty 50

## 2014-06-14 MED ORDER — LACTULOSE 10 GM/15ML PO SOLN
10.0000 g | Freq: Three times a day (TID) | ORAL | Status: DC
  Administered 2014-06-14 – 2014-06-16 (×8): 10 g
  Filled 2014-06-14 (×8): qty 15

## 2014-06-14 MED ORDER — RANITIDINE HCL 150 MG/10ML PO SYRP
150.0000 mg | ORAL_SOLUTION | Freq: Every day | ORAL | Status: DC
  Administered 2014-06-14 – 2014-07-08 (×25): 150 mg via ORAL
  Filled 2014-06-14 (×27): qty 10

## 2014-06-14 NOTE — Progress Notes (Signed)
eLink Physician-Brief Progress Note Patient Name: Jerome BodyWilliam [name] DOB: 03/11/1976 MRN: [id number]   Date of Service  06/14/2014  HPI/Events of Note  Sat limits set at 88%.  Elink informed by bedside nurse that pt on ARDS protocol.  ARDS protocol not ordered.  eICU Interventions  Ordered ARDS protocol.      Intervention Category Minor Interventions: Routine modifications to care plan (e.g. PRN medications for pain, fever)  Jerome [name] K. 06/14/2014, 6:29 PM

## 2014-06-14 NOTE — Progress Notes (Signed)
PULMONARY / CRITICAL CARE MEDICINE  Name: Jerome [name] MRN: [id number] DOB:  10/28/1975 ADMISSION DATE: 06/05/2014  CONSULTATION DATE: 06/08/2014  REFERRING MD : TRH  CHIEF COMPLAINT: Respiratory Failure  INITIAL PRESENTATION:  38 yo male with hx of ETOH presented with dyspnea/fatigue from profound anemia (Hb 4.5), and received multiple transfusions of PRBC.  Developed progressive respiratory failure with Rt > Lt pulmonary infiltrates, and PCCM assumed care in ICU.  SIGNIFICANT EVENTS:  10/09 admitted; PRBC x3, GI consulted 10/10 PRBC x1 10/11 PRBC x1; new onset PNA on chest x ray 10/12: acute hypoxic respiratory failure overnight requiring BiPAP. Alcohol withdrawal requiring precedex gtt. Intubated for decreasing LOC and increasing 02 needs and worsening PNA on CXR.  10/13 bronchoscopy >> respiratory secretions from RLL, friable airways  - 10/13 Rt thoracentesis > 500 ml fluid: transudate  10/14 gastric residuals  - 10/14 Echo >> EF 55 to 60%, PAS 33 mmHg 10/16 acidosis ?from diprivan >> change to versed gtt  06/13/14:  Significant intermittent agitation.  On 45% fio2.hick tenacious ET tube secretions + .  on fent 150mcg and versed 6mg /h gtt. START LACTULOSE AND XIFAXAN   SUBJECTIVE/OVERNIGHT/INTERVAL HX 06/14/14: incresed fent need to 175mcg and continued versed at 6mg . Significant diarrhea with lactulose. Continued agitation. Worsening hypoxemia to 60%. NEgative balance x 48h but still 6.8L positive x 7 days  PHYSICAL EXAM:  Vital signs in last 24 hours: Temp:  [98.6 F (37 C)-99.5 F (37.5 C)] 98.8 F (37.1 C) (10/18 1000) Pulse Rate:  [89-119] 94 (10/18 1000) Resp:  [15-27] 16 (10/18 1000) BP: (88-171)/(38-97) 106/52 mmHg (10/18 1000) SpO2:  [85 %-94 %] 94 % (10/18 1000) FiO2 (%):  [45 %-60 %] 60 % (10/18 0829) Weight:  [103.4 kg (227 lb 15.3 oz)] 103.4 kg (227 lb 15.3 oz) (10/18 0400) Last BM Date: 06/14/14 VENT SETTINGS: Vent Mode:  [-] PRVC FiO2 (%):  [45 %-60 %]  60 % Set Rate:  [16 bmp] 16 bmp Vt Set:  [580 mL] 580 mL PEEP:  [8 cmH20-10 cmH20] 10 cmH20 Plateau Pressure:  [15 cmH20-20 cmH20] 20 cmH20 I/O: Intake/Output     10/17 0701 - 10/18 0700 10/18 0701 - 10/19 0700   I.V. (mL/kg) 804.5 (7.8) 87 (0.8)   Other     NG/GT 750 20   IV Piggyback 200 50   Total Intake(mL/kg) 1754.5 (17) 157 (1.5)   Urine (mL/kg/hr) 5125 (2.1) 90 (0.2)   Emesis/NG output 240 (0.1)    Total Output 5365 90   Net -3610.5 +67        Stool Occurrence 2 x      PHYSICAL EXAMINATION:  General: no distress Neuro: RASS -3 and fluctutates to +3 HEENT: ETT in place Cardiovascular: regular Lungs: scattered rhonchi Abdomen: Soft, non-tender, diminished bowel sounds Musculoskeletal: 1+ edema Skin: no rashes  Lab Results: PULMONARY  Recent Labs Lab 06/08/14 0307 06/08/14 1215 06/10/14 1103 06/11/14 0505 06/12/14 0400  PHART 7.412 7.323* 7.351 7.362 7.372  PCO2ART 30.3* 41.3 30.8* 27.5* 26.1*  PO2ART 120.0* 85.2 71.0* 69.5* 70.0*  HCO3 18.9* 20.8 16.6* 15.2* 14.8*  TCO2 18.0 19.8 15.9 14.6 14.1  O2SAT 98.7 95.2 94.6 93.4 93.8    CBC  Recent Labs Lab 06/12/14 0545 06/13/14 0530 06/14/14 0453  HGB 8.6* 7.9* 8.5*  HCT 27.3* 26.9* 28.2*  WBC 10.4 9.6 11.3*  PLT 202 190 204    COAGULATION  Recent Labs Lab 06/14/14 0453  INR 1.40    CARDIAC  No results found for this  basename: TROPONINI,  in the last 168 hours No results found for this basename: PROBNP,  in the last 168 hours   CHEMISTRY  Recent Labs Lab 06/08/14 0745 06/08/14 0800  06/09/14 1048 06/10/14 0520 06/11/14 0345 06/12/14 0545 06/13/14 0530 06/14/14 0453  NA 141  --   < >  --  143 140 140 144 147  K 3.3*  --   < > 3.5* 4.0 3.8 4.4 3.9 4.1  CL 108  --   < >  --  111 111 112 115* 116*  CO2 20  --   < >  --  19 13* 13* 16* 20  GLUCOSE 108*  --   < >  --  103* 113* 119* 112* 116*  BUN 8  --   < >  --  7 9 9 9 11   CREATININE 0.71  --   < >  --  0.82 1.09 1.14 1.16 1.17   CALCIUM 7.7*  --   < >  --  6.9* 7.2* 7.6* 8.1* 8.6  MG  --  1.5  --  1.5  --   --  2.1  --  1.7  PHOS  --   --   --   --   --   --  2.1*  --  2.9  < > = values in this interval not displayed. Estimated Creatinine Clearance: 105.2 ml/min (by C-G formula based on Cr of 1.17).   LIVER  Recent Labs Lab 06/08/14 0745 06/09/14 0500 06/11/14 0345 06/12/14 0545 06/14/14 0453  AST 74* 85* 78* 83*  --   ALT 14 15 15 16   --   ALKPHOS 147* 141* 170* 170*  --   BILITOT 3.4* 2.4* 2.3* 2.0*  --   PROT 6.6 6.1 6.0 6.3  --   ALBUMIN 2.6* 2.4* 2.1* 2.1*  --   INR  --   --   --   --  1.40     INFECTIOUS No results found for this basename: LATICACIDVEN, PROCALCITON,  in the last 168 hours   ENDOCRINE CBG (last 3)   Recent Labs  06/14/14 0025 06/14/14 0449 06/14/14 0816  GLUCAP 114* 107* 118*         IMAGING x48h Dg Chest Port 1 View  06/14/2014   CLINICAL DATA:  Intubation.  EXAM: PORTABLE CHEST - 1 VIEW  COMPARISON:  06/13/2014  FINDINGS: Endotracheal tube has tip approximately 4.6 cm above the carina. Nasogastric tube courses through the region of the stomach and off the inferior portion of the film. Left IJ central venous catheter is unchanged with tip overlying the SVC.  Lungs are hypoinflated with continued perihilar and left basilar opacification likely mild vascular congestion with small left effusions/atelectasis unchanged. Stable cardiomegaly. Remainder the exam is unchanged.  IMPRESSION: Hypoinflation with continued bilateral perihilar and left basilar opacification likely vascular congestion with small left-sided effusions/atelectasis. Stable cardiomegaly.  Tubes and lines as described.   Electronically Signed   By: Elberta Fortisaniel  Boyle M.D.   On: 06/14/2014 08:02   Dg Chest Port 1 View  06/13/2014   CLINICAL DATA:  Pneumonia, diabetes  EXAM: PORTABLE CHEST - 1 VIEW  COMPARISON:  Radiograph 06/12/2014  FINDINGS: Endotracheal tube and NG tube are unchanged. Left central venous  line unchanged. Stable enlarged cardiac silhouette. Low lung volumes a bilateral pleural effusions. Central venous congestion is slightly increased compared to prior. No pneumothorax.  IMPRESSION: 1. Stable support apparatus. 2. Low lung volumes and bilateral pleural effusions unchanged.  3. Slight increase in central venous congestion.   Electronically Signed   By: Genevive Bi M.D.   On: 06/13/2014 07:46       ASSESSMENT / PLAN:  PULMONARY  OETT 10/12 >> A:  Acute hypoxic respiratory failure 2nd to PNA, and b/l pleural effusions (transudate on Rt).    - does not meet sbt criteria due to agitation and severe hypoxemia/ALI  P:  Wean PEEP/FiO2 per ARDS protocol F/u CXR SBT as tolerated   CARDIOVASCULAR  A:  Hypotension 2nd to sedation meds. Hypervolemia.   - persists 06/14/14 P:  Lasix 40 mg IV q12h on 10/16; start schedule 10/17 Pressors to keep SBP > 90, MAP > 65  RENAL  A:  Stage III CKD. Non gap metabolic acidosis ?from diprivan. 06/12/14    - normal creat 06/14/14 but mild low mag which was repleted by elink   P:  Monitor renal fx, urine outpt F/u ABG, BMET    GASTROINTESTINAL  A:  Severe protein calorie malnutrition.  ETOH with cirrhosis.  - Difficulty tolerating tube feeds. - persists 06/13/14  -  - constipation resolved 06/14/14 after lactulose. In fact having diarrhea  P:  NPO Resume tube feeds 10/16 >> keep at 20 ml/hr (hopefully clearance of constipation helps) Change protonix to h2 blockade due to diarrhea Continue lactulose but reduce dose due to diarrhea Continue xifaxan   HEMATOLOGIC  A:  Anemia from chronic GI bleeding >> stable at present. P:  F/u CBC Transfuse for hgb <7.0   INFECTIOUS  A:  Right > left PNA. HCAP vs aspiration.  Flu negative. MRSA swab positive. P:  Day 7/7 of Abx, currently on cefepime - stop 06/14/14 D/c vancomycin 10/16  ENDOCRINE  A:  DMII with nephropathy. P:  SSI  NEUROLOGIC  A:  Acute  encephalopathy 2nd to delirium tremens, respiratory failure.    - 06/14/14: encephalopathy continues. Possible associted hepatic encephalopathy +  With high Ammonia. Started Lactulose and Xifaxan 06/13/14 (off diprivan 10/16)  P:  RASS goal -1 Continuet versed gtt 10/16 with prn fentanyl and fent gtt Continue  LACTULOSE and XIFAXAN since  06/13/14 due to possible heaptic encephalopathy  Continue thiamine, folic acid Foot drop boots  Family updated:  Parents updated at bedside 10/17 and 06/14/14  Interdisciplinary meeting: held 06/14/14 at bedside with mom, dad, and sister and RN Amy: explained likely heading into prolonged mech vent state, chronic critical illness state and implications long term. Suggested meets indication for trach and need for LTACH. They seem inclined to move forward if does not wean off vent next few to several days   The patient is critically ill with multiple organ systems failure and requires high complexity decision making for assessment and support, frequent evaluation and titration of therapies, application of advanced monitoring technologies and extensive interpretation of multiple databases.   Critical Care Time devoted to patient care services described in this note is  35  Minutes. This time reflects time of care of this signee Dr Kalman Shan. This critical care time does not reflect procedure time, or teaching time or supervisory time of PA/NP/Med student/Med Resident etc but could involve care discussion time    Dr. Kalman Shan, M.D., [facility].C.P Pulmonary and Critical Care Medicine Staff Physician [facility] Pulmonary and Critical Care Pager: [phone], If no answer or between  15:00h - 7:00h: call [phone]  06/14/2014 11:14 AM

## 2014-06-14 NOTE — Progress Notes (Signed)
[facility] PR[facility] FOR AM LAB REPLACEMENT ONLY  The patient does apply [facility] the [facility] Electroly[facility] Protocol based on the criteria listed below:   1. Is GFR >/= 40 ml/min? Yes.    Patient's GFR today is 79 2. Is urine output >/= 0.5 ml/kg/hr for the last 6 hours? Yes.   Patient's UOP is 3.02 ml/kg/hr 3. Is BUN < 60 mg/dL? Yes.    Patient's BUN today is 11 4. Abnormal electrolyte(s): Mg 1.7 5. Ordered repletion with: per protocol 6. If a panic level lab has been reported, has the CCM MD in charge been notified? No..   Physician:    Markus DaftWHELAN, Telisa Ohlsen A 06/14/2014 6:17 AM

## 2014-06-15 ENCOUNTER — Inpatient Hospital Stay (HOSPITAL_COMMUNITY): Payer: BC Managed Care – PPO

## 2014-06-15 LAB — CBC WITH DIFFERENTIAL/PLATELET
BASOS PCT: 2 % — AB (ref 0–1)
Basophils Absolute: 0.2 10*3/uL — ABNORMAL HIGH (ref 0.0–0.1)
EOS PCT: 4 % (ref 0–5)
Eosinophils Absolute: 0.4 10*3/uL (ref 0.0–0.7)
HEMATOCRIT: 26.5 % — AB (ref 39.0–52.0)
HEMOGLOBIN: 8 g/dL — AB (ref 13.0–17.0)
LYMPHS PCT: 18 % (ref 12–46)
Lymphs Abs: 1.9 10*3/uL (ref 0.7–4.0)
MCH: 25.2 pg — ABNORMAL LOW (ref 26.0–34.0)
MCHC: 30.2 g/dL (ref 30.0–36.0)
MCV: 83.6 fL (ref 78.0–100.0)
Monocytes Absolute: 1.3 10*3/uL — ABNORMAL HIGH (ref 0.1–1.0)
Monocytes Relative: 12 % (ref 3–12)
NEUTROS ABS: 7 10*3/uL (ref 1.7–7.7)
Neutrophils Relative %: 64 % (ref 43–77)
Platelets: 201 10*3/uL (ref 150–400)
RBC: 3.17 MIL/uL — ABNORMAL LOW (ref 4.22–5.81)
RDW: 20.6 % — ABNORMAL HIGH (ref 11.5–15.5)
WBC: 10.8 10*3/uL — ABNORMAL HIGH (ref 4.0–10.5)

## 2014-06-15 LAB — GLUCOSE, CAPILLARY
GLUCOSE-CAPILLARY: 120 mg/dL — AB (ref 70–99)
GLUCOSE-CAPILLARY: 116 mg/dL — AB (ref 70–99)
GLUCOSE-CAPILLARY: 108 mg/dL — AB (ref 70–99)
Glucose-Capillary: 103 mg/dL — ABNORMAL HIGH (ref 70–99)
Glucose-Capillary: 129 mg/dL — ABNORMAL HIGH (ref 70–99)

## 2014-06-15 LAB — BASIC METABOLIC PANEL
Anion gap: 11 (ref 5–15)
BUN: 15 mg/dL (ref 6–23)
CHLORIDE: 116 meq/L — AB (ref 96–112)
CO2: 21 meq/L (ref 19–32)
Calcium: 8.6 mg/dL (ref 8.4–10.5)
Creatinine, Ser: 1.28 mg/dL (ref 0.50–1.35)
GFR calc Af Amer: 82 mL/min — ABNORMAL LOW (ref >90)
GFR calc non Af Amer: 71 mL/min — ABNORMAL LOW (ref >90)
Glucose, Bld: 117 mg/dL — ABNORMAL HIGH (ref 70–99)
Potassium: 4.1 mEq/L (ref 3.7–5.3)
Sodium: 148 mEq/L — ABNORMAL HIGH (ref 137–147)

## 2014-06-15 LAB — BLOOD GAS, ARTERIAL
ACID-BASE DEFICIT: 1.9 mmol/L (ref 0.0–2.0)
BICARBONATE: 22.5 meq/L (ref 20.0–24.0)
Drawn by: 249101
FIO2: 0.6 %
MECHVT: 580 mL
O2 Saturation: 99.3 %
PEEP: 10 cmH2O
Patient temperature: 98.6
RATE: 16 resp/min
TCO2: 21.2 mmol/L (ref 0–100)
pCO2 arterial: 39.3 mmHg (ref 35.0–45.0)
pH, Arterial: 7.376 (ref 7.350–7.450)
pO2, Arterial: 132 mmHg — ABNORMAL HIGH (ref 80.0–100.0)

## 2014-06-15 LAB — PHOSPHORUS: PHOSPHORUS: 3 mg/dL (ref 2.3–4.6)

## 2014-06-15 LAB — MAGNESIUM: Magnesium: 1.7 mg/dL (ref 1.5–2.5)

## 2014-06-15 MED ORDER — DEXMEDETOMIDINE HCL IN NACL 200 MCG/50ML IV SOLN
0.0000 ug/kg/h | INTRAVENOUS | Status: DC
  Administered 2014-06-15: 0.4 ug/kg/h via INTRAVENOUS
  Administered 2014-06-15: 0.5 ug/kg/h via INTRAVENOUS
  Administered 2014-06-15: 0.7 ug/kg/h via INTRAVENOUS
  Administered 2014-06-15: 0.5 ug/kg/h via INTRAVENOUS
  Administered 2014-06-16 (×2): 0.8 ug/kg/h via INTRAVENOUS
  Administered 2014-06-16: 0.6 ug/kg/h via INTRAVENOUS
  Administered 2014-06-16: 0.8 ug/kg/h via INTRAVENOUS
  Administered 2014-06-16: 0.6 ug/kg/h via INTRAVENOUS
  Administered 2014-06-16 (×2): 0.8 ug/kg/h via INTRAVENOUS
  Administered 2014-06-16: 0.7 ug/kg/h via INTRAVENOUS
  Administered 2014-06-17: 1.2 ug/kg/h via INTRAVENOUS
  Filled 2014-06-15 (×5): qty 50
  Filled 2014-06-15: qty 100
  Filled 2014-06-15: qty 50
  Filled 2014-06-15 (×2): qty 100
  Filled 2014-06-15 (×2): qty 50

## 2014-06-15 MED ORDER — MAGNESIUM SULFATE 40 MG/ML IJ SOLN
2.0000 g | Freq: Once | INTRAMUSCULAR | Status: AC
  Administered 2014-06-15: 2 g via INTRAVENOUS
  Filled 2014-06-15: qty 50

## 2014-06-15 NOTE — Progress Notes (Signed)
[facility] FOR AM LAB REPLACEMENT ONLY  The patient does [facility] for the [facility] Electroly[facility] Protocol based on the criteria listed below:   1. Is GFR >/= 40 ml/min? Yes.    Patient's GFR today is 71 2. Is urine output >/= 0.5 ml/kg/hr for the last 6 hours? Yes.   Patient's UOP is 1.4 ml/kg/hr 3. Is BUN < 60 mg/dL? Yes.    Patient's BUN today is 15 4. Abnormal electrolyte(s): Mg 1.7 5. Ordered repletion with: per protocol 6. If a panic level lab has been reported, has the CCM MD in charge been notified? No..   Physician:    Markus DaftWHELAN, Larrissa Stivers A 06/15/2014 5:50 AM

## 2014-06-15 NOTE — Progress Notes (Signed)
PULMONARY / CRITICAL CARE MEDICINE  Name: Jerome [name] MRN: [id number] DOB:  07-28-1976 ADMISSION DATE: 06/05/2014  CONSULTATION DATE: 06/08/2014  REFERRING MD : TRH  CHIEF COMPLAINT: Respiratory Failure  INITIAL PRESENTATION:  38 yo male with hx of ETOH presented with dyspnea/fatigue from profound anemia (Hb 4.5), and received multiple transfusions of PRBC.  Developed progressive respiratory failure with Rt > Lt pulmonary infiltrates, and PCCM assumed care in ICU.  SIGNIFICANT EVENTS:  10/09 admitted; PRBC x3, GI consulted 10/10 PRBC x1 10/11 PRBC x1; new onset PNA on chest x ray 10/12: acute hypoxic respiratory failure overnight requiring BiPAP. Alcohol withdrawal requiring precedex gtt. Intubated for decreasing LOC and increasing 02 needs and worsening PNA on CXR.  10/13 bronchoscopy >> respiratory secretions from RLL, friable airways  - 10/13 Rt thoracentesis > 500 ml fluid: transudate  10/14 gastric residuals  - 10/14 Echo >> EF 55 to 60%, PAS 33 mmHg 10/16 acidosis ?from diprivan >> change to versed gtt  06/13/14:  Significant intermittent agitation.  On 45% fio2.hick tenacious ET tube secretions + .  on fent and versed 6mg /h gtt. START LACTULOSE AND XIFAXAN 10/19 fio2 50 % and peep 12 on ARDS protocol   Intake/Output Summary (Last 24 hours) at 06/15/14 1016 Last data filed at 06/15/14 0700  Gross per 24 hour  Intake 1237.77 ml  Output   2950 ml  Net -1712.23 ml    SUBJECTIVE/OVERNIGHT/INTERVAL HX 10/19 negative I/O with Lasix, c x r looks better. Agitated or sedated.  PHYSICAL EXAM:  Vital signs in last 24 hours: Temp:  [99.3 F (37.4 C)-100.4 F (38 C)] 99.3 F (37.4 C) (10/19 0900) Pulse Rate:  [88-121] 92 (10/19 0900) Resp:  [15-20] 16 (10/19 0900) BP: (89-141)/(32-66) 101/52 mmHg (10/19 0900) SpO2:  [87 %-96 %] 93 % (10/19 0900) FiO2 (%):  [60 %] 60 % (10/19 0850) Weight:  [212 lb 1.3 oz (96.2 kg)] 212 lb 1.3 oz (96.2 kg) (10/19 0400) Last BM  Date: 06/15/14 VENT SETTINGS: Vent Mode:  [-] PRVC FiO2 (%):  [60 %] 60 % Set Rate:  [16 bmp] 16 bmp Vt Set:  [580 mL] 580 mL PEEP:  [10 cmH20] 10 cmH20 Plateau Pressure:  [17 cmH20-19 cmH20] 17 cmH20 I/O: Intake/Output     10/18 0701 - 10/19 0700 10/19 0701 - 10/20 0700   I.V. (mL/kg) 914.8 (9.5)    NG/GT 510    IV Piggyback 50    Total Intake(mL/kg) 1474.8 (15.3)    Urine (mL/kg/hr) 2840 (1.2)    Emesis/NG output     Stool 200 (0.1)    Total Output 3040     Net -1565.2            PHYSICAL EXAMINATION:  General: no distress, heavily sedated but agitated when sedation decreased. Neuro: RASS -2 and fluctutates to +3 HEENT: ETT in place, no jvd/LAN Cardiovascular: regular HSR RRR Lungs: scattered rhonchi, decreased in bases Abdomen: Soft, non-tender, + bowel sounds, TF's running Musculoskeletal: no edema Skin: warm/dry  Lab Results: PULMONARY  Recent Labs Lab 06/08/14 1215 06/10/14 1103 06/11/14 0505 06/12/14 0400 06/14/14 1202  PHART 7.323* 7.351 7.362 7.372 7.420  PCO2ART 41.3 30.8* 27.5* 26.1* 32.3*  PO2ART 85.2 71.0* 69.5* 70.0* 100.0  HCO3 20.8 16.6* 15.2* 14.8* 20.6  TCO2 19.8 15.9 14.6 14.1 19.4  O2SAT 95.2 94.6 93.4 93.8 98.1    CBC  Recent Labs Lab 06/13/14 0530 06/14/14 0453 06/15/14 0455  HGB 7.9* 8.5* 8.0*  HCT 26.9* 28.2* 26.5*  WBC  9.6 11.3* 10.8*  PLT 190 204 201    COAGULATION  Recent Labs Lab 06/14/14 0453  INR 1.40    CARDIAC  No results found for this basename: TROPONINI,  in the last 168 hours No results found for this basename: PROBNP,  in the last 168 hours   CHEMISTRY  Recent Labs Lab 06/09/14 1048  06/11/14 0345 06/12/14 0545 06/13/14 0530 06/14/14 0453 06/15/14 0455  NA  --   < > 140 140 144 147 148*  K 3.5*  < > 3.8 4.4 3.9 4.1 4.1  CL  --   < > 111 112 115* 116* 116*  CO2  --   < > 13* 13* 16* 20 21  GLUCOSE  --   < > 113* 119* 112* 116* 117*  BUN  --   < > 9 9 9 11 15   CREATININE  --   < > 1.611.09 1.14  1.16 1.17 1.28  CALCIUM  --   < > 7.2* 7.6* 8.1* 8.6 8.6  MG 1.5  --   --  2.1  --  1.7 1.7  PHOS  --   --   --  2.1*  --  2.9 3.0  < > = values in this interval not displayed. Estimated Creatinine Clearance: 92.9 ml/min (by C-G formula based on Cr of 1.28).   LIVER  Recent Labs Lab 06/09/14 0500 06/11/14 0345 06/12/14 0545 06/14/14 0453  AST 85* 78* 83*  --   ALT 15 15 16   --   ALKPHOS 141* 170* 170*  --   BILITOT 2.4* 2.3* 2.0*  --   PROT 6.1 6.0 6.3  --   ALBUMIN 2.4* 2.1* 2.1*  --   INR  --   --   --  1.40     INFECTIOUS No results found for this basename: LATICACIDVEN, PROCALCITON,  in the last 168 hours   ENDOCRINE CBG (last 3)   Recent Labs  06/14/14 2349 06/15/14 0520 06/15/14 0755  GLUCAP 122* 120* 116*         IMAGING x48h Dg Chest Port 1 View  06/15/2014   CLINICAL DATA:  Respiratory failure, endotracheal tube positioning. Subsequent encounter.  EXAM: PORTABLE CHEST - 1 VIEW  COMPARISON:  06/14/2014; 06/13/2014; 06/12/2014  FINDINGS: Grossly unchanged enlarged cardiac silhouette and mediastinal contours given reduced lung volumes and patient rotation. Stable position of support apparatus. Pulmonary vasculature remains indistinct with cephalization of flow. Grossly unchanged small layering left-sided effusion with associated slight worsening of left mid and lower lung opacities. Unchanged bones.  IMPRESSION: 1.  Stable positioning of support apparatus.  No pneumothorax. 2. Grossly unchanged findings of mild pulmonary edema and small layering left-sided pleural effusion.   Electronically Signed   By: Simonne ComeJohn  Watts M.D.   On: 06/15/2014 07:45   Dg Chest Port 1 View  06/14/2014   CLINICAL DATA:  Intubation.  EXAM: PORTABLE CHEST - 1 VIEW  COMPARISON:  06/13/2014  FINDINGS: Endotracheal tube has tip approximately 4.6 cm above the carina. Nasogastric tube courses through the region of the stomach and off the inferior portion of the film. Left IJ central venous  catheter is unchanged with tip overlying the SVC.  Lungs are hypoinflated with continued perihilar and left basilar opacification likely mild vascular congestion with small left effusions/atelectasis unchanged. Stable cardiomegaly. Remainder the exam is unchanged.  IMPRESSION: Hypoinflation with continued bilateral perihilar and left basilar opacification likely vascular congestion with small left-sided effusions/atelectasis. Stable cardiomegaly.  Tubes and lines as described.  Electronically Signed   By: Elberta Fortis M.D.   On: 06/14/2014 08:02       ASSESSMENT / PLAN:  PULMONARY  OETT 10/12 >> A:  Acute hypoxic respiratory failure 2nd to PNA, and b/l pleural effusions (transudate on Rt).    - does not meet sbt criteria due to agitation and severe hypoxemia/ALI -better with diuresis  P:  Wean PEEP/FiO2 per ARDS protocol F/u CXR SBT as tolerated Diuresis as tolerated   CARDIOVASCULAR  A:  Hypotension 2nd to sedation meds.(resolved off pressors) Hypervolemia.   P:  Lasix 40 mg IV q12h on 10/16; start schedule 10/17 Pressors to keep SBP > 90, MAP > 65  RENAL  A:  Stage III CKD. Non gap metabolic acidosis ?from diprivan. 06/12/14    - normal creat 06/15/14 but mild low mag which was repleted by elink   P:  Monitor renal fx, urine outpt F/u ABG, BMET    GASTROINTESTINAL  A:  Severe protein calorie malnutrition.  ETOH with cirrhosis.  - Difficulty tolerating tube feeds. - persists 06/15/14  -  - constipation resolved 06/14/14 after lactulose. In fact having diarrhea  P:   Resume tube feeds 10/16 >> keep at 20 ml/hr (hopefully clearance of constipation helps) Change protonix to h2 blockade due to diarrhea Continue lactulose but reduce dose due to diarrhea Continue xifaxan   HEMATOLOGIC  A:  Anemia from chronic GI bleeding >> stable at present. P:  F/u CBC Transfuse for hgb <7.0   INFECTIOUS  A:  Right > left PNA. HCAP vs aspiration.  Flu negative. MRSA  swab positive. P:  Day 7/7 of Abx,  cefepime - stopped 06/14/14 D/c vancomycin 10/16  ENDOCRINE  A:  DMII with nephropathy. CBG (last 3)   Recent Labs  06/14/14 2349 06/15/14 0520 06/15/14 0755  GLUCAP 122* 120* 116*     P:  SSI  NEUROLOGIC  A:  Acute encephalopathy 2nd to delirium tremens, respiratory failure.Possible associted hepatic encephalopathy +  With high Ammonia.  - 06/13/14: Started Lactulose and Xifaxan 06/13/14 (off diprivan 10/16)   - 06/15/14: encephalopathy continues.  . Either sedated or agitated  P:  RASS goal -2 Continuet versed gtt 10/19 with prn fentanyl and fent gtt Continue  LACTULOSE and XIFAXAN since  06/13/14 due to possible heaptic encephalopathy , follow ammonia level Continue thiamine, folic acid Foot drop boots  Family updated:  Parents updated at bedside 10/19  Interdisciplinary meeting (LOS 10 days) : held 06/14/14 at bedside with mom, dad, and sister and RN Amy: explained likely heading into prolonged mech vent state, chronic critical illness state and implications long term. Suggested meets indication for trach and need for LTACH. They seem inclined to move forward if does not wean off vent next few to several days   Today's summary: Check abg, may be able to wean O2 due to diuresis.Rcheck ammonia level in am. Sedate as needed.  Brett Canales Minor ACNP [facility] PCCM Pager [phone] till 3 pm If no answer page [phone] 06/15/2014, 10:26 AM    STAFF NOTE: I, Dr Lavinia Sharps have personally reviewed patient's available data, including medical history, events of note, physical examination and test results as part of my evaluation. I have discussed with resident/NP and other care providers such as pharmacist, RN and RRT.  In addition,  I personally evaluated patient and elicited key findings of  -a cute resp failure, acute encephalopathy, in setting of cirrhosis. Mom and Dad thinks encephalopathy might a bit better ("more focussed").  I will add  precedex to regimen. Given clearance of constipation: diteray to increase tube feeds..  Rest per NP/medical resident whose note is outlined above and that I agree with  The patient is critically ill with multiple organ systems failure and requires high complexity decision making for assessment and support, frequent evaluation and titration of therapies, application of advanced monitoring technologies and extensive interpretation of multiple databases.   Critical Care Time devoted to patient care services described in this note is  30  Minutes. This time reflects time of care of this signee Dr Kalman ShanMurali Zinedine Ellner. This critical care time does not reflect procedure time, or teaching time or supervisory time of PA/NP/Med student/Med Resident etc but could involve care discussion time    Dr. Kalman ShanMurali Azrielle Springsteen, M.D., [facility] Center[facility]C.C.P Pulmonary and Critical Care Medicine Staff Physician [facility] [place] [facility] Critical [facility] Pager: [phone], If no answer or between  15:00h - 7:00h: call [phone]  06/15/2014 11:15 AM

## 2014-06-15 NOTE — Plan of Care (Signed)
Problem: ICU Phase Progression Outcomes Goal: O2 sats trending toward baseline Outcome: Progressing Weaning 02 and peep per ARDS protocol.

## 2014-06-15 NOTE — Progress Notes (Signed)
NUTRITION FOLLOW UP  Intervention:    -Continue with Oxepa at trickle feeds of 20 ml/hr to provide 720 kcal (38% est kcal needs), and 30 gram protein (33% est protein needs) -As tolerated/improvement in loose stools:  -Increase Oxepa by 10 ml every 6 hours to goal rate of 50 ml/hr with Pro-Stat 30 ml once daily to provide 1900 kcal (100% est kcal needs), 90 gram protein (90% est protein needs) and 942 ml free water -Monitor phosphorus and replete as warranted -RD to follow  Nutrition Dx:   Inadequate oral intake related to inability to eat as evidenced by npo status; ongoing   Goal:   Diet advancement. Patient to meet estimated needs with meals and supplements.   New Goal: TF to meet >/= 90% of their estimated nutrition needs; not met with trickle feeds    Monitor:   TF tolerance, total protein/energy intake, labs, weights, diet order, sedation  Assessment:   10/12:  NPO on c pap. Currently being intubated.  Ate 1/2 omelet Saturday. Mother reports that patient has been afraid of eating for some time for fear of vomiting.  Patient last seen by RD 03/20/14. Diagnosed with severe malnutrition at that time due to weight loss and poor intake.  Weight has increased from 190 lbs in July to 208 lbs currently. Probably fluid gain.  Intake has been poor prior to admit.  Patient is currently intubated on ventilator support  MV: 11.6 L/min  Temp (24hrs), Avg:99 F (37.2 C), Min:98.3 F (36.8 C), Max:99.7 F (37.6 C)   Propofol: 20 ml/hr   10/13: Patient remains intubated on ventilator support MV: 11.2 L/min Temp (24hrs), Avg:99.6 F (37.6 C), Min:98.6 F (37 C), Max:100.4 F (38 C)  Propofol: 36.8 ml/hr  Vital AF 1.2 initiated at 20 ml/hr on 10/13 with goal rate of 60 ml/hr. RD to order adult enteral protocol.  MD noted pt increasingly agitated on ventilator. Propofol level increased, providing approximately 972 kcal/day. Low K levels being repleted. Weight increased 8 lbs since  admit, likely d/t fluid Pt s/p thoracentesis with 500 ml fluid removal  10/15: -Pt's tube feeding on hold d/t elevated gastric residuals.  -Vital AF 1.2 had been running at 25 ml/hr. On 10/14, residuals > 500. OGT clamped. Residuals 350 ml on 10/15, bile looking appearance per RN -Per discussion with RN, pt's tube feed to be held for 24 hours before restarting. Gastric residuals likely related to sedation, and bowel function will likely improve with sedation weaning. RN noted only minimal bowel sounds -Propofol was decreased earlier this morning to 19.8 ml/hr for WUA; and then was increased back to 36.8 ml/hr  -MD recommended f/u abd xray -Continue with current TF recommendation to be reinitiated per MD discretion, will monitor propofol levels/possible weaning and adjust as warranted -Total bili elevated, likely d/t cirrhosis of liver and hx of ETOH -Weight increased from 40 lb from admit, likely d/t fluid as pt with +2 generalized edema and +5 L fluid balance  10/16: -Received consult to initiate TF. Per discussion with RN, MD requesting trickle feeds at this time to assess tolerance, and increase to goal rate when able -Pharm noted pt with elevated triglycerides (> 400). Per RN, propofol is d/c and Versed will be used for sedation -Phosphorus slightly low at 2.1, K/Mg WNL  10/19: -Pt with elevated ammonia, lactulose ordered TID.  -Assisted in improving constipation; however resulted in ongoing loose stools. Plan to decrease lactulose and add H2 blockade -Oxepa to continue as 20 ml/hr  trickle feeds until loose stools episodes decrease. 60 ml residuals per RN  -Pt being diuresed, -2 L fluid balance, and only 4 lbs increased from admit weight. -Mg repleted, Phos/Mg/K all WNL    Height: Ht Readings from Last 1 Encounters:  06/10/14 5' 10" (1.778 m)    Weight Status:   Wt Readings from Last 1 Encounters:  06/15/14 212 lb 1.3 oz (96.2 kg)  06/05/14 208 lb (94.5 kg)  Re-estimated  needs:  Kcal: 1883 Protein: 100-115 gram  Fluid: 2.4L  Skin: WDL  Diet Order: NPO   Intake/Output Summary (Last 24 hours) at 06/15/14 1414 Last data filed at 06/15/14 1400  Gross per 24 hour  Intake 1344.32 ml  Output   3350 ml  Net -2005.68 ml    Last BM: 10/19   Labs:   Recent Labs Lab 06/12/14 0545 06/13/14 0530 06/14/14 0453 06/15/14 0455  NA 140 144 147 148*  K 4.4 3.9 4.1 4.1  CL 112 115* 116* 116*  CO2 13* 16* 20 21  BUN _0 CREATININE 1.14 1.16 1.17 1.28  CALCIUM 7.6* 8.1* 8.6 8.6  MG 2.1  --  1.7 1.7  PHOS 2.1*  --  2.9 3.0  GLUCOSE 119* 112* 116* 117*    CBG (last 3)   Recent Labs  06/15/14 0520 06/15/14 0755 06/15/14 1156  GLUCAP 120* 116* 103*    Scheduled Meds: . antiseptic oral rinse  7 mL Mouth Rinse QID  . chlorhexidine  15 mL Mouth Rinse BID  . feeding supplement (OXEPA)  1,000 mL Per Tube Q24H  . folic acid  1 mg Intravenous Daily  . furosemide  40 mg Intravenous Q12H  . insulin aspart  0-9 Units Subcutaneous 6 times per day  . lactulose  10 g Per Tube TID  . potassium chloride  20 mEq Per Tube BID  . ranitidine  150 mg Oral QHS  . rifaximin  400 mg Oral TID  . sodium chloride  3 mL Intravenous Q12H  . thiamine IV  100 mg Intravenous Daily    Continuous Infusions: . sodium chloride 10 mL/hr at 06/15/14 1311  . dexmedetomidine 0.4 mcg/kg/hr (06/15/14 1400)  . fentaNYL infusion INTRAVENOUS 200 mcg/hr (06/15/14 1300)  . midazolam (VERSED) infusion 2 mg/hr (06/15/14 1400)  . phenylephrine (NEO-SYNEPHRINE) Adult infusion Stopped (06/12/14 1807)    Atlee Abide MS RD LDN Clinical Dietitian [phone]

## 2014-06-16 ENCOUNTER — Inpatient Hospital Stay (HOSPITAL_COMMUNITY): Payer: BC Managed Care – PPO

## 2014-06-16 LAB — BASIC METABOLIC PANEL
ANION GAP: 11 (ref 5–15)
BUN: 19 mg/dL (ref 6–23)
CHLORIDE: 119 meq/L — AB (ref 96–112)
CO2: 22 meq/L (ref 19–32)
Calcium: 8.8 mg/dL (ref 8.4–10.5)
Creatinine, Ser: 1.06 mg/dL (ref 0.50–1.35)
GFR calc Af Amer: >90 mL/min (ref >90)
GFR calc non Af Amer: 89 mL/min — ABNORMAL LOW (ref >90)
Glucose, Bld: 132 mg/dL — ABNORMAL HIGH (ref 70–99)
Potassium: 4.4 mEq/L (ref 3.7–5.3)
Sodium: 152 mEq/L — ABNORMAL HIGH (ref 137–147)

## 2014-06-16 LAB — PHOSPHORUS: PHOSPHORUS: 3.9 mg/dL (ref 2.3–4.6)

## 2014-06-16 LAB — CBC WITH DIFFERENTIAL/PLATELET
BASOS ABS: 0.2 10*3/uL — AB (ref 0.0–0.1)
Basophils Relative: 2 % — ABNORMAL HIGH (ref 0–1)
EOS PCT: 5 % (ref 0–5)
Eosinophils Absolute: 0.5 10*3/uL (ref 0.0–0.7)
HEMATOCRIT: 26.5 % — AB (ref 39.0–52.0)
HEMOGLOBIN: 8 g/dL — AB (ref 13.0–17.0)
LYMPHS PCT: 19 % (ref 12–46)
Lymphs Abs: 1.8 10*3/uL (ref 0.7–4.0)
MCH: 24.7 pg — ABNORMAL LOW (ref 26.0–34.0)
MCHC: 30.2 g/dL (ref 30.0–36.0)
MCV: 81.8 fL (ref 78.0–100.0)
MONOS PCT: 9 % (ref 3–12)
Monocytes Absolute: 0.8 10*3/uL (ref 0.1–1.0)
NEUTROS ABS: 6.1 10*3/uL (ref 1.7–7.7)
Neutrophils Relative %: 65 % (ref 43–77)
Platelets: 192 10*3/uL (ref 150–400)
RBC: 3.24 MIL/uL — AB (ref 4.22–5.81)
RDW: 21 % — AB (ref 11.5–15.5)
WBC: 9.4 10*3/uL (ref 4.0–10.5)

## 2014-06-16 LAB — GLUCOSE, CAPILLARY
GLUCOSE-CAPILLARY: 123 mg/dL — AB (ref 70–99)
GLUCOSE-CAPILLARY: 130 mg/dL — AB (ref 70–99)
GLUCOSE-CAPILLARY: 133 mg/dL — AB (ref 70–99)
Glucose-Capillary: 118 mg/dL — ABNORMAL HIGH (ref 70–99)
Glucose-Capillary: 140 mg/dL — ABNORMAL HIGH (ref 70–99)

## 2014-06-16 LAB — HEPATIC FUNCTION PANEL
ALK PHOS: 176 U/L — AB (ref 39–117)
ALT: 15 U/L (ref 0–53)
AST: 88 U/L — ABNORMAL HIGH (ref 0–37)
Albumin: 2.3 g/dL — ABNORMAL LOW (ref 3.5–5.2)
BILIRUBIN DIRECT: 1.6 mg/dL — AB (ref 0.0–0.3)
BILIRUBIN INDIRECT: 0.8 mg/dL (ref 0.3–0.9)
Total Bilirubin: 2.4 mg/dL — ABNORMAL HIGH (ref 0.3–1.2)
Total Protein: 7.1 g/dL (ref 6.0–8.3)

## 2014-06-16 LAB — MAGNESIUM: Magnesium: 2 mg/dL (ref 1.5–2.5)

## 2014-06-16 LAB — AMMONIA: Ammonia: 65 umol/L — ABNORMAL HIGH (ref 11–60)

## 2014-06-16 MED ORDER — CLONAZEPAM 0.5 MG PO TBDP
1.0000 mg | ORAL_TABLET | Freq: Two times a day (BID) | ORAL | Status: DC
  Administered 2014-06-16 – 2014-06-19 (×7): 1 mg via ORAL
  Filled 2014-06-16 (×7): qty 2

## 2014-06-16 MED ORDER — DEXTROSE 5 % IV SOLN
INTRAVENOUS | Status: DC
  Administered 2014-06-16: 11:00:00 via INTRAVENOUS
  Administered 2014-06-17: 50 mL via INTRAVENOUS

## 2014-06-16 MED ORDER — OXEPA PO LIQD
1000.0000 mL | ORAL | Status: DC
  Administered 2014-06-16 – 2014-06-18 (×3): 1000 mL
  Filled 2014-06-16 (×4): qty 1000

## 2014-06-16 MED ORDER — FREE WATER
200.0000 mL | Freq: Three times a day (TID) | Status: DC
  Administered 2014-06-16 – 2014-06-17 (×4): 200 mL

## 2014-06-16 MED ORDER — CLONAZEPAM 0.1 MG/ML ORAL SUSPENSION
1.0000 mg | Freq: Two times a day (BID) | ORAL | Status: DC
  Filled 2014-06-16 (×2): qty 10

## 2014-06-16 MED ORDER — FUROSEMIDE 10 MG/ML IJ SOLN
40.0000 mg | Freq: Every day | INTRAMUSCULAR | Status: DC
  Administered 2014-06-16: 40 mg via INTRAVENOUS
  Filled 2014-06-16: qty 4

## 2014-06-16 NOTE — Progress Notes (Signed)
Called from another patient's room by NT for help.  Upon walking by the patient's room, she observed the patient on the floor on his hands and knees, with the breathing tube pulled out, mittens still on patient's hands.  Upon my entering patient's room, patient was not on heart monitor, on his hands and knees, with ET tube out.  Patient spontaneously breathing.  Assisted patient onto back with many other team members, and patient was lifted back to bed with assistance of a lift sheet underneath him.  Patient not responsive, eyes open,  Placed back on heart monitor and pulse oximeter, and patient emergently re-intubated by NP with MD present.  Will continue to monitor.

## 2014-06-16 NOTE — Procedures (Signed)
Intubation Procedure Note Jerome BodyWilliam [name] [id number] 10/18/1975  Procedure: Intubation Indications: Airway protection and maintenance  Procedure Details Consent: Unable to obtain consent because of emergent medical necessity. Time Out: Verified patient identification, verified procedure, site/side was marked, verified correct patient position, special equipment/implants available, medications/allergies/relevent history reviewed, required imaging and test results available.  Performed  MAC and 3 Medications:  Fentanyl 100 mcg Etomidate 20 mg Versed 2mg  NMB Zemuron 75 mg   Evaluation Hemodynamic Status: BP stable throughout; O2 sats: stable throughout Patient's Current Condition: stable Complications: No apparent complications Patient did tolerate procedure well. Chest X-ray ordered to verify placement.  CXR: pending. Self extubated and re intubated for acute resp failure.   Jerome [name] ACNP Jerome [name] PCCM Pager [phone] till 3 pm If no answer page [phone] 06/16/2014, 12:06 PM

## 2014-06-16 NOTE — Procedures (Signed)
Staff note  - supervised intubaton by NP at bedside  Dr. Kalman ShanMurali Genean Adamski, M.D., [facility].C.C.P Pulmonary and Critical Care Medicine Staff Physician [facility] [place] Pulmonary and Critical Care Pager: [phone], If no answer or between  15:00h - 7:00h: call [phone]  06/16/2014 12:28 PM

## 2014-06-16 NOTE — Progress Notes (Signed)
PULMONARY / CRITICAL CARE MEDICINE  Name: Jerome [name] MRN: [id number] DOB:  1976-03-03 ADMISSION DATE: 06/05/2014  CONSULTATION DATE: 06/08/2014  REFERRING MD : TRH  CHIEF COMPLAINT: Respiratory Failure  INITIAL PRESENTATION:  38 yo male with hx of ETOH presented with dyspnea/fatigue from profound anemia (Hb 4.5), and received multiple transfusions of PRBC.  Developed progressive respiratory failure with Rt > Lt pulmonary infiltrates, and PCCM assumed care in ICU.  SIGNIFICANT EVENTS:  10/09 admitted; PRBC x3, GI consulted 10/10 PRBC x1 10/11 PRBC x1; new onset PNA on chest x ray 10/12: acute hypoxic respiratory failure overnight requiring BiPAP. Alcohol withdrawal requiring precedex gtt. Intubated for decreasing LOC and increasing 02 needs and worsening PNA on CXR.  10/13 bronchoscopy >> respiratory secretions from RLL, friable airways  - 10/13 Rt thoracentesis > 500 ml fluid: transudate  10/14 gastric residuals  - 10/14 Echo >> EF 55 to 60%, PAS 33 mmHg 10/16 acidosis ?from diprivan >> change to versed gtt  06/13/14:  Significant intermittent agitation.  On 45% fio2.hick tenacious ET tube secretions + .  on fent and versed 6mg /h gtt. START LACTULOSE AND XIFAXAN 10/19 fio2 50 % and peep 12 on ARDS protocol 10/20 fio2 40% 8 peep. Slow progress 10/20 self extubation with urgent reintubation  Intake/Output Summary (Last 24 hours) at 06/16/14 0942 Last data filed at 06/16/14 0800  Gross per 24 hour  Intake 1354.43 ml  Output   4000 ml  Net -2645.57 ml    SUBJECTIVE/OVERNIGHT/INTERVAL HX 10/20negative I/O with Lasix, c x r looks slightly  better. Agitated or sedated.  PHYSICAL EXAM:  Vital signs in last 24 hours: Temp:  [98.4 F (36.9 C)-99.9 F (37.7 C)] 98.4 F (36.9 C) (10/20 0800) Pulse Rate:  [67-121] 67 (10/20 0800) Resp:  [12-24] 16 (10/20 0800) BP: (83-153)/(40-79) 108/62 mmHg (10/20 0800) SpO2:  [89 %-96 %] 96 % (10/20 0800) FiO2 (%):  [50 %-60 %] 50 %  (10/20 0800) Last BM Date: 06/15/14 VENT SETTINGS: Vent Mode:  [-] PRVC FiO2 (%):  [50 %-60 %] 50 % Set Rate:  [16 bmp] 16 bmp Vt Set:  [580 mL] 580 mL PEEP:  [8 cmH20] 8 cmH20 Plateau Pressure:  [17 cmH20-23 cmH20] 20 cmH20 I/O: Intake/Output     10/19 0701 - 10/20 0700 10/20 0701 - 10/21 0700   I.V. (mL/kg) 1056.2 (11) 68.2 (0.7)   NG/GT 240 20   IV Piggyback     Total Intake(mL/kg) 1296.2 (13.5) 88.2 (0.9)   Urine (mL/kg/hr) 3400 (1.5) 100 (0.4)   Stool 750 (0.3)    Total Output 4150 100   Net -2853.8 -11.8          PHYSICAL EXAMINATION:  General: no distress, heavily sedated but agitated when sedation decreased. Neuro: RASS -2 and fluctutates to +3 when sedation decreased HEENT: ETT in place, no jvd/LAN Cardiovascular: regular HSR RRR Lungs: scattered rhonchi, decreased in bases Abdomen: Soft, non-tender, + bowel sounds, TF's running Musculoskeletal: no edema Skin: warm/dry  Lab Results: PULMONARY  Recent Labs Lab 06/10/14 1103 06/11/14 0505 06/12/14 0400 06/14/14 1202 06/15/14 1106  PHART 7.351 7.362 7.372 7.420 7.376  PCO2ART 30.8* 27.5* 26.1* 32.3* 39.3  PO2ART 71.0* 69.5* 70.0* 100.0 132.0*  HCO3 16.6* 15.2* 14.8* 20.6 22.5  TCO2 15.9 14.6 14.1 19.4 21.2  O2SAT 94.6 93.4 93.8 98.1 99.3    CBC  Recent Labs Lab 06/14/14 0453 06/15/14 0455 06/16/14 0530  HGB 8.5* 8.0* 8.0*  HCT 28.2* 26.5* 26.5*  WBC 11.3* 10.8* 9.4  PLT 204 201 192    COAGULATION  Recent Labs Lab 06/14/14 0453  INR 1.40    CARDIAC  No results found for this basename: TROPONINI,  in the last 168 hours No results found for this basename: PROBNP,  in the last 168 hours   CHEMISTRY  Recent Labs Lab 06/09/14 1048  06/11/14 0345 06/12/14 0545 06/13/14 0530 06/14/14 0453 06/15/14 0455 06/16/14 0530  NA  --   < > 140 140 144 147 148* 152*  K 3.5*  < > 3.8 4.4 3.9 4.1 4.1 4.4  CL  --   < > 111 112 115* 116* 116* 119*  CO2  --   < > 13* 13* 16* 20 21 22   GLUCOSE   --   < > 113* 119* 112* 116* 117* 132*  BUN  --   < > 9 9 9 11 15 19   CREATININE  --   < > 1.09 1.14 1.16 1.17 1.28 1.06  CALCIUM  --   < > 7.2* 7.6* 8.1* 8.6 8.6 8.8  MG 1.5  --   --  2.1  --  1.7 1.7 2.0  PHOS  --   --   --  2.1*  --  2.9 3.0 3.9  < > = values in this interval not displayed. Estimated Creatinine Clearance: 112.1 ml/min (by C-G formula based on Cr of 1.06).   LIVER  Recent Labs Lab 06/11/14 0345 06/12/14 0545 06/14/14 0453  AST 78* 83*  --   ALT 15 16  --   ALKPHOS 170* 170*  --   BILITOT 2.3* 2.0*  --   PROT 6.0 6.3  --   ALBUMIN 2.1* 2.1*  --   INR  --   --  1.40     INFECTIOUS No results found for this basename: LATICACIDVEN, PROCALCITON,  in the last 168 hours   ENDOCRINE CBG (last 3)   Recent Labs  06/16/14 0023 06/16/14 0448 06/16/14 0813  GLUCAP 133* 130* 118*         IMAGING x48h Dg Chest Port 1 View  06/16/2014   CLINICAL DATA:  Hypoxia, personal history diabetes mellitus and hypertension  EXAM: PORTABLE CHEST - 1 VIEW  COMPARISON:  Portable exam 0453 hr compared to 06/15/2014  FINDINGS: Tip of endotracheal tube projects 5.8 cm above carina.  Tip of LEFT jugular central venous catheter projects over SVC.  Nasogastric tube extends into abdomen.  Enlargement of cardiac silhouette with pulmonary vascular congestion.  Bibasilar atelectasis.  Slightly improved pulmonary edema.  No gross pleural effusion or pneumothorax.  IMPRESSION: Enlargement of cardiac silhouette with pulmonary vascular congestion and slightly improved pulmonary edema.  Mild bibasilar atelectasis.   Electronically Signed   By: Ulyses SouthwardMark  Boles M.D.   On: 06/16/2014 07:46   Dg Chest Port 1 View  06/15/2014   CLINICAL DATA:  Respiratory failure, endotracheal tube positioning. Subsequent encounter.  EXAM: PORTABLE CHEST - 1 VIEW  COMPARISON:  06/14/2014; 06/13/2014; 06/12/2014  FINDINGS: Grossly unchanged enlarged cardiac silhouette and mediastinal contours given reduced lung  volumes and patient rotation. Stable position of support apparatus. Pulmonary vasculature remains indistinct with cephalization of flow. Grossly unchanged small layering left-sided effusion with associated slight worsening of left mid and lower lung opacities. Unchanged bones.  IMPRESSION: 1.  Stable positioning of support apparatus.  No pneumothorax. 2. Grossly unchanged findings of mild pulmonary edema and small layering left-sided pleural effusion.   Electronically Signed   By: Simonne ComeJohn  Watts M.D.   On:  06/15/2014 07:45       ASSESSMENT / PLAN:  PULMONARY  OETT 10/12 >>10/20 self re intubated 10/20>> A:  Acute hypoxic respiratory failure 2nd to PNA, and b/l pleural effusions (transudate on Rt).   - does not meet sbt criteria due to agitation and severe hypoxemia/ALI -better with diuresis  P:  Wean PEEP/FiO2 per ARDS protocol F/u CXR SBT as tolerated Diuresis as tolerated(see renal)   CARDIOVASCULAR  A:  Hypotension 2nd to sedation meds.(resolved off pressors) Hypervolemia.   P:  Lasix 40 mg IV q12h on 10/16, change to QD 10/20; start schedule 10/17 Pressors to keep SBP > 90, MAP > 65  RENAL  Lab Results  Component Value Date   CREATININE 1.06 06/16/2014   CREATININE 1.28 06/15/2014   CREATININE 1.17 06/14/2014    Recent Labs Lab 06/14/14 0453 06/15/14 0455 06/16/14 0530  NA 147 148* 152*    A:  Stage III CKD. Non gap metabolic acidosis ?from diprivan. 06/12/14  Hypernatremia    P:  Monitor renal fx, urine outpt F/u ABG, BMET Free h2o started 10/20 IVF changed to D5W 10/20 Lasix decreased to daily 10/20. Goal is negative i/o with Na decrease.   GASTROINTESTINAL  A:  Severe protein calorie malnutrition.  ETOH with cirrhosis.  - Difficulty tolerating tube feeds. - persists 06/16/14  -  - constipation resolved 06/14/14 after lactulose. In fact having diarrhea  P:   Resume tube feeds 10/16 >> increase at 30 ml/h on 10/20 Change protonix to h2 blockade  due to diarrhea Continue lactulose but reduce dose due to diarrhea Continue xifaxan   HEMATOLOGIC  A:  Anemia from chronic GI bleeding >> stable at present. P:  F/u CBC Transfuse for hgb <7.0   INFECTIOUS  A:  Right > left PNA. HCAP vs aspiration.  Flu negative. MRSA swab positive. P:  Day 7/7 of Abx,  cefepime - stopped 06/14/14 D/c vancomycin 10/16  ENDOCRINE  A:  DMII with nephropathy. CBG (last 3)   Recent Labs  06/16/14 0023 06/16/14 0448 06/16/14 0813  GLUCAP 133* 130* 118*     P:  SSI  NEUROLOGIC  A:  Acute encephalopathy 2nd to delirium tremens, respiratory failure.Possible associted hepatic encephalopathy +  With high Ammonia.  - 06/13/14: Started Lactulose and Xifaxan 06/13/14 (off diprivan 10/16)   - 06/15/14: encephalopathy continues.  . Either sedated or agitated  P:  RASS goal -2 Continuet versed gtt 10/19 with prn fentanyl and fent gtt Continue  LACTULOSE and XIFAXAN since  06/13/14 due to possible heaptic encephalopathy , follow ammonia level decreased 10/20 Continue thiamine, folic acid Foot drop boots Start low dose klonopin/seroquel po to hopefully decrease IV sedation and allow smoother weaning of sedation. 10/20 Start weaning versed in am 10/21 as tolerated. Family updated:  Parents updated at bedside 10/20  Interdisciplinary meeting (LOS 11 days) : held 06/14/14 at bedside with mom, dad, and sister and RN Amy: explained likely heading into prolonged mech vent state, chronic critical illness state and implications long term. Suggested meets indication for trach and need for LTACH. They seem inclined to move forward if does not wean off vent next few to several days   Today's summary: Wean as tolerated. Mental status large blocking factor to wean. Correct hypernatremia but continue diuresis. Self extubated despite high level of sedation.  Brett Canales Minor ACNP Adolph Pollack PCCM Pager [phone] till 3 pm If no answer page [phone] 06/16/2014,  9:42 AM    STAFF NOTE: I, Dr Lavinia Sharps  have personally reviewed patient's available data, including medical history, events of note, physical examination and test results as part of my evaluation. I have discussed with resident/NP and other care providers such as pharmacist, RN and RRT.  In addition,  I personally evaluated patient and elicited key findings of  Acute resp failure anda acute enceph. No improvement. In fact he fell down on floor and self extubated. He needs trach; I have told parents as such  Rest per NP/medical resident whose note is outlined above and that I agree with  The patient is critically ill with multiple organ systems failure and requires high complexity decision making for assessment and support, frequent evaluation and titration of therapies, application of advanced monitoring technologies and extensive interpretation of multiple databases.   Critical Care Time devoted to patient care services described in this note is  30  Minutes. This time reflects time of care of this signee Dr Khala Tarte. This critical care time does not reflect proKalman Shancedure time, or teaching time or supervisory time of PA/NP/Med student/Med Resident etc but could involve care discussion time    Dr. Kalman ShanMurali Klarissa Mcilvain, M.D., [facility] [place]F.[facility].C.P Pulmonary and Critical Care Medicine Staff Physician [facility] Pager: [phone], If no answer or between  15:00h - 7:00h: call [phone]  06/16/2014 12:27 PM

## 2014-06-16 NOTE — Progress Notes (Signed)
06/16/14 1200  What Happened  Was fall witnessed? No  Was patient injured? Unsure  Patient found on floor  Found by Staff-comment  Stated prior activity other (comment) (pt sedated on ventilator)  Follow Up  MD notified Dr. Marchelle Gearingamaswamy  Time MD notified 1200  Family notified Yes-comment  Time family notified 1215  Additional tests Yes-comment (head CT )  Adult Fall Risk Assessment  Risk Factor Category (scoring not indicated) Fall has occurred during this admission (document High fall risk)  Age 38  Fall History: Fall within 6 months prior to admission 5  Elimination; Bowel and/or Urine Incontinence 2  Elimination; Bowel and/or Urine Urgency/Frequency 2  Medications: includes PCA/Opiates, Anti-convulsants, Anti-hypertensives, Diuretics, Hypnotics, Laxatives, Sedatives, and Psychotropics 7  Patient Care Equipment 3  Mobility-Assistance 2  Mobility-Gait 2  Mobility-Sensory Deficit 2  Cognition-Awareness 1  Cognition-Impulsiveness 2  Cognition-Limitations 4  Total Score 32  Patient's Fall Risk High Fall Risk (>13 points)  Adult Fall Risk Interventions  Required Bundle Interventions *See Row Information* High fall risk - low, moderate, and high requirements implemented  Additional Interventions 24 hour supervision/sitter  Vitals  Temp 97.7 F (36.5 C)  Temp Source Core  BP ! 112/50 mmHg  MAP (mmHg) 68  BP Location Left arm  BP Method Automatic  Patient Position (if appropriate) Lying  Pulse Rate 91  ECG Heart Rate 91  Resp 20  Oxygen Therapy  SpO2 96 %  O2 Device Ventilator  FiO2 (%) 100 %  Pulse Oximetry Type Continuous  Pain Assessment  Pain Assessment CPOT  Pain Score 0  Critical Care Pain Observation Tool (CPOT)  Facial Expression 0  Body Movements 0  Muscle Tension 0  Compliance with ventilator (intubated pts.) 0  Vocalization (extubated pts.) N/A  CPOT Total 0  PCA/Epidural/Spinal Assessment  Respiratory Pattern Regular;Unlabored  Neurological  Neuro  (WDL) X  Level of Consciousness (sedated for reintubation )  Orientation Level Intubated/Tracheostomy - Unable to assess  Cognition Unable to follow commands  Speech Intubated/Tracheostomy - Unable to assess

## 2014-06-16 NOTE — Plan of Care (Signed)
Problem: Consults Goal: Respiratory Problems Patient Education See Patient Education Module for education specifics.  Outcome: Progressing Education provided by MD with family regarding possible trach placement.

## 2014-06-17 ENCOUNTER — Inpatient Hospital Stay (HOSPITAL_COMMUNITY): Payer: BC Managed Care – PPO

## 2014-06-17 LAB — BLOOD GAS, ARTERIAL
Acid-base deficit: 5.1 mmol/L — ABNORMAL HIGH (ref 0.0–2.0)
BICARBONATE: 19 meq/L — AB (ref 20.0–24.0)
Drawn by: 103701
FIO2: 0.4 %
O2 Saturation: 93.8 %
PATIENT TEMPERATURE: 99
PCO2 ART: 34.2 mmHg — AB (ref 35.0–45.0)
PEEP: 8 cmH2O
PH ART: 7.366 (ref 7.350–7.450)
RATE: 16 resp/min
TCO2: 18.2 mmol/L (ref 0–100)
VT: 580 mL
pO2, Arterial: 72.3 mmHg — ABNORMAL LOW (ref 80.0–100.0)

## 2014-06-17 LAB — GLUCOSE, CAPILLARY
GLUCOSE-CAPILLARY: 136 mg/dL — AB (ref 70–99)
Glucose-Capillary: 131 mg/dL — ABNORMAL HIGH (ref 70–99)
Glucose-Capillary: 133 mg/dL — ABNORMAL HIGH (ref 70–99)
Glucose-Capillary: 137 mg/dL — ABNORMAL HIGH (ref 70–99)
Glucose-Capillary: 159 mg/dL — ABNORMAL HIGH (ref 70–99)
Glucose-Capillary: 128 mg/dL — ABNORMAL HIGH (ref 70–99)

## 2014-06-17 LAB — BASIC METABOLIC PANEL
ANION GAP: 10 (ref 5–15)
BUN: 21 mg/dL (ref 6–23)
CALCIUM: 8.6 mg/dL (ref 8.4–10.5)
CO2: 21 mEq/L (ref 19–32)
Chloride: 116 mEq/L — ABNORMAL HIGH (ref 96–112)
Creatinine, Ser: 1 mg/dL (ref 0.50–1.35)
GFR calc Af Amer: >90 mL/min (ref >90)
GFR calc non Af Amer: >90 mL/min (ref >90)
GLUCOSE: 147 mg/dL — AB (ref 70–99)
Potassium: 3.9 mEq/L (ref 3.7–5.3)
SODIUM: 147 meq/L (ref 137–147)

## 2014-06-17 LAB — MAGNESIUM: MAGNESIUM: 1.8 mg/dL (ref 1.5–2.5)

## 2014-06-17 LAB — CBC WITH DIFFERENTIAL/PLATELET
Basophils Absolute: 0.3 10*3/uL — ABNORMAL HIGH (ref 0.0–0.1)
Basophils Relative: 3 % — ABNORMAL HIGH (ref 0–1)
Eosinophils Absolute: 0.5 10*3/uL (ref 0.0–0.7)
Eosinophils Relative: 5 % (ref 0–5)
HCT: 27 % — ABNORMAL LOW (ref 39.0–52.0)
Hemoglobin: 8.1 g/dL — ABNORMAL LOW (ref 13.0–17.0)
Lymphocytes Relative: 18 % (ref 12–46)
Lymphs Abs: 1.9 10*3/uL (ref 0.7–4.0)
MCH: 24.7 pg — AB (ref 26.0–34.0)
MCHC: 30 g/dL (ref 30.0–36.0)
MCV: 82.3 fL (ref 78.0–100.0)
MONO ABS: 0.9 10*3/uL (ref 0.1–1.0)
Monocytes Relative: 8 % (ref 3–12)
NEUTROS PCT: 66 % (ref 43–77)
Neutro Abs: 7.1 10*3/uL (ref 1.7–7.7)
PLATELETS: 228 10*3/uL (ref 150–400)
RBC: 3.28 MIL/uL — ABNORMAL LOW (ref 4.22–5.81)
RDW: 21.2 % — ABNORMAL HIGH (ref 11.5–15.5)
WBC: 10.7 10*3/uL — ABNORMAL HIGH (ref 4.0–10.5)

## 2014-06-17 LAB — PHOSPHORUS: Phosphorus: 3.6 mg/dL (ref 2.3–4.6)

## 2014-06-17 LAB — AMMONIA: AMMONIA: 75 umol/L — AB (ref 11–60)

## 2014-06-17 MED ORDER — MIDAZOLAM HCL 2 MG/2ML IJ SOLN
4.0000 mg | Freq: Once | INTRAMUSCULAR | Status: AC
  Administered 2014-06-18: 3 mg via INTRAVENOUS

## 2014-06-17 MED ORDER — CLONIDINE HCL 0.1 MG PO TABS
0.1000 mg | ORAL_TABLET | Freq: Three times a day (TID) | ORAL | Status: DC
  Administered 2014-06-17 – 2014-06-20 (×11): 0.1 mg via ORAL
  Filled 2014-06-17 (×11): qty 1

## 2014-06-17 MED ORDER — FENTANYL CITRATE 0.05 MG/ML IJ SOLN
200.0000 ug | Freq: Once | INTRAMUSCULAR | Status: AC
  Administered 2014-06-18: 100 ug via INTRAVENOUS

## 2014-06-17 MED ORDER — SODIUM CHLORIDE 0.9 % IV SOLN
INTRAVENOUS | Status: DC
  Administered 2014-06-17: 08:00:00 via INTRAVENOUS
  Administered 2014-06-23 – 2014-06-28 (×2): 10 mL/h via INTRAVENOUS

## 2014-06-17 MED ORDER — LACTULOSE 10 GM/15ML PO SOLN
20.0000 g | Freq: Three times a day (TID) | ORAL | Status: DC
  Administered 2014-06-17 – 2014-06-19 (×7): 20 g
  Filled 2014-06-17 (×8): qty 30

## 2014-06-17 MED ORDER — MAGNESIUM SULFATE 40 MG/ML IJ SOLN
2.0000 g | Freq: Once | INTRAMUSCULAR | Status: AC
  Administered 2014-06-17: 2 g via INTRAVENOUS
  Filled 2014-06-17: qty 50

## 2014-06-17 MED ORDER — PROPOFOL 10 MG/ML IV EMUL: 5.0000 ug/kg/min | Freq: Once | INTRAVENOUS | Status: DC

## 2014-06-17 MED ORDER — QUETIAPINE FUMARATE 50 MG PO TABS
25.0000 mg | ORAL_TABLET | Freq: Every day | ORAL | Status: DC
  Administered 2014-06-17 – 2014-06-18 (×2): 25 mg via ORAL
  Filled 2014-06-17 (×2): qty 1

## 2014-06-17 MED ORDER — FUROSEMIDE 10 MG/ML IJ SOLN
40.0000 mg | Freq: Two times a day (BID) | INTRAMUSCULAR | Status: DC
  Administered 2014-06-17 (×2): 40 mg via INTRAVENOUS
  Filled 2014-06-17 (×2): qty 4

## 2014-06-17 MED ORDER — DEXMEDETOMIDINE HCL IN NACL 400 MCG/100ML IV SOLN
0.0000 ug/kg/h | INTRAVENOUS | Status: DC
  Administered 2014-06-17 – 2014-06-20 (×20): 1.2 ug/kg/h via INTRAVENOUS
  Administered 2014-06-20: 1.8 ug/kg/h via INTRAVENOUS
  Filled 2014-06-17: qty 200
  Filled 2014-06-17 (×24): qty 100

## 2014-06-17 MED ORDER — HALOPERIDOL LACTATE 5 MG/ML IJ SOLN
INTRAMUSCULAR | Status: AC
  Filled 2014-06-17: qty 1

## 2014-06-17 MED ORDER — VECURONIUM BROMIDE 10 MG IV SOLR
10.0000 mg | Freq: Once | INTRAVENOUS | Status: DC
Start: 2014-06-17 — End: 2014-06-18

## 2014-06-17 MED ORDER — ETOMIDATE 2 MG/ML IV SOLN
40.0000 mg | Freq: Once | INTRAVENOUS | Status: DC
  Filled 2014-06-17: qty 20

## 2014-06-17 MED ORDER — HALOPERIDOL LACTATE 5 MG/ML IJ SOLN
10.0000 mg | Freq: Once | INTRAMUSCULAR | Status: AC
  Administered 2014-06-17: 10 mg via INTRAVENOUS

## 2014-06-17 MED ORDER — PRO-STAT SUGAR FREE PO LIQD
30.0000 mL | Freq: Every morning | ORAL | Status: DC
  Administered 2014-06-17 – 2014-06-19 (×2): 30 mL
  Filled 2014-06-17 (×4): qty 30

## 2014-06-17 NOTE — Progress Notes (Signed)
NUTRITION FOLLOW UP  Intervention:    -Consider advancing Oxepa to 40 ml/hr to provide 1440 kcal (38% est kcal needs), and 60 gram protein (60% est protein needs) -Recommend Pro-Stat 30 ml once daily -As tolerated/improvement in loose stools:  -Increase Oxepa by 10 ml every 6 hours to goal rate of 50 ml/hr with Pro-Stat 30 ml once daily -Monitor phosphorus and replete as warranted -RD to follow  Nutrition Dx:   Inadequate oral intake related to inability to eat as evidenced by npo status; ongoing   Goal:   Diet advancement. Patient to meet estimated needs with meals and supplements.   New Goal: TF to meet >/= 90% of their estimated nutrition needs; not met with trickle feeds    Monitor:   TF tolerance, total protein/energy intake, labs, weights, diet order, sedation  Assessment:   10/12:  NPO on c pap. Currently being intubated.  Ate 1/2 omelet Saturday. Mother reports that patient has been afraid of eating for some time for fear of vomiting.  Patient last seen by RD 03/20/14. Diagnosed with severe malnutrition at that time due to weight loss and poor intake.  Weight has increased from 190 lbs in July to 208 lbs currently. Probably fluid gain.  Intake has been poor prior to admit.  Patient is currently intubated on ventilator support  MV: 11.6 L/min  Temp (24hrs), Avg:99 F (37.2 C), Min:98.3 F (36.8 C), Max:99.7 F (37.6 C)   Propofol: 20 ml/hr   10/13: Patient remains intubated on ventilator support MV: 11.2 L/min Temp (24hrs), Avg:98 F (36.7 C), Min:97 F (36.1 C), Max:99.3 F (37.4 C)  Propofol: 36.8 ml/hr  Vital AF 1.2 initiated at 20 ml/hr on 10/13 with goal rate of 60 ml/hr. RD to order adult enteral protocol.  MD noted pt increasingly agitated on ventilator. Propofol level increased, providing approximately 972 kcal/day. Low K levels being repleted. Weight increased 8 lbs since admit, likely d/t fluid Pt s/p thoracentesis with 500 ml fluid  removal  10/15: -Pt's tube feeding on hold d/t elevated gastric residuals.  -Vital AF 1.2 had been running at 25 ml/hr. On 10/14, residuals > 500. OGT clamped. Residuals 350 ml on 10/15, bile looking appearance per RN -Per discussion with RN, pt's tube feed to be held for 24 hours before restarting. Gastric residuals likely related to sedation, and bowel function will likely improve with sedation weaning. RN noted only minimal bowel sounds -Propofol was decreased earlier this morning to 19.8 ml/hr for WUA; and then was increased back to 36.8 ml/hr  -MD recommended f/u abd xray -Continue with current TF recommendation to be reinitiated per MD discretion, will monitor propofol levels/possible weaning and adjust as warranted -Total bili elevated, likely d/t cirrhosis of liver and hx of ETOH -Weight increased from 40 lb from admit, likely d/t fluid as pt with +2 generalized edema and +5 L fluid balance  10/16: -Received consult to initiate TF. Per discussion with RN, MD requesting trickle feeds at this time to assess tolerance, and increase to goal rate when able -Pharm noted pt with elevated triglycerides (> 400). Per RN, propofol is d/c and Versed will be used for sedation -Phosphorus slightly low at 2.1, K/Mg WNL  10/19: -Pt with elevated ammonia, lactulose ordered TID.  -Assisted in improving constipation; however resulted in ongoing loose stools. Plan to decrease lactulose and add H2 blockade -Oxepa to continue as 20 ml/hr trickle feeds until loose stools episodes decrease. 60 ml residuals per RN  -Pt being diuresed, -2  L fluid balance, and only 4 lbs increased from admit weight. -Mg repleted, Phos/Mg/K all WNL  10/21: -Pt receiving Oxepa at 30 ml/hr, which provides 1080 kcal (57% est kcal needs), and 45 gram protein (66% re-est protein needs) -Ordered Pro-Stat 30 ml once daily to provide 100 kcal, 15 gram protein -92m residuals noted, continues with loose stools d/t lactulose. -  Lactulose not decreased d/t continued hepatic encephalopathy. Modified protein requirements, and will increase as warranted -Phos/K//Mg WNL, glucose slightly elevated -Pt self extubated on 10/20, and was reintubated. Plan for trach placement on 10/22  Height: Ht Readings from Last 1 Encounters:  06/10/14 5' 10"  (1.778 m)    Weight Status:   Wt Readings from Last 1 Encounters:  06/16/14 214 lb 15.2 oz (97.5 kg)  06/05/14 208 lb (94.5 kg)  Re-estimated needs:  Kcal: 1883 Protein: 60-78 gram (d/t encephalopathy, 100-115 gram when resolved) Fluid: 2.4L  Skin: WDL  Diet Order: NPO   Intake/Output Summary (Last 24 hours) at 06/17/14 1420 Last data filed at 06/17/14 1400  Gross per 24 hour  Intake 4105.68 ml  Output    840 ml  Net 3265.68 ml    Last BM: 10/21   Labs:   Recent Labs Lab 06/15/14 0455 06/16/14 0530 06/17/14 0500  NA 148* 152* 147  K 4.1 4.4 3.9  CL 116* 119* 116*  CO2 21 22 21   BUN 15 19 21   CREATININE 1.28 1.06 1.00  CALCIUM 8.6 8.8 8.6  MG 1.7 2.0 1.8  PHOS 3.0 3.9 3.6  GLUCOSE 117* 132* 147*    CBG (last 3)   Recent Labs  06/17/14 0349 06/17/14 0751 06/17/14 1156  GLUCAP 159* 137* 136*    Scheduled Meds: . antiseptic oral rinse  7 mL Mouth Rinse QID  . chlorhexidine  15 mL Mouth Rinse BID  . clonazepam  1 mg Oral BID  . cloNIDine  0.1 mg Oral TID  . feeding supplement (OXEPA)  1,000 mL Per Tube Q24H  . feeding supplement (PRO-STAT SUGAR FREE 64)  30 mL Per Tube q morning - 194M . folic acid  1 mg Intravenous Daily  . free water  200 mL Per Tube 3 times per day  . furosemide  40 mg Intravenous Q12H  . insulin aspart  0-9 Units Subcutaneous 6 times per day  . lactulose  20 g Per Tube TID  . potassium chloride  20 mEq Per Tube BID  . QUEtiapine  25 mg Oral QHS  . ranitidine  150 mg Oral QHS  . rifaximin  400 mg Oral TID  . sodium chloride  3 mL Intravenous Q12H  . thiamine IV  100 mg Intravenous Daily    Continuous  Infusions: . sodium chloride 10 mL/hr at 06/17/14 0800  . dexmedetomidine 1.2 mcg/kg/hr (06/17/14 1400)  . dextrose 50 mL (06/17/14 0129)  . fentaNYL infusion INTRAVENOUS 175 mcg/hr (06/17/14 1400)  . midazolam (VERSED) infusion 4 mg/hr (06/17/14 1400)    SWashingtonvilleLDN Clinical Dietitian [id number]:[phone]

## 2014-06-17 NOTE — Progress Notes (Signed)
PULMONARY / CRITICAL CARE MEDICINE  Name: Dion BodyWilliam Rondinelli MRN: [id number] DOB:  10/28/1975 ADMISSION DATE: 06/05/2014  CONSULTATION DATE: 06/08/2014  REFERRING MD : TRH  CHIEF COMPLAINT: Respiratory Failure  INITIAL PRESENTATION:  38 yo male with hx of ETOH presented with dyspnea/fatigue from profound anemia (Hb 4.5), and received multiple transfusions of PRBC.  Developed progressive respiratory failure with Rt > Lt pulmonary infiltrates, and PCCM assumed care in ICU.  SIGNIFICANT EVENTS:  10/09 admitted; PRBC x3, GI consulted 10/10 PRBC x1 10/11 PRBC x1; new onset PNA on chest x ray 10/12: acute hypoxic respiratory failure overnight requiring BiPAP. Alcohol withdrawal requiring precedex gtt. Intubated for decreasing LOC and increasing 02 needs and worsening PNA on CXR.  10/13 bronchoscopy >> respiratory secretions from RLL, friable airways  - 10/13 Rt thoracentesis > 500 ml fluid: transudate  10/14 gastric residuals  - 10/14 Echo >> EF 55 to 60%, PAS 33 mmHg 10/16 acidosis ?from diprivan >> change to versed gtt  06/13/14:  Significant intermittent agitation.  On 45% fio2.hick tenacious ET tube secretions + .  on fent 150mcg and versed 6mg /h gtt. START LACTULOSE AND XIFAXAN 10/19 fio2 50 % and peep 12 on ARDS protocol 10/20 fio2 40% 8 peep. Slow progress 10/20 self extubation with urgent reintubation and fall off bed to floor 10/21 plan for trach tomorrow thursday  Intake/Output Summary (Last 24 hours) at 06/17/14 0849 Last data filed at 06/17/14 0800  Gross per 24 hour  Intake 3521.48 ml  Output   1330 ml  Net 2191.48 ml    SUBJECTIVE/OVERNIGHT/INTERVAL HX 10/21++ I/O with Lasix decreased to daily, sedated on vent. Follows commands but easily agitated  PHYSICAL EXAM:  Vital signs in last 24 hours: Temp:  [97 F (36.1 C)-98.6 F (37 C)] 98.6 F (37 C) (10/21 0800) Pulse Rate:  [61-103] 70 (10/21 0800) Resp:  [16-25] 16 (10/21 0800) BP: (91-142)/(34-62) 105/42 mmHg  (10/21 0800) SpO2:  [90 %-99 %] 97 % (10/21 0800) FiO2 (%):  [40 %-100 %] 40 % (10/21 0800) Last BM Date: 06/16/14 VENT SETTINGS: Vent Mode:  [-] PRVC FiO2 (%):  [40 %-100 %] 40 % Set Rate:  [16 bmp] 16 bmp Vt Set:  [580 mL] 580 mL PEEP:  [8 cmH20] 8 cmH20 Plateau Pressure:  [13 cmH20-19 cmH20] 18 cmH20 I/O: Intake/Output     10/20 0701 - 10/21 0700 10/21 0701 - 10/22 0700   I.V. (mL/kg) 2835.8 (29.1) 163.9 (1.7)   NG/GT 580 30   Total Intake(mL/kg) 3415.8 (35) 193.9 (2)   Urine (mL/kg/hr) 1380 (0.6) 50 (0.3)   Stool     Total Output 1380 50   Net +2035.8 +143.9        Stool Occurrence 2 x      PHYSICAL EXAMINATION:  General: no distress, heavily sedated but agitated when sedation decreased. Neuro: RASS -2 and fluctutates to +3 when sedation decreased. Following command - first time though for MD HEENT: ETT in place, no jvd/LAN Cardiovascular: regular HSR RRR Lungs: scattered rhonchi, decreased in bases(note his cords were edematous during intubation 10/20) Abdomen: Soft, non-tender, + bowel sounds, TF's running Musculoskeletal: no edema Skin: warm/dry  Lab Results: PULMONARY  Recent Labs Lab 06/10/14 1103 06/11/14 0505 06/12/14 0400 06/14/14 1202 06/15/14 1106  PHART 7.351 7.362 7.372 7.420 7.376  PCO2ART 30.8* 27.5* 26.1* 32.3* 39.3  PO2ART 71.0* 69.5* 70.0* 100.0 132.0*  HCO3 16.6* 15.2* 14.8* 20.6 22.5  TCO2 15.9 14.6 14.1 19.4 21.2  O2SAT 94.6 93.4 93.8 98.1 99.3  CBC  Recent Labs Lab 06/15/14 0455 06/16/14 0530 06/17/14 0500  HGB 8.0* 8.0* 8.1*  HCT 26.5* 26.5* 27.0*  WBC 10.8* 9.4 10.7*  PLT 201 192 228    COAGULATION  Recent Labs Lab 06/14/14 0453  INR 1.40    CARDIAC  No results found for this basename: TROPONINI,  in the last 168 hours No results found for this basename: PROBNP,  in the last 168 hours   CHEMISTRY  Recent Labs Lab 06/12/14 0545 06/13/14 0530 06/14/14 0453 06/15/14 0455 06/16/14 0530 06/17/14 0500  NA  140 144 147 148* 152* 147  K 4.4 3.9 4.1 4.1 4.4 3.9  CL 112 115* 116* 116* 119* 116*  CO2 13* 16* 20 21 22 21   GLUCOSE 119* 112* 116* 117* 132* 147*  BUN 9 9 11 15 19 21   CREATININE 1.14 1.16 1.17 1.28 1.06 1.00  CALCIUM 7.6* 8.1* 8.6 8.6 8.8 8.6  MG 2.1  --  1.7 1.7 2.0 1.8  PHOS 2.1*  --  2.9 3.0 3.9 3.6   Estimated Creatinine Clearance: 119.6 ml/min (by C-G formula based on Cr of 1).   LIVER  Recent Labs Lab 06/11/14 0345 06/12/14 0545 06/14/14 0453 06/16/14 0530  AST 78* 83*  --  88*  ALT 15 16  --  15  ALKPHOS 170* 170*  --  176*  BILITOT 2.3* 2.0*  --  2.4*  PROT 6.0 6.3  --  7.1  ALBUMIN 2.1* 2.1*  --  2.3*  INR  --   --  1.40  --      INFECTIOUS No results found for this basename: LATICACIDVEN, PROCALCITON,  in the last 168 hours   ENDOCRINE CBG (last 3)   Recent Labs  06/17/14 0007 06/17/14 0349 06/17/14 0751  GLUCAP 131* 159* 137*         IMAGING x48h Dg Abd 1 View  06/16/2014   CLINICAL DATA:  Status post feeding tube placement  EXAM: ABDOMEN - 1 VIEW  COMPARISON:  Supine abdominal film of 11 June 2014  FINDINGS: The esophagogastric tube is been removed. There has been interval placement of a radiodense-tipped feeding tube. Its tip lies in the region of the first portion of the duodenum.  The retrocardiac region on the left is dense with partial obscuration of the hemidiaphragm.  IMPRESSION: There has been interval placement of a radiodense tipped feeding tube. The tip lies in the region of the proximal duodenum.   Electronically Signed   By: David  [place]   On: 06/16/2014 14:41   Ct Head Wo Contrast  06/16/2014   CLINICAL DATA:  Fall. Diabetes. Hypertension. Respiratory failure.  EXAM: CT HEAD WITHOUT CONTRAST  TECHNIQUE: Contiguous axial images were obtained from the base of the skull through the vertex without intravenous contrast.  COMPARISON:  None.  FINDINGS: The brainstem, cerebellum, cerebral peduncles, thalamus, basal ganglia, basilar  cisterns, and ventricular system appear within normal limits. No intracranial hemorrhage, mass lesion, or acute CVA. Polypoid mucoperiosteal thickening in the right maxillary sinus. Bilateral mastoid effusions. Middle ear fluid on the left.  IMPRESSION: 1. No significant intracranial abnormality is observed. 2. Middle ear fluid on the left suspicious for otitis media. Bilateral mastoid effusions. 3. 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   Electronically Signed   By: Herbie BaltimoreWalt  Liebkemann M.D.   On: 06/16/2014 15:25   Dg Chest Port 1 View  06/16/2014   CLINICAL DATA:  Assess endotracheal tube positioning  EXAM: PORTABLE CHEST - 1 VIEW  COMPARISON:  Portable chest x-ray of today's date at 4:53 a.m.  FINDINGS: The endotracheal tube tip lies approximately 4.9 cm above the crotch of the carina. The esophagogastric tube has been removed and a feeding tube is in place with the tip projecting below the inferior margin of the image. The left internal jugular venous catheter tip projects over the proximal SVC.  The lungs are borderline hypoinflated. There is bibasilar atelectasis and there has developed further obscuration of the left hemidiaphragm and partial obscuration of the right hemidiaphragm. The cardiac silhouette remains enlarged. The central pulmonary vascularity remains engorged.  IMPRESSION: 1. Positioning of the endotracheal tube is approximately 4.9 cm above the crotch of the carina. The other support tubes and lines are in appropriate position where visualized. 2. Increasing density at the lung bases is consistent with atelectasis or small amounts of pleural fluid. 3. CHF with mild interstitial edema.   Electronically Signed   By: David  [place]   On: 06/16/2014 14:49   Dg Chest Port 1 View  06/16/2014   CLINICAL DATA:  Hypoxia, personal history diabetes mellitus and hypertension  EXAM: PORTABLE CHEST - 1 VIEW  COMPARISON:  Portable exam 0453 hr compared to 06/15/2014  FINDINGS: Tip of  endotracheal tube projects 5.8 cm above carina.  Tip of LEFT jugular central venous catheter projects over SVC.  Nasogastric tube extends into abdomen.  Enlargement of cardiac silhouette with pulmonary vascular congestion.  Bibasilar atelectasis.  Slightly improved pulmonary edema.  No gross pleural effusion or pneumothorax.  IMPRESSION: Enlargement of cardiac silhouette with pulmonary vascular congestion and slightly improved pulmonary edema.  Mild bibasilar atelectasis.   Electronically Signed   By: Ulyses Southward M.D.   On: 06/16/2014 07:46       ASSESSMENT / PLAN:  PULMONARY  OETT 10/12 >>10/20 self extubation, re intubated 10/20>> A:  Acute hypoxic respiratory failure 2nd to PNA, and b/l pleural effusions (transudate on Rt).   - does not meet sbt criteria due to agitation though ALI componnent better. Needs Trach   P:  Wean PEEP/FiO2 per ARDS protocol F/u CXR SBT as tolerated Diuresis as tolerated(see renal) Trach planned 06/18/14 at 14.15h by Dr Tyson Alias   CARDIOVASCULAR  A:  Hypotension 2nd to sedation meds.(resolved off pressors) Hypervolemia.   P:  Lasix  Since 06/12/14   RENAL  Lab Results  Component Value Date   CREATININE 1.00 06/17/2014   CREATININE 1.06 06/16/2014   CREATININE 1.28 06/15/2014    Recent Labs Lab 06/15/14 0455 06/16/14 0530 06/17/14 0500  NA 148* 152* 147    A:  Stage III CKD. Non gap metabolic acidosis ?from diprivan. 06/12/14  Hypernatremia (resolving)   P:  Monitor renal fx, urine outpt F/u ABG, BMET Free h2o started 10/20 IVF changed to D5W 10/20 Lasix decreased to daily 10/20. Goal is negative i/o with Na decrease. Lasix increased to q 12 h 10/21 to achieve at least balanced or negative i/o   GASTROINTESTINAL  A:  Severe protein calorie malnutrition.  ETOH with cirrhosis.  - Difficulty tolerating tube feeds. - persists 06/16/14  -  - constipation resolved 06/14/14 after lactulose.   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 P:  F/u CBC Transfuse for hgb <7.0   INFECTIOUS  A:  Right >  left PNA. HCAP vs aspiration.  Flu negative. MRSA swab positive. P:  Day 7/7 of Abx,  cefepime - stopped 06/14/14 D/c vancomycin 10/16  ENDOCRINE  A:  DMII with nephropathy. CBG (last 3)   Recent Labs  06/17/14 0007 06/17/14 0349 06/17/14 0751  GLUCAP 131* 159* 137*     P:  SSI  NEUROLOGIC  A:  Acute encephalopathy 2nd to delirium tremens, respiratory failure.Possible associted hepatic encephalopathy +  With high Ammonia.  - 06/13/14: Started Lactulose and Xifaxan 06/13/14 (off diprivan 10/16)   - 06/17/14: encephalopathy continues.  . Either sedated or agitated but ? Some better. Following command for 1st time  P:  RASS goal -2 Continuet versed gtt 10/19 with prn fentanyl and fent gtt and precedex gtt Continue  LACTULOSE and XIFAXAN since  06/13/14 due to possible heaptic encephalopathy , Continue thiamine, folic acid Foot drop boots Continue  low dose clonidine since10/20/15 Start seroquel 06/17/14  po to hopefully decrease IV sedation and allow smoother weaning of sedation. STart clonidine 06/17/14 (check ekg for qtc 06/18/14)  Family updated:  Parents updated at bedside 10/21  Interdisciplinary meeting : held 06/14/14 at bedside with mom, dad, and sister and RN Amy: explained likely heading into prolonged mech vent state, chronic critical illness state and implications long term. Suggested meets indication for trach and need for LTACH. They seem inclined to move forward if does not wean off vent next few to several days   Brett Canales Minor ACNP [facility] PCCM Pager [phone] till 3 pm If no answer page [phone] 06/17/2014, 8:49 AM       STAFF NOTE: I, Dr Lavinia Sharps have personally reviewed  patient's available data, including medical history, events of note, physical examination and test results as part of my evaluation. I have discussed with resident/NP and other care providers such as pharmacist, RN and RRT.  In addition,  I personally evaluated patient and elicited key findings of  Acute resp failure in etoh cirrhosis following gi bleed and then ARDS. Now encephalopathy main barrier to wean. LOS 12 days. Pln for trach 06/18/14 byt Dr Tyson Alias. In order to deal with his enceph: I have asked NP to increase his lactulose and in addition add seroquel We will add clonidine as well.  Rest per NP/medical resident whose note is outlined above and that I agree with  The patient is critically ill with multiple organ systems failure and requires high complexity decision making for assessment and support, frequent evaluation and titration of therapies, application of advanced monitoring technologies and extensive interpretation of multiple databases.   Critical Care Time devoted to patient care services described in this note is  35  Minutes. This time reflects time of care of this signee Dr Kalman Shan. This critical care time does not reflect procedure time, or teaching time or supervisory time of PA/NP/Med student/Med Resident etc but could involve care discussion time    Dr. Kalman Shan, M.D., [facility].C.P Pulmonary and Critical Care Medicine Staff Physician [facility] [place] [facility] Pager: [phone], If no answer or between  15:00h - 7:00h: call [phone]  06/17/2014 9:55 AM

## 2014-06-17 NOTE — Progress Notes (Signed)
[facility] Pt combative & attempting to get up, 4 nurses at bedside, [facility] Darrick Pennaeterding to advance sedation to max levels and start Neo if warranted by MAP.

## 2014-06-18 ENCOUNTER — Inpatient Hospital Stay (HOSPITAL_COMMUNITY): Payer: BC Managed Care – PPO

## 2014-06-18 DIAGNOSIS — J9600 Acute respiratory failure, unspecified whether with hypoxia or hypercapnia: Secondary | ICD-10-CM

## 2014-06-18 LAB — CBC
HCT: 25.9 % — ABNORMAL LOW (ref 39.0–52.0)
Hemoglobin: 7.9 g/dL — ABNORMAL LOW (ref 13.0–17.0)
MCH: 24.6 pg — ABNORMAL LOW (ref 26.0–34.0)
MCHC: 30.5 g/dL (ref 30.0–36.0)
MCV: 80.7 fL (ref 78.0–100.0)
Platelets: 187 10*3/uL (ref 150–400)
RBC: 3.21 MIL/uL — ABNORMAL LOW (ref 4.22–5.81)
RDW: 21.4 % — AB (ref 11.5–15.5)
WBC: 9 10*3/uL (ref 4.0–10.5)

## 2014-06-18 LAB — BASIC METABOLIC PANEL
ANION GAP: 11 (ref 5–15)
Anion gap: 10 (ref 5–15)
BUN: 23 mg/dL (ref 6–23)
BUN: 22 mg/dL (ref 6–23)
CALCIUM: 8.3 mg/dL — AB (ref 8.4–10.5)
CALCIUM: 8.4 mg/dL (ref 8.4–10.5)
CHLORIDE: 112 meq/L (ref 96–112)
CO2: 19 mEq/L (ref 19–32)
CO2: 18 mEq/L — ABNORMAL LOW (ref 19–32)
CREATININE: 1.06 mg/dL (ref 0.50–1.35)
Chloride: 112 mEq/L (ref 96–112)
Creatinine, Ser: 0.97 mg/dL (ref 0.50–1.35)
GFR calc non Af Amer: >90 mL/min (ref >90)
GFR, EST NON AFRICAN AMERICAN: 89 mL/min — AB (ref >90)
Glucose, Bld: 133 mg/dL — ABNORMAL HIGH (ref 70–99)
Glucose, Bld: 138 mg/dL — ABNORMAL HIGH (ref 70–99)
POTASSIUM: 4.3 meq/L (ref 3.7–5.3)
Potassium: 4.1 mEq/L (ref 3.7–5.3)
Sodium: 141 mEq/L (ref 137–147)
Sodium: 141 mEq/L (ref 137–147)

## 2014-06-18 LAB — BLOOD GAS, ARTERIAL
Acid-base deficit: 6.3 mmol/L — ABNORMAL HIGH (ref 0.0–2.0)
Allens test (pass/fail): POSITIVE — AB
Bicarbonate: 17.8 mEq/L — ABNORMAL LOW (ref 20.0–24.0)
Drawn by: 422461
FIO2: 0.4 %
MECHVT: 580 mL
O2 SAT: 90.6 %
PEEP: 5 cmH2O
PO2 ART: 69.5 mmHg — AB (ref 80.0–100.0)
Patient temperature: 100.8
RATE: 16 resp/min
TCO2: 17.1 mmol/L (ref 0–100)
pCO2 arterial: 33.8 mmHg — ABNORMAL LOW (ref 35.0–45.0)
pH, Arterial: 7.349 — ABNORMAL LOW (ref 7.350–7.450)

## 2014-06-18 LAB — GLUCOSE, CAPILLARY
GLUCOSE-CAPILLARY: 141 mg/dL — AB (ref 70–99)
Glucose-Capillary: 123 mg/dL — ABNORMAL HIGH (ref 70–99)
Glucose-Capillary: 126 mg/dL — ABNORMAL HIGH (ref 70–99)
Glucose-Capillary: 130 mg/dL — ABNORMAL HIGH (ref 70–99)
Glucose-Capillary: 144 mg/dL — ABNORMAL HIGH (ref 70–99)
Glucose-Capillary: 118 mg/dL — ABNORMAL HIGH (ref 70–99)

## 2014-06-18 LAB — PROTIME-INR
INR: 1.52 — ABNORMAL HIGH (ref 0.00–1.49)
PROTHROMBIN TIME: 18.4 s — AB (ref 11.6–15.2)

## 2014-06-18 LAB — MAGNESIUM: Magnesium: 2.2 mg/dL (ref 1.5–2.5)

## 2014-06-18 LAB — PHOSPHORUS: PHOSPHORUS: 3.7 mg/dL (ref 2.3–4.6)

## 2014-06-18 MED ORDER — FUROSEMIDE 10 MG/ML IJ SOLN
60.0000 mg | Freq: Three times a day (TID) | INTRAMUSCULAR | Status: DC
  Administered 2014-06-18 – 2014-06-21 (×9): 60 mg via INTRAVENOUS
  Filled 2014-06-18 (×9): qty 6

## 2014-06-18 MED ORDER — PROPOFOL 10 MG/ML IV EMUL
5.0000 ug/kg/min | Freq: Once | INTRAVENOUS | Status: AC
  Administered 2014-06-18: 30 ug/kg/min via INTRAVENOUS
  Administered 2014-06-18: 20 ug/kg/min via INTRAVENOUS
  Filled 2014-06-18: qty 100

## 2014-06-18 MED ORDER — VECURONIUM BROMIDE 10 MG IV SOLR
10.0000 mg | Freq: Once | INTRAVENOUS | Status: AC
  Administered 2014-06-18: 10 mg via INTRAVENOUS

## 2014-06-18 MED ORDER — FREE WATER
200.0000 mL | Freq: Two times a day (BID) | Status: DC
  Administered 2014-06-18 – 2014-06-19 (×3): 200 mL

## 2014-06-18 MED ORDER — VITAMIN B-1 100 MG PO TABS
100.0000 mg | ORAL_TABLET | Freq: Every day | ORAL | Status: DC
  Administered 2014-06-18 – 2014-06-22 (×5): 100 mg via ORAL
  Filled 2014-06-18 (×5): qty 1

## 2014-06-18 NOTE — Progress Notes (Signed)
PULMONARY / CRITICAL CARE MEDICINE  Name: Jerome [name] MRN: [id number] DOB:  1976-03-15 ADMISSION DATE: 06/05/2014  CONSULTATION DATE: 06/08/2014  REFERRING MD : TRH  CHIEF COMPLAINT: Respiratory Failure  INITIAL PRESENTATION:  38 yo male with hx of ETOH presented with dyspnea/fatigue from profound anemia (Hb 4.5), and received multiple transfusions of PRBC.  Developed progressive respiratory failure with Rt > Lt pulmonary infiltrates, and PCCM assumed care in ICU.  SIGNIFICANT EVENTS:  10/09 admitted; PRBC x3, GI consulted 10/10 PRBC x1 10/11 PRBC x1; new onset PNA on chest x ray 10/12: acute hypoxic respiratory failure overnight requiring BiPAP. Alcohol withdrawal requiring precedex gtt. Intubated for decreasing LOC and increasing 02 needs and worsening PNA on CXR.  10/13 bronchoscopy >> respiratory secretions from RLL, friable airways  - 10/13 Rt thoracentesis > 500 ml fluid: transudate  10/14 gastric residuals  - 10/14 Echo >> EF 55 to 60%, PAS 33 mmHg 10/16 acidosis ?from diprivan >> change to versed gtt  06/13/14:  Significant intermittent agitation.  On 45% fio2.hick tenacious ET tube secretions + .  on fent and versed 6mg /h gtt. START LACTULOSE AND XIFAXAN 10/19 fio2 50 % and peep 12 on ARDS protocol 10/20 fio2 40% 8 peep. Slow progress 10/20 self extubation with urgent reintubation and fall off bed to floor 10/21 plan for trach tomorrow Thursday 10/22 trached per df  Intake/Output Summary (Last 24 hours) at 06/18/14 0850 Last data filed at 06/18/14 0800  Gross per 24 hour  Intake 3379.53 ml  Output    945 ml  Net 2434.53 ml    SUBJECTIVE/OVERNIGHT/INTERVAL HX Sedated or agitated. + I/0 but getting free h20 for hypernatremia.  PHYSICAL EXAM:  Vital signs in last 24 hours: Temp:  [98.6 F (37 C)-101.3 F (38.5 C)] 99.9 F (37.7 C) (10/22 0800) Pulse Rate:  [71-114] 72 (10/22 0800) Resp:  [16-36] 16 (10/22 0800) BP: (94-138)/(36-70) 107/47 mmHg (10/22  0800) SpO2:  [90 %-100 %] 93 % (10/22 0800) FiO2 (%):  [40 %] 40 % (10/22 0324) Weight:  [229 lb 11.5 oz (104.2 kg)] 229 lb 11.5 oz (104.2 kg) (10/22 0100) Last BM Date: 06/16/14 VENT SETTINGS: Vent Mode:  [-] PRVC FiO2 (%):  [40 %] 40 % Set Rate:  [16 bmp] 16 bmp Vt Set:  [580 mL] 580 mL PEEP:  [5 cmH20] 5 cmH20 Plateau Pressure:  [14 cmH20-17 cmH20] 14 cmH20 I/O: Intake/Output     10/21 0701 - 10/22 0700 10/22 0701 - 10/23 0700   I.V. (mL/kg) 2660.1 (25.5) 178.3 (1.7)   NG/GT 810    IV Piggyback 50    Total Intake(mL/kg) 3520.1 (33.8) 178.3 (1.7)   Urine (mL/kg/hr) 870 (0.3) 125 (0.7)   Total Output 870 125   Net +2650.1 +53.3          PHYSICAL EXAMINATION:  General: no distress, heavily sedated but agitated when sedation decreased. Neuro: RASS -2 and fluctutates to +3 when sedation decreased.  HEENT: ETT in place, no jvd/LAN, left I J cvl Cardiovascular: regular HSR RRR Lungs: scattered rhonchi, decreased in bases(note his cords were edematous during intubation 10/20) Abdomen: Soft, non-tender, + bowel sounds, TF's on hold for trach Musculoskeletal: no edema Skin: warm/dry  Lab Results: PULMONARY  Recent Labs Lab 06/12/14 0400 06/14/14 1202 06/15/14 1106 06/17/14 1002 06/18/14 0528  PHART 7.372 7.420 7.376 7.366 7.349*  PCO2ART 26.1* 32.3* 39.3 34.2* 33.8*  PO2ART 70.0* 100.0 132.0* 72.3* 69.5*  HCO3 14.8* 20.6 22.5 19.0* 17.8*  TCO2 14.1 19.4 21.2 18.2  17.1  O2SAT 93.8 98.1 99.3 93.8 90.6    CBC  Recent Labs Lab 06/16/14 0530 06/17/14 0500 06/18/14 0416  HGB 8.0* 8.1* 7.9*  HCT 26.5* 27.0* 25.9*  WBC 9.4 10.7* 9.0  PLT 192 228 187    COAGULATION  Recent Labs Lab 06/14/14 0453 06/18/14 0416  INR 1.40 1.52*    CARDIAC  No results found for this basename: TROPONINI,  in the last 168 hours No results found for this basename: PROBNP,  in the last 168 hours   CHEMISTRY  Recent Labs Lab 06/14/14 0453 06/15/14 0455 06/16/14 0530  06/17/14 0500 06/18/14 0416  NA 147 148* 152* 147 141  K 4.1 4.1 4.4 3.9 4.3  CL 116* 116* 119* 116* 112  CO2 20 21 22 21 19   GLUCOSE 116* 117* 132* 147* 133*  BUN 11 15 19 21 23   CREATININE 1.17 1.28 1.06 1.00 1.06  CALCIUM 8.6 8.6 8.8 8.6 8.3*  MG 1.7 1.7 2.0 1.8 2.2  PHOS 2.9 3.0 3.9 3.6 3.7   Estimated Creatinine Clearance: 116.5 ml/min (by C-G formula based on Cr of 1.06).   LIVER  Recent Labs Lab 06/12/14 0545 06/14/14 0453 06/16/14 0530 06/18/14 0416  AST 83*  --  88*  --   ALT 16  --  15  --   ALKPHOS 170*  --  176*  --   BILITOT 2.0*  --  2.4*  --   PROT 6.3  --  7.1  --   ALBUMIN 2.1*  --  2.3*  --   INR  --  1.40  --  1.52*     INFECTIOUS No results found for this basename: LATICACIDVEN, PROCALCITON,  in the last 168 hours   ENDOCRINE CBG (last 3)   Recent Labs  06/17/14 0751 06/17/14 1156 06/17/14 1540  GLUCAP 137* 136* 128*         IMAGING x48h Dg Abd 1 View  06/16/2014   CLINICAL DATA:  Status post feeding tube placement  EXAM: ABDOMEN - 1 VIEW  COMPARISON:  Supine abdominal film of 11 June 2014  FINDINGS: The esophagogastric tube is been removed. There has been interval placement of a radiodense-tipped feeding tube. Its tip lies in the region of the first portion of the duodenum.  The retrocardiac region on the left is dense with partial obscuration of the hemidiaphragm.  IMPRESSION: There has been interval placement of a radiodense tipped feeding tube. The tip lies in the region of the proximal duodenum.   Electronically Signed   By: David  Swazil[place]Jordan   On: 06/16/2014 14:41   Ct Head Wo Contrast  06/16/2014   CLINICAL DATA:  Fall. Diabetes. Hypertension. Respiratory failure.  EXAM: CT HEAD WITHOUT CONTRAST  TECHNIQUE: Contiguous axial images were obtained from the base of the skull through the vertex without intravenous contrast.  COMPARISON:  None.  FINDINGS: The brainstem, cerebellum, cerebral peduncles, thalamus, basal ganglia, basilar  cisterns, and ventricular system appear within normal limits. No intracranial hemorrhage, mass lesion, or acute CVA. Polypoid mucoperiosteal thickening in the right maxillary sinus. Bilateral mastoid effusions. Middle ear fluid on the left.  IMPRESSION: 1. No significant intracranial abnormality is observed. 2. Middle ear fluid on the left suspicious for otitis media. Bilateral mastoid effusions. 3. 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   Electronically Signed   By: Herbie BaltimoreWalt  Liebkemann M.D.   On: 06/16/2014 15:25   Dg Chest Port 1 View  06/18/2014   CLINICAL DATA:  Respiratory failure.  Endotracheal  tube positioning.  EXAM: PORTABLE CHEST - 1 VIEW  COMPARISON:  06/17/2014  FINDINGS: The patient is rotated to the Right on today's radiograph, reducing diagnostic sensitivity and specificity. Endotracheal tube tip is 2 cm above the carina. Left IJ line tip: Upper SVC.  Feeding tube enters the stomach. Coiled wires project over the chest.  Increased bilateral lower lobe airspace opacities. Cannot exclude layering pleural effusions. Rib deformities from old healed fractures, left greater than right. Stably enlarged cardiopericardial silhouette.  IMPRESSION: 1. Increased bibasilar airspace opacity likely representing combination of atelectasis and layering pleural effusions. Strictly speaking, multi lobar pneumonia is not excluded.   Electronically Signed   By: Herbie BaltimoreWalt  Liebkemann M.D.   On: 06/18/2014 07:35   Dg Chest Port 1 View  06/17/2014   CLINICAL DATA:  Respiratory failure. History of alcoholism, cirrhosis,esophageal varices, diabetes, and hypertension.  EXAM: PORTABLE CHEST - 1 VIEW  COMPARISON:  06/16/2014  FINDINGS: Endotracheal tube remains in place with tip approximately 5.5-6 cm above the carina. Enteric tube courses into the left upper abdomen with tip not imaged. Left jugular central venous catheter remains in place with tip overlying the mid SVC, unchanged. Cardiac silhouette remains enlarged.  Lung volumes remain mildly diminished with patchy opacities in both lung bases, mildly improved from prior. Pulmonary vascular congestion has improved. No definite pleural effusion or pneumothorax is identified.  IMPRESSION: Mildly improved aeration of the lung bases with bibasilar opacities likely reflecting atelectasis. Mildly improved pulmonary vascular congestion/mild edema.   Electronically Signed   By: Sebastian AcheAllen  Grady   On: 06/17/2014 10:05   Dg Chest Port 1 View  06/16/2014   CLINICAL DATA:  Assess endotracheal tube positioning  EXAM: PORTABLE CHEST - 1 VIEW  COMPARISON:  Portable chest x-ray of today's date at 4:53 a.m.  FINDINGS: The endotracheal tube tip lies approximately 4.9 cm above the crotch of the carina. The esophagogastric tube has been removed and a feeding tube is in place with the tip projecting below the inferior margin of the image. The left internal jugular venous catheter tip projects over the proximal SVC.  The lungs are borderline hypoinflated. There is bibasilar atelectasis and there has developed further obscuration of the left hemidiaphragm and partial obscuration of the right hemidiaphragm. The cardiac silhouette remains enlarged. The central pulmonary vascularity remains engorged.  IMPRESSION: 1. Positioning of the endotracheal tube is approximately 4.9 cm above the crotch of the carina. The other support tubes and lines are in appropriate position where visualized. 2. Increasing density at the lung bases is consistent with atelectasis or small amounts of pleural fluid. 3. CHF with mild interstitial edema.   Electronically Signed   By: David  [place]   On: 06/16/2014 14:49       ASSESSMENT / PLAN:  PULMONARY  OETT 10/12 >>10/20 self extubation, re intubated 10/20>> A:  Acute hypoxic respiratory failure 2nd to PNA, and b/l pleural effusions (transudate on Rt).   - does not meet sbt criteria due to agitation though ALI componnent better.  Trach planned for 10/22   P:   Wean PEEP/FiO2 per ARDS protocol F/u CXR SBT as tolerated Diuresis as tolerated(see renal) Trach planned 06/18/14 at 1400 by Dr Tyson AliasFeinstein   CARDIOVASCULAR  A:  Hypotension 2nd to sedation meds.(resolved off pressors) Hypervolemia.   P:  Lasix  Since 06/12/14   RENAL  Lab Results  Component Value Date   CREATININE 1.06 06/18/2014   CREATININE 1.00 06/17/2014   CREATININE 1.06 06/16/2014    Recent Labs  Lab 06/16/14 0530 06/17/14 0500 06/18/14 0416  NA 152* 147 141    A:  Stage III CKD. Non gap metabolic acidosis ?from diprivan. 06/12/14  Hypernatremia (resolving)   P:  Monitor renal fx, urine outpt F/u ABG, BMET Free h2o started 10/20, decreased 10/22 IVF changed to D5W 10/20, 10/22 kvo Lasix decreased to daily 10/20. Goal is negative i/o with Na decrease. Lasix increased to q 12 h 10/21 to achieve at least balanced or negative i/o. 10/22 increase lasix to q 8 h and monitor lytes.   GASTROINTESTINAL  A:  Severe protein calorie malnutrition.  ETOH with cirrhosis.  - Difficulty tolerating tube feeds. - persists 06/16/14  -  - constipation resolved 06/14/14 after lactulose.   P:   Resume tube feeds 10/16 >> increase at 30 ml/h on 10/20 Change protonix to h2 blockade due to diarrhea Continue lactulose at 20 gm tid Continue xifaxan   HEMATOLOGIC  A:  Anemia from chronic GI bleeding >> stable at present. P:  F/u CBC Transfuse for hgb <7.0   INFECTIOUS  A:  Right > left PNA. HCAP vs aspiration.  Flu negative. MRSA swab positive. P:  Day 7/7 of Abx,  cefepime - stopped 06/14/14 D/c vancomycin 10/16  ENDOCRINE  A:  DMII with nephropathy. CBG (last 3)   Recent Labs  06/17/14 0751 06/17/14 1156 06/17/14 1540  GLUCAP 137* 136* 128*     P:  SSI  NEUROLOGIC  A:  Acute encephalopathy 2nd to delirium tremens, respiratory failure.Possible associted hepatic encephalopathy +  With high Ammonia.  - 06/13/14: Started Lactulose and Xifaxan  06/13/14 (off diprivan 10/16)   - 06/17/14: encephalopathy continues.  . Either sedated or agitated but ? Some better.   P:  RASS goal -2 Continuet versed gtt 10/19 with prn fentanyl and fent gtt and precedex gtt Continue  LACTULOSE and XIFAXAN since  06/13/14 due to possible heaptic encephalopathy , Continue thiamine, folic acid Foot drop boots Continue  low dose clonidine since10/20/15 Start seroquel 06/17/14  po to hopefully decrease IV sedation and allow smoother weaning of sedation. Started clonidine 06/17/14 (check ekg for qtc 06/18/14)  Family updated:  Parents updated at bedside 10/22  Interdisciplinary meeting : held 06/14/14 at bedside with mom, dad, and sister and RN Amy: explained likely heading into prolonged mech vent state, chronic critical illness state and implications long term. Suggested meets indication for trach and need for LTACH. They seem inclined to move forward if does not wean off vent next few to several days  He is improving slowly. Suspect with trach and oral sedation he will wean from vent.   Brett Canales Lupe Bonner ACNP [facility] PCCM Pager [phone] till 3 pm If no answer page [phone] 06/18/2014, 8:50 AM

## 2014-06-18 NOTE — Evaluation (Addendum)
SLP Trach Team Note  Patient Details Name: Jerome [name] MRN: [id number] DOB: 08/18/1976   Trach team note:  Pt received #6 Shiley trach today per MD note - at this time SLP needs not identified, please order SLP if indicated    Jerome Burnetamara Dewey Neukam, MS [facility] SLP [phone]

## 2014-06-18 NOTE — Procedures (Signed)
Perc trach See full dictation Size 6 placed Husband consented Tolerated well  6 shiley  Lummie Montijo J. Amee Boothe, MD, FACP Pgr: [phone] [facility]  

## 2014-06-18 NOTE — Progress Notes (Signed)
STAFF NOTE: I, Dr Lavinia SharpsM Rajan Burgard have personally reviewed patient's available data, including medical history, events of note, physical examination and test results as part of my evaluation. I have discussed with resident/NP and other care providers such as pharmacist, RN and RRT.  In addition,  I personally evaluated patient and elicited key findings of acute respiratoyr failure due to ongoing encephalopahty which is not improved. Today is 13 days. Meets indication for trach; to be done by Dr Tyson AliasFeinstein later on. I updated his parents. .  Rest per NP/medical resident whose note is outlined above and that I agree with  The patient is critically ill with multiple organ systems failure and requires high complexity decision making for assessment and support, frequent evaluation and titration of therapies, application of advanced monitoring technologies and extensive interpretation of multiple databases.   Critical Care Time devoted to patient care services described in this note is  30  Minutes. This time reflects time of care of this signee Dr Kalman ShanMurali Phil Corti. This critical care time does not reflect procedure time, or teaching time or supervisory time of PA/NP/Med student/Med Resident etc but could involve care discussion time    Dr. Kalman ShanMurali Erin Obando, M.D., [facility] Pulmonary and Critical Care Medicine Staff Physician [facility] Pager: [phone], If no answer or between  15:00h - 7:00h: call [phone]  06/18/2014 12:24 PM

## 2014-06-18 NOTE — Procedures (Signed)
Bedside Tracheostomy Insertion Procedure Note   Patient Details:   Name: Jerome [name] DOB: 03/20/1976 MRN: [id number]  Procedure: Tracheostomy  Pre Procedure Assessment: ET Tube Size:7.5 ET Tube secured at lip (cm):25 Bite block in place: No Breath Sounds: Clear and Diminished  Post Procedure Assessment: BP 117/42  Pulse 78  Temp(Src) 99.7 F (37.6 C) (Core (Comment))  Resp 25  Ht 5\' 10"  (1.778 m)  Wt 229 lb 11.5 oz (104.2 kg)  BMI 32.96 kg/m2  SpO2 93% O2 sats: stable throughout Complications: No apparent complications Patient did tolerate procedure well Tracheostomy Brand:Shiley Tracheostomy Style:Cuffed Tracheostomy Size: 6.0 Tracheostomy Secured WUJ:[id number]:Sutures Tracheostomy Placement Confirmation:Trach cuff visualized and in place and Chest X ray ordered for placement    Leonard DowningFerguson, Karrisa Didio Steele 06/18/2014, 2:41 PM

## 2014-06-18 NOTE — Procedures (Signed)
Bronchoscopy  for Percutaneous  Tracheostomy  Name: Jerome BodyWilliam Berkel MRN: [id number] DOB: 09/20/1975 Procedure: Bronchoscopy for Percutaneous Tracheostomy Indications: Diagnostic evaluation of the airways In conjunction with: Dr. Tyson AliasFeinstein   Procedure Details Consent: Risks of procedure as well as the alternatives and risks of each were explained to the (patient/caregiver).  Consent for procedure obtained. Time Out: Verified patient identification, verified procedure, site/side was marked, verified correct patient position, special equipment/implants available, medications/allergies/relevent history reviewed, required imaging and test results available.  Performed  In preparation for procedure, patient was given 100% FiO2 and bronchoscope lubricated. Sedation: Benzodiazepines and Muscle relaxants  Airway entered and the following bronchi were examined: trachea   Procedures performed: Endotracheal Tube retracted in 2 cm increments. To eventually 16 cm. afte trach placed, placed through noted carina 4 cm below , no bleeding  Evaluation Hemodynamic Status: BP stable throughout; O2 sats: stable throughout Patient's Current Condition: stable Specimens:  None Complications: No apparent complications Patient did tolerate procedure well.   Brett CanalesSteve Minor ACNP [facility] Pager [phone] till 3 pm If no answer page [phone] 06/18/2014, 2:31 PM  performed with minor, supervised  Mcarthur RossettiDaniel J. Tyson AliasFeinstein, MD, FACP Pgr: [phone] [place] [facility]

## 2014-06-19 ENCOUNTER — Inpatient Hospital Stay (HOSPITAL_COMMUNITY): Payer: BC Managed Care – PPO

## 2014-06-19 DIAGNOSIS — Z930 Tracheostomy status: Secondary | ICD-10-CM

## 2014-06-19 DIAGNOSIS — J9620 Acute and chronic respiratory failure, unspecified whether with hypoxia or hypercapnia: Secondary | ICD-10-CM

## 2014-06-19 LAB — CBC
HCT: 24.9 % — ABNORMAL LOW (ref 39.0–52.0)
HEMOGLOBIN: 7.6 g/dL — AB (ref 13.0–17.0)
MCH: 25 pg — ABNORMAL LOW (ref 26.0–34.0)
MCHC: 30.5 g/dL (ref 30.0–36.0)
MCV: 81.9 fL (ref 78.0–100.0)
Platelets: 165 10*3/uL (ref 150–400)
RBC: 3.04 MIL/uL — AB (ref 4.22–5.81)
RDW: 21.5 % — ABNORMAL HIGH (ref 11.5–15.5)
WBC: 10.2 10*3/uL (ref 4.0–10.5)

## 2014-06-19 LAB — MAGNESIUM: MAGNESIUM: 1.9 mg/dL (ref 1.5–2.5)

## 2014-06-19 LAB — BASIC METABOLIC PANEL
ANION GAP: 11 (ref 5–15)
BUN: 20 mg/dL (ref 6–23)
CHLORIDE: 114 meq/L — AB (ref 96–112)
CO2: 19 meq/L (ref 19–32)
Calcium: 8.3 mg/dL — ABNORMAL LOW (ref 8.4–10.5)
Creatinine, Ser: 0.95 mg/dL (ref 0.50–1.35)
GFR calc Af Amer: >90 mL/min (ref >90)
GFR calc non Af Amer: >90 mL/min (ref >90)
Glucose, Bld: 148 mg/dL — ABNORMAL HIGH (ref 70–99)
Potassium: 4.4 mEq/L (ref 3.7–5.3)
SODIUM: 144 meq/L (ref 137–147)

## 2014-06-19 LAB — PHOSPHORUS: Phosphorus: 3.9 mg/dL (ref 2.3–4.6)

## 2014-06-19 LAB — PROCALCITONIN: PROCALCITONIN: 0.23 ng/mL

## 2014-06-19 LAB — LACTIC ACID, PLASMA: Lactic Acid, Venous: 1.6 mmol/L (ref 0.5–2.2)

## 2014-06-19 MED ORDER — HYDROMORPHONE BOLUS VIA INFUSION
0.5000 mg | INTRAVENOUS | Status: DC | PRN
  Filled 2014-06-19: qty 2

## 2014-06-19 MED ORDER — OXEPA PO LIQD
1000.0000 mL | ORAL | Status: DC
  Administered 2014-06-19 – 2014-06-23 (×5): 1000 mL
  Filled 2014-06-19 (×7): qty 1000

## 2014-06-19 MED ORDER — MAGNESIUM SULFATE 40 MG/ML IJ SOLN
2.0000 g | Freq: Once | INTRAMUSCULAR | Status: AC
  Administered 2014-06-19: 2 g via INTRAVENOUS
  Filled 2014-06-19: qty 50

## 2014-06-19 MED ORDER — QUETIAPINE FUMARATE 50 MG PO TABS
50.0000 mg | ORAL_TABLET | Freq: Every day | ORAL | Status: DC
Start: 2014-06-19 — End: 2014-06-21
  Administered 2014-06-19 – 2014-06-20 (×2): 50 mg via ORAL
  Filled 2014-06-19 (×2): qty 1

## 2014-06-19 MED ORDER — LEVETIRACETAM 100 MG/ML PO SOLN
250.0000 mg | Freq: Two times a day (BID) | ORAL | Status: DC
  Filled 2014-06-19 (×2): qty 2.5

## 2014-06-19 MED ORDER — MIDAZOLAM HCL 2 MG/2ML IJ SOLN
2.0000 mg | INTRAMUSCULAR | Status: DC | PRN
  Administered 2014-06-19 (×2): 2 mg via INTRAVENOUS
  Administered 2014-06-20: 4 mg via INTRAVENOUS
  Administered 2014-06-20: 2 mg via INTRAVENOUS
  Administered 2014-06-20: 6 mg via INTRAVENOUS
  Administered 2014-06-20 (×4): 4 mg via INTRAVENOUS
  Administered 2014-06-21: 1 mg via INTRAVENOUS
  Administered 2014-06-21: 2 mg via INTRAVENOUS
  Administered 2014-06-21: 3 mg via INTRAVENOUS
  Administered 2014-06-21 (×2): 2 mg via INTRAVENOUS
  Filled 2014-06-19 (×2): qty 4
  Filled 2014-06-19: qty 2
  Filled 2014-06-19: qty 4
  Filled 2014-06-19 (×2): qty 6
  Filled 2014-06-19 (×3): qty 4

## 2014-06-19 MED ORDER — LACTULOSE 10 GM/15ML PO SOLN
10.0000 g | Freq: Three times a day (TID) | ORAL | Status: DC
  Administered 2014-06-19 – 2014-06-22 (×9): 10 g
  Filled 2014-06-19 (×9): qty 15

## 2014-06-19 MED ORDER — PRO-STAT SUGAR FREE PO LIQD
30.0000 mL | ORAL | Status: DC
  Administered 2014-06-20 – 2014-06-24 (×5): 30 mL via ORAL
  Filled 2014-06-19 (×8): qty 30

## 2014-06-19 MED ORDER — CLONAZEPAM 0.5 MG PO TBDP: 2.0000 mg | ORAL_TABLET | Freq: Two times a day (BID) | ORAL | Status: DC

## 2014-06-19 MED ORDER — SODIUM CHLORIDE 0.9 % IV SOLN
0.5000 mg/h | INTRAVENOUS | Status: DC
  Administered 2014-06-19: 3 mg/h via INTRAVENOUS
  Filled 2014-06-19 (×2): qty 5

## 2014-06-19 MED ORDER — PRO-STAT SUGAR FREE PO LIQD: 40.0000 mL | Freq: Every morning | ORAL | Status: DC

## 2014-06-19 NOTE — Progress Notes (Signed)
95mL of 8510mcg/ml of Fentanyl wasted in sink. Witness by Gennette PacStephanie Russel, RN.

## 2014-06-19 NOTE — Progress Notes (Addendum)
NUTRITION FOLLOW UP  Intervention:    -Advance Oxepa by 10 ml every 8 hours to goal rate of 50 ml/hr with Pro-Stat 30 ml once daily -RD to follow  Nutrition Dx:   Inadequate oral intake related to inability to eat as evidenced by npo status; ongoing   Goal:   Diet advancement. Patient to meet estimated needs with meals and supplements.   New Goal: TF to meet >/= 90% of their estimated nutrition needs; not met with trickle feeds    Monitor:   TF tolerance, total protein/energy intake, labs, weights, diet order, sedation  Assessment:   10/12:  NPO on c pap. Currently being intubated.  Ate 1/2 omelet Saturday. Mother reports that patient has been afraid of eating for some time for fear of vomiting.  Patient last seen by RD 03/20/14. Diagnosed with severe malnutrition at that time due to weight loss and poor intake.  Weight has increased from 190 lbs in July to 208 lbs currently. Probably fluid gain.  Intake has been poor prior to admit.  Patient is currently intubated on ventilator support  MV: 11.6 L/min  Temp (24hrs), Avg:99 F (37.2 C), Min:98.3 F (36.8 C), Max:99.7 F (37.6 C)   Propofol: 20 ml/hr   10/13: Patient remains intubated on ventilator support MV: 11.2 L/min Temp (24hrs), Avg:99.5 F (37.5 C), Min:98.8 F (37.1 C), Max:100.4 F (38 C)  Propofol: 36.8 ml/hr  Vital AF 1.2 initiated at 20 ml/hr on 10/13 with goal rate of 60 ml/hr. RD to order adult enteral protocol.  MD noted pt increasingly agitated on ventilator. Propofol level increased, providing approximately 972 kcal/day. Low K levels being repleted. Weight increased 8 lbs since admit, likely d/t fluid Pt s/p thoracentesis with 500 ml fluid removal  10/15: -Pt's tube feeding on hold d/t elevated gastric residuals.  -Vital AF 1.2 had been running at 25 ml/hr. On 10/14, residuals > 500. OGT clamped. Residuals 350 ml on 10/15, bile looking appearance per RN -Per discussion with RN, pt's tube feed to be  held for 24 hours before restarting. Gastric residuals likely related to sedation, and bowel function will likely improve with sedation weaning. RN noted only minimal bowel sounds -Propofol was decreased earlier this morning to 19.8 ml/hr for WUA; and then was increased back to 36.8 ml/hr  -MD recommended f/u abd xray -Continue with current TF recommendation to be reinitiated per MD discretion, will monitor propofol levels/possible weaning and adjust as warranted -Total bili elevated, likely d/t cirrhosis of liver and hx of ETOH -Weight increased from 40 lb from admit, likely d/t fluid as pt with +2 generalized edema and +5 L fluid balance  10/16: -Received consult to initiate TF. Per discussion with RN, MD requesting trickle feeds at this time to assess tolerance, and increase to goal rate when able -Pharm noted pt with elevated triglycerides (> 400). Per RN, propofol is d/c and Versed will be used for sedation -Phosphorus slightly low at 2.1, K/Mg WNL  10/19: -Pt with elevated ammonia, lactulose ordered TID.  -Assisted in improving constipation; however resulted in ongoing loose stools. Plan to decrease lactulose and add H2 blockade -Oxepa to continue as 20 ml/hr trickle feeds until loose stools episodes decrease. 60 ml residuals per RN  -Pt being diuresed, -2 L fluid balance, and only 4 lbs increased from admit weight. -Mg repleted, Phos/Mg/K all WNL  10/21: -Pt receiving Oxepa at 30 ml/hr, which provides 1080 kcal (57% est kcal needs), and 45 gram protein (66% re-est protein needs) -  Ordered Pro-Stat 30 ml once daily to provide 100 kcal, 15 gram protein -36m residuals noted, continues with loose stools d/t lactulose. - Lactulose not decreased d/t continued hepatic encephalopathy. Modified protein requirements, and will increase as warranted -Phos/K//Mg WNL, glucose slightly elevated -Pt self extubated on 10/20, and was reintubated. Plan for trach placement on 10/22  10/23: -Pt s/p  trach placement on 10/22 -Continues to receive Oxepa at 30 ml/hr, 0 ml residuals -Continues with loose stools, rectal tube placed on 10/22. MD plan to decrease lactulose. Currently ordered TID  Height: Ht Readings from Last 1 Encounters:  06/10/14 5' 10"  (1.778 m)    Weight Status:   Wt Readings from Last 1 Encounters:  06/19/14 227 lb 1.2 oz (103 kg)  06/05/14 208 lb (94.5 kg)  Re-estimated needs:  Kcal: 1883 Protein: 60-78 gram (d/t encephalopathy, 100-115 gram when resolved) Fluid: 2.4L  Skin: +2 generalized edema, +3 perineal edema  Diet Order: NPO   Intake/Output Summary (Last 24 hours) at 06/19/14 1133 Last data filed at 06/19/14 1000  Gross per 24 hour  Intake 2534.68 ml  Output   5150 ml  Net -2615.32 ml    Last BM: 10/22   Labs:   Recent Labs Lab 06/17/14 0500 06/18/14 0416 06/18/14 1320 06/19/14 0430  NA 147 141 141 144  K 3.9 4.3 4.1 4.4  CL 116* 112 112 114*  CO2 21 19 18* 19  BUN 21 23 22 20   CREATININE 1.00 1.06 0.97 0.95  CALCIUM 8.6 8.3* 8.4 8.3*  MG 1.8 2.2  --  1.9  PHOS 3.6 3.7  --  3.9  GLUCOSE 147* 133* 138* 148*    CBG (last 3)   Recent Labs  06/18/14 0736 06/18/14 1201 06/18/14 1613  GLUCAP 126* 130* 118*    Scheduled Meds: . antiseptic oral rinse  7 mL Mouth Rinse QID  . chlorhexidine  15 mL Mouth Rinse BID  . cloNIDine  0.1 mg Oral TID  . feeding supplement (OXEPA)  1,000 mL Per Tube Q24H  . [START ON 06/20/2014] feeding supplement (PRO-STAT SUGAR FREE 64)  40 mL Per Tube q morning - 101V . folic acid  1 mg Intravenous Daily  . free water  200 mL Per Tube Q12H  . furosemide  60 mg Intravenous 3 times per day  . insulin aspart  0-9 Units Subcutaneous 6 times per day  . lactulose  10 g Per Tube TID  . magnesium sulfate 1 - 4 g bolus IVPB  2 g Intravenous Once  . potassium chloride  20 mEq Per Tube BID  . propofol  5-80 mcg/kg/min Intravenous Once  . QUEtiapine  50 mg Oral QHS  . ranitidine  150 mg Oral QHS  .  rifaximin  400 mg Oral TID  . sodium chloride  3 mL Intravenous Q12H  . thiamine  100 mg Oral Daily    Continuous Infusions: . sodium chloride 10 mL/hr at 06/19/14 0600  . dexmedetomidine 1.2 mcg/kg/hr (06/19/14 0802)  . HYDROmorphone    . midazolam (VERSED) infusion 5 mg/hr (06/19/14 1100)    SAtlee AbideMPheasant RunLDN Clinical Dietitian PCBSWH:[phone]

## 2014-06-19 NOTE — Op Note (Signed)
NAME:  Jerome [name], Reilly             ACCOUNT NO.:  [id number]  MEDICAL RECORD NO.:  [id number]  LOCATION:  1229                         FACILITY[facility]  [facility]  PHYSICIAN:  Nelda Bucksaniel J Vittoria Noreen, MD DATE OF BIRTH:  10-30-1975  DATE OF PROCEDURE: DATE OF DISCHARGE:                              OPERATIVE REPORT   PROCEDURE:  Percutaneous tracheostomy.  Consent was obtained from the patient's family, fully aware risks and benefits of the procedure including infection, bleeding, pneumothorax, and death.  PREOPERATIVE DIAGNOSIS:  Alcoholism, delirium, and recurrent respiratory failure.  POSTOPERATIVE DIAGNOSIS:  Status post tracheostomy secondary to recurrent respiratory failure.  The bronchoscope was placed through the endotracheal tube and backed up to fundus approximately 16 cm.  The patient was prepped with chlorhexidine preparation over the 2nd and 3rd endotracheal space and the entire neck.  A 6 mL of lidocaine plus epinephrine injected over the operative site.  A 1 cm vertical incision made, dissection made down the tracheal planes.  There was some minimal amount of thyroid tissue. Blood loss of the procedure was less than 5 mL.  I placed an 18-gauge needle over white catheter sheath into the airway successfully.  Needle was removed.  White catheter sheath remained.  Placed a wire through the white catheter sheath and white catheter sheath was removed.  A 14- French punch dilator placed in and out of the airway.  A progressive Rhino dilator was placed in and out of the airway to approximately 30- [place]French.  A 26-French dilator __________ tracheostomy was placed over the glider and wire successfully into the airway and removed except for the tracheostomy.  I then placed a bronchoscope through the new tracheostomy, the carina approximately 3-4 cm below without any posterior wall injury.  No active bleeding.  The 4-0 monofilament sutures were placed around the tracheostomy.  The patient  tolerated procedure quite well.  This patient is to follow up in our Percutaneous Tracheostomy Clinic by calling [phone].     Nelda Bucksaniel J Roni Scow, MD     DJF/MEDQ  D:  06/18/2014  T:  06/18/2014  Job:  [id number]

## 2014-06-19 NOTE — Progress Notes (Signed)
Dr. Marchelle Gearingamaswamy notified of patient's progressed Temperature to 100.7. Ordered for blood cultures, urine culture.

## 2014-06-19 NOTE — Progress Notes (Signed)
PULMONARY / CRITICAL CARE MEDICINE  Name: Jerome [name] MRN: [id number] DOB:  15-Nov-1975 ADMISSION DATE: 06/05/2014  CONSULTATION DATE: 06/08/2014  REFERRING MD : TRH  CHIEF COMPLAINT: Respiratory Failure  INITIAL PRESENTATION:  38 yo male with hx of ETOH presented with dyspnea/fatigue from profound anemia (Hb 4.5), and received multiple transfusions of PRBC.  Developed progressive respiratory failure with Rt > Lt pulmonary infiltrates, and PCCM assumed care in ICU.  SIGNIFICANT EVENTS:  10/09 admitted; PRBC x3, GI consulted 10/10 PRBC x1 10/11 PRBC x1; new onset PNA on chest x ray 10/12: acute hypoxic respiratory failure overnight requiring BiPAP. Alcohol withdrawal requiring precedex gtt. Intubated for decreasing LOC and increasing 02 needs and worsening PNA on CXR.  10/13 bronchoscopy >> respiratory secretions from RLL, friable airways  - 10/13 Rt thoracentesis > 500 ml fluid: transudate  10/14 gastric residuals  - 10/14 Echo >> EF 55 to 60%, PAS 33 mmHg 10/16 acidosis ?from diprivan >> change to versed gtt  06/13/14:  Significant intermittent agitation.  On 45% fio2.hick tenacious ET tube secretions + .  on fent and versed 6mg /h gtt. START LACTULOSE AND XIFAXAN 10/19 fio2 50 % and peep 12 on ARDS protocol 10/20 fio2 40% 8 peep. Slow progress 10/20 self extubation with urgent reintubation and fall off bed to floor 10/21 plan for trach tomorrow Thursday 10/22 trached per df 10/23 agitation remains an issue.  Intake/Output Summary (Last 24 hours) at 06/19/14 1002 Last data filed at 06/19/14 0900  Gross per 24 hour  Intake 2524.68 ml  Output   4975 ml  Net -2450.32 ml    SUBJECTIVE/OVERNIGHT/INTERVAL HX Sedated or agitated. - I/0 with increased lasix  New fever 100.20F   PHYSICAL EXAM:  Vital signs in last 24 hours: Temp:  [98.8 F (37.1 C)-99.9 F (37.7 C)] 99.9 F (37.7 C) (10/23 0800) Pulse Rate:  [65-96] 77 (10/23 0800) Resp:  [15-25] 17 (10/23 0800) BP:  (105-146)/(42-82) 116/49 mmHg (10/23 0800) SpO2:  [89 %-97 %] 93 % (10/23 0800) FiO2 (%):  [40 %] 40 % (10/23 0830) Weight:  [227 lb 1.2 oz (103 kg)] 227 lb 1.2 oz (103 kg) (10/23 0300) Last BM Date: 06/18/14 VENT SETTINGS: Vent Mode:  [-] PRVC FiO2 (%):  [40 %] 40 % Set Rate:  [16 bmp-25 bmp] 16 bmp Vt Set:  [580 mL] 580 mL PEEP:  [5 cmH20] 5 cmH20 Plateau Pressure:  [14 cmH20-18 cmH20] 14 cmH20 I/O: Intake/Output     10/22 0701 - 10/23 0700 10/23 0701 - 10/24 0700   I.V. (mL/kg) 2050.9 (19.9) 177.3 (1.7)   NG/GT 480 200   IV Piggyback     Total Intake(mL/kg) 2530.9 (24.6) 377.3 (3.7)   Urine (mL/kg/hr) 3325 (1.3) 1675 (5.4)   Stool 250 (0.1)    Total Output 3575 1675   Net -1044.1 -1297.7        Stool Occurrence  1 x     PHYSICAL EXAMINATION:  General: no distress, heavily sedated but agitated when sedation decreased. Neuro: RASS -2 and fluctutates to +3 when sedation decreased.  HEENT: trach in place, no jvd/LAN, left I J cvl Cardiovascular: regular HSR RRR Lungs: scattered rhonchi, decreased in bases Abdomen: Soft, non-tender, + bowel sounds, TF's resumed Musculoskeletal: no edema Skin: warm/dry  Lab Results: PULMONARY  Recent Labs Lab 06/14/14 1202 06/15/14 1106 06/17/14 1002 06/18/14 0528  PHART 7.420 7.376 7.366 7.349*  PCO2ART 32.3* 39.3 34.2* 33.8*  PO2ART 100.0 132.0* 72.3* 69.5*  HCO3 20.6 22.5 19.0* 17.8*  TCO2  19.4 21.2 18.2 17.1  O2SAT 98.1 99.3 93.8 90.6    CBC  Recent Labs Lab 06/17/14 0500 06/18/14 0416 06/19/14 0430  HGB 8.1* 7.9* 7.6*  HCT 27.0* 25.9* 24.9*  WBC 10.7* 9.0 10.2  PLT 228 187 165    COAGULATION  Recent Labs Lab 06/14/14 0453 06/18/14 0416  INR 1.40 1.52*    CARDIAC  No results found for this basename: TROPONINI,  in the last 168 hours No results found for this basename: PROBNP,  in the last 168 hours   CHEMISTRY  Recent Labs Lab 06/15/14 0455 06/16/14 0530 06/17/14 0500 06/18/14 0416  06/18/14 1320 06/19/14 0430  NA 148* 152* 147 141 141 144  K 4.1 4.4 3.9 4.3 4.1 4.4  CL 116* 119* 116* 112 112 114*  CO2 21 22 21 19  18* 19  GLUCOSE 117* 132* 147* 133* 138* 148*  BUN 15 19 21 23 22 20   CREATININE 1.28 1.06 1.00 1.06 0.97 0.95  CALCIUM 8.6 8.8 8.6 8.3* 8.4 8.3*  MG 1.7 2.0 1.8 2.2  --  1.9  PHOS 3.0 3.9 3.6 3.7  --  3.9   Estimated Creatinine Clearance: 129.2 ml/min (by C-G formula based on Cr of 0.95).   LIVER  Recent Labs Lab 06/14/14 0453 06/16/14 0530 06/18/14 0416  AST  --  88*  --   ALT  --  15  --   ALKPHOS  --  176*  --   BILITOT  --  2.4*  --   PROT  --  7.1  --   ALBUMIN  --  2.3*  --   INR 1.40  --  1.52*     INFECTIOUS No results found for this basename: LATICACIDVEN, PROCALCITON,  in the last 168 hours   ENDOCRINE CBG (last 3)   Recent Labs  06/18/14 0736 06/18/14 1201 06/18/14 1613  GLUCAP 126* 130* 118*         IMAGING x48h Dg Chest Port 1 View  06/19/2014   CLINICAL DATA:  Respiratory failure, check tracheostomy position  EXAM: PORTABLE CHEST - 1 VIEW  COMPARISON:  06/18/2014  FINDINGS: Tracheostomy tube, feeding catheter and left jugular central venous line are again identified and unchanged. The cardiac shadow is again mildly prominent size. Central vascular congestion is again noted with increasing bibasilar consolidation. Likely pleural effusions are present as well.  IMPRESSION: Persistent vascular congestion with increasing bibasilar consolidation.   Electronically Signed   By: Alcide CleverMark  Lukens M.D.   On: 06/19/2014 07:28   Dg Chest Port 1 View  06/18/2014   CLINICAL DATA:  Hypertension.  Diabetes.  Respiratory failure.  EXAM: PORTABLE CHEST - 1 VIEW  COMPARISON:  06/18/2014.  FINDINGS: Enlarged cardiac silhouette. Interval tracheostomy tube in satisfactory position. Feeding tube extending into the stomach. Left jugular catheter tip in the superior vena cava. Prominent interstitial markings and bilateral alveolar  opacities with improvement. Decreased bilateral pleural fluid.  IMPRESSION: Remain changes of congestive heart failure.   Electronically Signed   By: Gordan PaymentSteve  Reid M.D.   On: 06/18/2014 15:06   Dg Chest Port 1 View  06/18/2014   CLINICAL DATA:  Respiratory failure.  Endotracheal tube positioning.  EXAM: PORTABLE CHEST - 1 VIEW  COMPARISON:  06/17/2014  FINDINGS: The patient is rotated to the Right on today's radiograph, reducing diagnostic sensitivity and specificity. Endotracheal tube tip is 2 cm above the carina. Left IJ line tip: Upper SVC.  Feeding tube enters the stomach. Coiled wires project over the chest.  Increased bilateral lower lobe airspace opacities. Cannot exclude layering pleural effusions. Rib deformities from old healed fractures, left greater than right. Stably enlarged cardiopericardial silhouette.  IMPRESSION: 1. Increased bibasilar airspace opacity likely representing combination of atelectasis and layering pleural effusions. Strictly speaking, multi lobar pneumonia is not excluded.   Electronically Signed   By: Herbie BaltimoreWalt  Liebkemann M.D.   On: 06/18/2014 07:35       ASSESSMENT / PLAN:  PULMONARY  OETT 10/12 >>10/20 self extubation, re intubated 10/20>>10/22, Trach #6 per DF>>  A:   - Acute on chronic hypoxic respiratory failure 2nd to PNA, and b/l pleural effusions (transudate on Rt).   - Prolonged Mechanical Ventilation since 06/18/14  P:  SBT as tolerated Diuresis as tolerated(see renal) See neuro section   CARDIOVASCULAR  A:  Hypotension 2nd to sedation meds.(resolved off pressors) Hypervolemia.   P:  Lasix  Since 06/12/14   RENAL  Lab Results  Component Value Date   CREATININE 0.95 06/19/2014   CREATININE 0.97 06/18/2014   CREATININE 1.06 06/18/2014    Recent Labs Lab 06/18/14 0416 06/18/14 1320 06/19/14 0430  NA 141 141 144    A:  Stage III CKD. Non gap metabolic acidosis ?from diprivan. 06/12/14  Hypernatremia (resolving)   P:   Lasix Negative balance; kvo fluids  GASTROINTESTINAL  A:  Severe protein calorie malnutrition.  ETOH with cirrhosis.  - Difficulty tolerating tube feeds. - persists 06/16/14  -  - constipation resolved 06/14/14 after lactulose.              -10/23 diarrhea .   P:  Tube feeds H2 blockade (no ppi due to lactulose diarrhea)  . Diarrhea 10/23 therefore decrease lactulose Continue xifaxan ; tiotal 10 days from 06/03/14  HEMATOLOGIC  A:  Anemia from chronic GI bleeding >> stable at present. P:  F/u CBC Transfuse for hgb <7.0   INFECTIOUS  A:  Right > left PNA. HCAP vs aspiration.  Flu negative. S/p cefepime 7d ending 06/14/14 MRSA swab positive.   - new fever 06/19/14  P:  Pan culture  Sepsis biomarkers Monitor off abx   ENDOCRINE  A:  DMII with nephropathy. 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  - 06/13/14: Started Lactulose and Xifaxan 06/13/14 (off diprivan 10/16 due to acidosis)   -- . 06/19/14 - still agitated  P:  RASS goal -2 Change fentanyl gtt to dilaudid gtt (principle of opioid rotation) \Continuet versed gtt 10/19  Continue  LACTULOSE and XIFAXAN since  06/13/14 due to possible heaptic encephalopathy , Continue precedex gtt Continue clonidine Continue seroquel since 05/29/14 (check qtc prn) Continue thiamine, folic acid Foot drop boots  DC Klonopin Avoid keppra  FAMILY Parents updated at bedside 10/23  Interdisciplinary meeting : held 06/14/14 at bedside with mom, dad, and sister and RN Amy: explained likely heading into prolonged mech vent state, chronic critical illness state and implications long term. Suggested meets indication for trach and need for LTACH. They seem inclined to move forward if does not wean off vent  next few to several days  He is improving slowly. Suspect with trach and oral sedation he will wean from vent.   Brett CanalesSteve Minor ACNP [facility] PCCM Pager [phone] till 3 pm If no answer page [phone] 06/19/2014,  10:02 AM     STAFF NOTE: I, Dr Lavinia Sharps have personally reviewed patient's available data, including medical history, events of note, physical examination and test results as part of my evaluation. I have discussed with resident/NP and other care providers such as pharmacist, RN and RRT.  In addition,  I personally evaluated patient and elicited key findings of acute on chronic resp failure - prolonged mech vent. Currently vent dependent due to acute encephalopaty - a major issue despite so many med changes. Made changes in neuro section  -which are changes made by MD in the NP note. Will try  Opioid rotation. ASked care mgmt consult for LTAC. I updated parents myself.  Rest per NP/medical resident whose note is outlined above and that I agree with  The patient is critically ill with multiple organ systems failure and requires high complexity decision making for assessment and support, frequent evaluation and titration of therapies, application of advanced monitoring technologies and extensive interpretation of multiple databases.   Critical Care Time devoted to patient care services described in this note is  35  Minutes. This time reflects time of care of this signee Dr Kalman Shan. This critical care time does not reflect procedure time, or teaching time or supervisory time of PA/NP/Med student/Med Resident etc but could involve care discussion time    Dr. Kalman Shan, M.D., [facility].C.P Pulmonary and Critical Care Medicine Staff Physician [facility] [place] [facility] Pager: [phone], If no answer or between  15:00h - 7:00h: call [phone]  06/19/2014 11:31 AM

## 2014-06-20 ENCOUNTER — Inpatient Hospital Stay (HOSPITAL_COMMUNITY): Payer: BC Managed Care – PPO

## 2014-06-20 DIAGNOSIS — J9620 Acute and chronic respiratory failure, unspecified whether with hypoxia or hypercapnia: Secondary | ICD-10-CM

## 2014-06-20 DIAGNOSIS — D62 Acute posthemorrhagic anemia: Secondary | ICD-10-CM

## 2014-06-20 LAB — PHOSPHORUS: PHOSPHORUS: 4.4 mg/dL (ref 2.3–4.6)

## 2014-06-20 LAB — GLUCOSE, CAPILLARY
GLUCOSE-CAPILLARY: 141 mg/dL — AB (ref 70–99)
GLUCOSE-CAPILLARY: 144 mg/dL — AB (ref 70–99)
GLUCOSE-CAPILLARY: 145 mg/dL — AB (ref 70–99)
GLUCOSE-CAPILLARY: 145 mg/dL — AB (ref 70–99)
GLUCOSE-CAPILLARY: 145 mg/dL — AB (ref 70–99)
GLUCOSE-CAPILLARY: 154 mg/dL — AB (ref 70–99)
Glucose-Capillary: 146 mg/dL — ABNORMAL HIGH (ref 70–99)
Glucose-Capillary: 154 mg/dL — ABNORMAL HIGH (ref 70–99)
Glucose-Capillary: 157 mg/dL — ABNORMAL HIGH (ref 70–99)
Glucose-Capillary: 158 mg/dL — ABNORMAL HIGH (ref 70–99)
Glucose-Capillary: 174 mg/dL — ABNORMAL HIGH (ref 70–99)
Glucose-Capillary: 186 mg/dL — ABNORMAL HIGH (ref 70–99)
Glucose-Capillary: 204 mg/dL — ABNORMAL HIGH (ref 70–99)
Glucose-Capillary: 216 mg/dL — ABNORMAL HIGH (ref 70–99)

## 2014-06-20 LAB — CBC
HEMATOCRIT: 24.3 % — AB (ref 39.0–52.0)
HEMOGLOBIN: 7.4 g/dL — AB (ref 13.0–17.0)
MCH: 25 pg — ABNORMAL LOW (ref 26.0–34.0)
MCHC: 30.5 g/dL (ref 30.0–36.0)
MCV: 82.1 fL (ref 78.0–100.0)
Platelets: 157 10*3/uL (ref 150–400)
RBC: 2.96 MIL/uL — ABNORMAL LOW (ref 4.22–5.81)
RDW: 22.2 % — ABNORMAL HIGH (ref 11.5–15.5)
WBC: 11.9 10*3/uL — AB (ref 4.0–10.5)

## 2014-06-20 LAB — PROCALCITONIN: Procalcitonin: 0.22 ng/mL

## 2014-06-20 LAB — URINALYSIS, ROUTINE W REFLEX MICROSCOPIC
Bilirubin Urine: NEGATIVE
GLUCOSE, UA: NEGATIVE mg/dL
Hgb urine dipstick: NEGATIVE
Ketones, ur: NEGATIVE mg/dL
Leukocytes, UA: NEGATIVE
Nitrite: NEGATIVE
PROTEIN: NEGATIVE mg/dL
SPECIFIC GRAVITY, URINE: 1.012 (ref 1.005–1.030)
Urobilinogen, UA: 0.2 mg/dL (ref 0.0–1.0)
pH: 5.5 (ref 5.0–8.0)

## 2014-06-20 LAB — BASIC METABOLIC PANEL
Anion gap: 9 (ref 5–15)
BUN: 20 mg/dL (ref 6–23)
CALCIUM: 8.5 mg/dL (ref 8.4–10.5)
CO2: 23 mEq/L (ref 19–32)
CREATININE: 0.88 mg/dL (ref 0.50–1.35)
Chloride: 111 mEq/L (ref 96–112)
GFR calc non Af Amer: >90 mL/min (ref >90)
Glucose, Bld: 143 mg/dL — ABNORMAL HIGH (ref 70–99)
Potassium: 4.2 mEq/L (ref 3.7–5.3)
Sodium: 143 mEq/L (ref 137–147)

## 2014-06-20 LAB — LACTIC ACID, PLASMA: LACTIC ACID, VENOUS: 1.2 mmol/L (ref 0.5–2.2)

## 2014-06-20 LAB — CLOSTRIDIUM DIFFICILE BY PCR: CDIFFPCR: NEGATIVE

## 2014-06-20 LAB — URINE CULTURE
Colony Count: NO GROWTH
Culture: NO GROWTH
SPECIAL REQUESTS: NORMAL

## 2014-06-20 LAB — MAGNESIUM: Magnesium: 1.9 mg/dL (ref 1.5–2.5)

## 2014-06-20 MED ORDER — MAGNESIUM SULFATE 50 % IJ SOLN: 2.0000 g | Freq: Once | INTRAVENOUS | Status: DC

## 2014-06-20 MED ORDER — ACETAMINOPHEN 160 MG/5ML PO SOLN
325.0000 mg | Freq: Four times a day (QID) | ORAL | Status: DC | PRN
Start: 2014-06-20 — End: 2014-07-08
  Administered 2014-06-20 – 2014-07-07 (×11): 325 mg
  Filled 2014-06-20 (×11): qty 20.3

## 2014-06-20 MED ORDER — MAGNESIUM SULFATE 40 MG/ML IJ SOLN
2.0000 g | Freq: Once | INTRAMUSCULAR | Status: AC
  Administered 2014-06-20: 2 g via INTRAVENOUS
  Filled 2014-06-20: qty 50

## 2014-06-20 MED ORDER — FENTANYL CITRATE 0.05 MG/ML IJ SOLN
50.0000 ug | Freq: Four times a day (QID) | INTRAMUSCULAR | Status: DC
  Administered 2014-06-20 (×2): 50 ug via INTRAVENOUS
  Filled 2014-06-20 (×2): qty 2

## 2014-06-20 MED ORDER — FENTANYL 100 MCG/HR TD PT72
100.0000 ug | MEDICATED_PATCH | TRANSDERMAL | Status: DC
  Administered 2014-06-20: 100 ug via TRANSDERMAL
  Filled 2014-06-20: qty 1

## 2014-06-20 MED ORDER — CLONAZEPAM 0.1 MG/ML ORAL SUSPENSION
1.0000 mg | Freq: Two times a day (BID) | ORAL | Status: DC
  Filled 2014-06-20: qty 10

## 2014-06-20 MED ORDER — FENTANYL CITRATE 0.05 MG/ML IJ SOLN
100.0000 ug | INTRAMUSCULAR | Status: DC | PRN
  Administered 2014-06-20 – 2014-06-26 (×54): 100 ug via INTRAVENOUS
  Filled 2014-06-20 (×58): qty 2

## 2014-06-20 MED ORDER — DEXTROSE 5 % IV SOLN
2.0000 g | Freq: Three times a day (TID) | INTRAVENOUS | Status: DC
  Administered 2014-06-20 – 2014-06-23 (×9): 2 g via INTRAVENOUS
  Filled 2014-06-20 (×10): qty 2

## 2014-06-20 MED ORDER — CLONIDINE HCL 0.1 MG PO TABS
0.2000 mg | ORAL_TABLET | Freq: Three times a day (TID) | ORAL | Status: DC
  Administered 2014-06-20 – 2014-06-29 (×28): 0.2 mg via ORAL
  Filled 2014-06-20 (×29): qty 2

## 2014-06-20 MED ORDER — FENTANYL CITRATE 0.05 MG/ML IJ SOLN
100.0000 ug | Freq: Four times a day (QID) | INTRAMUSCULAR | Status: DC
  Administered 2014-06-20: 100 ug via INTRAVENOUS
  Filled 2014-06-20: qty 2

## 2014-06-20 MED ORDER — DEXMEDETOMIDINE HCL IN NACL 400 MCG/100ML IV SOLN
0.0000 ug/kg/h | INTRAVENOUS | Status: DC
  Administered 2014-06-20 – 2014-06-21 (×8): 2 ug/kg/h via INTRAVENOUS
  Filled 2014-06-20: qty 200
  Filled 2014-06-20 (×6): qty 100

## 2014-06-20 MED ORDER — LORAZEPAM 1 MG PO TABS
2.0000 mg | ORAL_TABLET | Freq: Four times a day (QID) | ORAL | Status: DC
  Administered 2014-06-20 (×2): 2 mg via ORAL
  Filled 2014-06-20: qty 2

## 2014-06-20 MED ORDER — LORAZEPAM 1 MG PO TABS
2.0000 mg | ORAL_TABLET | ORAL | Status: DC
  Filled 2014-06-20: qty 2

## 2014-06-20 MED ORDER — CLONAZEPAM 0.5 MG PO TBDP
1.0000 mg | ORAL_TABLET | Freq: Two times a day (BID) | ORAL | Status: DC
  Administered 2014-06-20: 1 mg via ORAL
  Filled 2014-06-20: qty 2

## 2014-06-20 NOTE — Progress Notes (Addendum)
Wake Up Assessment and weaning:  Pt was slowly titrated off of Dilaudid and Versed gtts this am prior to placing on weaning trial.  Received order to increase Precedex up to 142mcg/kg/hr (max) to help with agitation.  Pt weaned on PS 45% with +8 peep (per ARDS protocol)  for 1 hr and 40 minutes.  Pt intermittently became restless and agitated, needing frequent reassurance from parents and this RN.  Pt had no difficulty with tachypnea and maintained tidal volumes of approximately 500 while on PSV weaning.  Eventually ,this RN needed to give additional Versed 4mg  IVP to help calm patient.   Pt squirms down in bed, needing to be pulled up frequently.  Parents are calm and supportive at the bedside.  All nursing care and updated given to them, also encouraged them to ask questions.  Much teaching provided and welcomed.  Continued close monitoring.  Ok EdwardsSheila Emalie Mcwethy, RN  [phone]: [facility] spoke with Dr. Molli KnockYacoub.  Discussed patients increasing agitation as the shift progresses and need for more sedation.  New orders received.  Will start Fentanyl 100mcg patch and add Klonipin as ordered for withdrawal symptoms. Fentanyl has been increased to 100mcg IV prn.  Ok EdwardsSheila Gracy Ehly, RN

## 2014-06-20 NOTE — Progress Notes (Signed)
ANTIBIOTIC CONSULT NOTE - Initial consult  Pharmacy Consult for ceftazidime Indication: fever in setting of recently treated HCAP  No Known Allergies  Patient Measurements: Height: 5\' 10"  (177.8 cm) Weight: 225 lb 15.5 oz (102.5 kg) IBW/kg (Calculated) : 73   Vital Signs: Temp: 102 F (38.9 C) (10/24 1500) BP: 138/67 mmHg (10/24 1500) Pulse Rate: 100 (10/24 1500) Intake/Output from previous day: 10/23 0701 - 10/24 0700 In: 2618.4 [I.V.:1320.6; NG/GT:1297.8] Out: 7675 [Urine:6275; Stool:1400] Intake/Output from this shift: Total I/O In: 698.7 [I.V.:298.7; NG/GT:350; IV Piggyback:50] Out: 1300 [Urine:1300]  Labs:  Recent Labs  06/18/14 0416 06/18/14 1320 06/19/14 0430 06/20/14 0600  WBC 9.0  --  10.2 11.9*  HGB 7.9*  --  7.6* 7.4*  PLT 187  --  165 157  CREATININE 1.06 0.97 0.95 0.88   Estimated Creatinine Clearance: 139.2 ml/min (by C-G formula based on Cr of 0.88).    Microbiology: Recent Results (from the past 720 hour(s))  MRSA PCR SCREENING     Status: Abnormal   Collection Time    06/05/14  6:45 PM      Result Value Ref Range Status   MRSA by PCR POSITIVE (*) NEGATIVE Final   Comment:            The GeneXpert MRSA Assay (FDA     approved for NASAL specimens     only), is one component of a     comprehensive MRSA colonization     surveillance program. It is not     intended to diagnose MRSA     infection nor to guide or     monitor treatment for     MRSA infections.     RESULT CALLED TO, READ BACK BY AND VERIFIED WITH:     C.AYERS,RN AT 2213 ON 06/05/14 BY SHEAW  CULTURE, EXPECTORATED SPUTUM-ASSESSMENT     Status: None   Collection Time    06/07/14 10:01 PM      Result Value Ref Range Status   Specimen Description SPUTUM   Final   Special Requests NONE   Final   Sputum evaluation     Final   Value: MICROSCOPIC FINDINGS SUGGEST THAT THIS SPECIMEN IS NOT REPRESENTATIVE OF LOWER RESPIRATORY SECRETIONS. PLEASE RECOLLECT.   Report Status 06/07/2014  FINAL   Final  CULTURE, BAL-QUANTITATIVE     Status: None   Collection Time    06/09/14 10:25 AM      Result Value Ref Range Status   Specimen Description BRONCHIAL ALVEOLAR LAVAGE   Final   Special Requests NONE   Final   Gram Stain     Final   Value: RARE WBC PRESENT, PREDOMINANTLY MONONUCLEAR     NO SQUAMOUS EPITHELIAL CELLS SEEN     NO ORGANISMS SEEN     Performed at [facility]   Colony Count     Final   Value: NO GROWTH     [facility]formed at [facility]   Culture     Final   Value: NO GROWTH 2 DAYS     Performed at [facility]   Report Status 06/11/2014 FINAL   Final  BODY FLUID CULTURE     Status: None   Collection Time    06/09/14 10:34 AM      Result Value Ref Range Status   Specimen Description THORACENTESIS PLEURAL   Final   Special Requests NONE   Final   Gram Stain     Final   Value: NO  WBC SEEN     NO ORGANISMS SEEN     Performed at [facility]   Culture     Final   Value: NO GROWTH 3 DAYS     Performed at [facility]   Report Status 06/12/2014 FINAL   Final  CULTURE, RESPIRATORY (NON-EXPECTORATED)     Status: None   Collection Time    06/10/14  8:30 AM      Result Value Ref Range Status   Specimen Description TRACHEAL ASPIRATE   Final   Special Requests NONE   Final   Gram Stain     Final   Value: MODERATE WBC PRESENT,BOTH PMN AND MONONUCLEAR     NO SQUAMOUS EPITHELIAL CELLS SEEN     NO ORGANISMS SEEN     Performed at [facility]   Culture     Final   Value: Non-Pathogenic Oropharyngeal-type Flora Isolated.     Performed at [facility]   Report Status 06/12/2014 FINAL   Final  URINE CULTURE     Status: None   Collection Time    06/19/14 12:15 PM      Result Value Ref Range Status   Specimen Description URINE, CATHETERIZED   Final   Special Requests Normal   Final   Culture  Setup Time     Final   Value: 06/19/2014 15:13     Performed at [facility]   Colony Count     Final    Value: NO GROWTH     Performed at [facility]   Culture     Final   Value: NO GROWTH     Performed at [facility]   Report Status 06/20/2014 FINAL   Final  CLOSTRIDIUM DIFFICILE BY PCR     Status: None   Collection Time    06/19/14  2:28 PM      Result Value Ref Range Status   C difficile by pcr NEGATIVE  NEGATIVE Final   Comment: Performed [facility]    Anti-infectives   Start     Dose/Rate Route Frequency Ordered Stop   06/20/14 1500  cefTAZidime (FORTAZ) 2 g in dextrose 5 % 50 mL IVPB     2 g 100 mL/hr over 30 Minutes Intravenous Every 8 hours 06/20/14 1453     06/13/14 1600  rifaximin (XIFAXAN) tablet 400 mg     400 mg Oral 3 times daily 06/13/14 1339 06/23/14 1559   06/12/14 0200  vancomycin (VANCOCIN) 1,750 mg in sodium chloride 0.9 % 500 mL IVPB  Status:  Discontinued     1,750 mg 250 mL/hr over 120 Minutes Intravenous Every 8 hours 06/11/14 1909 06/12/14 1027   06/11/14 1930  vancomycin (VANCOCIN) 500 mg in sodium chloride 0.9 % 100 mL IVPB     500 mg 100 mL/hr over 60 Minutes Intravenous  Once 06/11/14 1911 06/11/14 2230   06/10/14 0200  vancomycin (VANCOCIN) 1,250 mg in sodium chloride 0.9 % 250 mL IVPB  Status:  Discontinued     1,250 mg 166.7 mL/hr over 90 Minutes Intravenous Every 8 hours 06/09/14 1940 06/11/14 1909   06/08/14 1000  vancomycin (VANCOCIN) IVPB 1000 mg/200 mL premix  Status:  Discontinued     1,000 mg 200 mL/hr over 60 Minutes Intravenous Every 8 hours 06/08/14 0727 06/09/14 1940   06/08/14 0800  ceFEPIme (MAXIPIME) 1 g in dextrose 5 % 50 mL IVPB  Status:  Discontinued  1 g 100 mL/hr over 30 Minutes Intravenous Every 8 hours 06/08/14 0725 06/14/14 1139   06/07/14 1230  levofloxacin (LEVAQUIN) IVPB 750 mg  Status:  Discontinued     750 mg 100 mL/hr over 90 Minutes Intravenous Daily 06/07/14 1123 06/08/14 0711      Assessment: 38 y/o M with progressive respiratory failure in setting of EtOH withdrawal and hepatic  encephalopathy, was treated for HCAP with empiric antibiotics - completed course on 10/18.  Today is febrile (102) and CXR shows LLL collapse / consolidation.  Orders received to begin empiric ceftazidime with pharmacy dosing assistance.  Goal of Therapy:  Appropriate antibiotic dosing for renal function; eradication of infection.   Plan:  1. Ceftazidime 2 grams IV q8h 2. Follow culture results, clinical course.  Elie Goody, PharmD, BCPS Pager: [phone] 06/20/2014  3:20 PM

## 2014-06-20 NOTE — Progress Notes (Signed)
PULMONARY / CRITICAL CARE MEDICINE  Name: Jerome [name] MRN: [id number] DOB:  10/28/1975 ADMISSION DATE: 06/05/2014  CONSULTATION DATE: 06/08/2014  REFERRING MD : TRH  CHIEF COMPLAINT: Respiratory Failure  INITIAL PRESENTATION:  38 yo male with hx of ETOH presented with dyspnea/fatigue from profound anemia (Hb 4.5), and received multiple transfusions of PRBC.  Developed progressive respiratory failure with Rt > Lt pulmonary infiltrates, and PCCM assumed care in ICU.  SIGNIFICANT EVENTS:  10/09 admitted; PRBC x3, GI consulted 10/10 PRBC x1 10/11 PRBC x1; new onset PNA on chest x ray 10/12: acute hypoxic respiratory failure overnight requiring BiPAP. Alcohol withdrawal requiring precedex gtt. Intubated for decreasing LOC and increasing 02 needs and worsening PNA on CXR.  10/13 bronchoscopy >> respiratory secretions from RLL, friable airways  - 10/13 Rt thoracentesis > 500 ml fluid: transudate  10/14 gastric residuals  - 10/14 Echo >> EF 55 to 60%, PAS 33 mmHg 10/16 acidosis ?from diprivan >> change to versed gtt  06/13/14:  Significant intermittent agitation.  On 45% fio2.hick tenacious ET tube secretions + .  on fent 150mcg and versed 6mg /h gtt. START LACTULOSE AND XIFAXAN 10/19 fio2 50 % and peep 12 on ARDS protocol 10/20 fio2 40% 8 peep. Slow progress 10/20 self extubation with urgent reintubation and fall off bed to floor 10/21 plan for trach tomorrow Thursday 10/22 trached (df) 10/23 agitation remains an issue.  Intake/Output Summary (Last 24 hours) at 06/20/14 0818 Last data filed at 06/20/14 0700  Gross per 24 hour  Intake 2495.03 ml  Output   6825 ml  Net -4329.97 ml    SUBJECTIVE/OVERNIGHT/INTERVAL HX Remains on precedex, diladued drip was started by Dr Marchelle Gearingamaswamy 10/23 Neg 5 liters  PHYSICAL EXAM:  Vital signs in last 24 hours: Temp:  [99.1 F (37.3 C)-101.8 F (38.8 C)] 99.5 F (37.5 C) (10/24 0800) Pulse Rate:  [70-88] 73 (10/24 0800) Resp:  [8-27] 8 (10/24  0800) BP: (101-127)/(40-65) 106/42 mmHg (10/24 0800) SpO2:  [89 %-94 %] 92 % (10/24 0800) FiO2 (%):  [40 %] 40 % (10/24 0700) Weight:  [102.5 kg (225 lb 15.5 oz)] 102.5 kg (225 lb 15.5 oz) (10/24 0400) Last BM Date: 06/19/14 VENT SETTINGS: Vent Mode:  [-] PRVC FiO2 (%):  [40 %] 40 % Set Rate:  [16 bmp] 16 bmp Vt Set:  [580 mL] 580 mL PEEP:  [5 cmH20] 5 cmH20 Plateau Pressure:  [11 cmH20-27 cmH20] 18 cmH20 I/O: Intake/Output     10/23 0701 - 10/24 0700 10/24 0701 - 10/25 0700   I.V. (mL/kg) 1320.6 (12.9)    NG/GT 1297.8    Total Intake(mL/kg) 2618.4 (25.5)    Urine (mL/kg/hr) 6275 (2.6)    Stool 1400 (0.6)    Total Output 7675     Net -5056.6          Stool Occurrence 1 x      PHYSICAL EXAMINATION:  General: no distress, rass - 1, not following commands Neuro: RASS -1 to -2 range HEENT: trach in place clean  No bleeding Cardiovascular: regular HSR RRR Lungs: CTA , reduced bases Abdomen: Soft, non-tender, + bowel sounds, TF's Musculoskeletal: no edema Skin: warm/dry  Lab Results: PULMONARY  Recent Labs Lab 06/14/14 1202 06/15/14 1106 06/17/14 1002 06/18/14 0528  PHART 7.420 7.376 7.366 7.349*  PCO2ART 32.3* 39.3 34.2* 33.8*  PO2ART 100.0 132.0* 72.3* 69.5*  HCO3 20.6 22.5 19.0* 17.8*  TCO2 19.4 21.2 18.2 17.1  O2SAT 98.1 99.3 93.8 90.6    CBC  Recent Labs Lab  06/18/14 0416 06/19/14 0430 06/20/14 0600  HGB 7.9* 7.6* 7.4*  HCT 25.9* 24.9* 24.3*  WBC 9.0 10.2 11.9*  PLT 187 165 157    COAGULATION  Recent Labs Lab 06/14/14 0453 06/18/14 0416  INR 1.40 1.52*    CARDIAC  No results found for this basename: TROPONINI,  in the last 168 hours No results found for this basename: PROBNP,  in the last 168 hours   CHEMISTRY  Recent Labs Lab 06/16/14 0530 06/17/14 0500 06/18/14 0416 06/18/14 1320 06/19/14 0430 06/20/14 0600  NA 152* 147 141 141 144 143  K 4.4 3.9 4.3 4.1 4.4 4.2  CL 119* 116* 112 112 114* 111  CO2 22 21 19  18* 19 23   GLUCOSE 132* 147* 133* 138* 148* 143*  BUN 19 21 23 22 20 20   CREATININE 1.06 1.00 1.06 0.97 0.95 0.88  CALCIUM 8.8 8.6 8.3* 8.4 8.3* 8.5  MG 2.0 1.8 2.2  --  1.9 1.9  PHOS 3.9 3.6 3.7  --  3.9 4.4   Estimated Creatinine Clearance: 139.2 ml/min (by C-G formula based on Cr of 0.88).   LIVER  Recent Labs Lab 06/14/14 0453 06/16/14 0530 06/18/14 0416  AST  --  88*  --   ALT  --  15  --   ALKPHOS  --  176*  --   BILITOT  --  2.4*  --   PROT  --  7.1  --   ALBUMIN  --  2.3*  --   INR 1.40  --  1.52*     INFECTIOUS  Recent Labs Lab 06/19/14 1200 06/20/14 0530 06/20/14 0600  LATICACIDVEN 1.6 1.2  --   PROCALCITON 0.23  --  0.22     ENDOCRINE CBG (last 3)   Recent Labs  06/19/14 2205 06/20/14 0037 06/20/14 0431  GLUCAP 154* 144* 157*     IMAGING x48h Dg Chest Port 1 View  06/20/2014   CLINICAL DATA:  Respiratory failure, hypertension, diabetes  EXAM: PORTABLE CHEST - 1 VIEW  COMPARISON:  06/19/2014  FINDINGS: Tracheostomy, left IJ central line, and feeding tube are stable in position. Low lung volumes persist with small pleural effusions and basilar atelectasis. Dense left lower lobe collapse/ consolidation persist obscuring the left hemidiaphragm. Heart remains enlarged. Central vascular congestion noted. No pneumothorax.  IMPRESSION: Cardiomegaly with vascular congestion and small pleural effusions, little interval change.  Bibasilar atelectasis and dense left lower lobe collapse/consolidation   Electronically Signed   By: Ruel Favorsrevor  Shick M.D.   On: 06/20/2014 08:12   Dg Chest Port 1 View  06/19/2014   CLINICAL DATA:  Respiratory failure, check tracheostomy position  EXAM: PORTABLE CHEST - 1 VIEW  COMPARISON:  06/18/2014  FINDINGS: Tracheostomy tube, feeding catheter and left jugular central venous line are again identified and unchanged. The cardiac shadow is again mildly prominent size. Central vascular congestion is again noted with increasing bibasilar  consolidation. Likely pleural effusions are present as well.  IMPRESSION: Persistent vascular congestion with increasing bibasilar consolidation.   Electronically Signed   By: Alcide CleverMark  Lukens M.D.   On: 06/19/2014 07:28   Dg Chest Port 1 View  06/18/2014   CLINICAL DATA:  Hypertension.  Diabetes.  Respiratory failure.  EXAM: PORTABLE CHEST - 1 VIEW  COMPARISON:  06/18/2014.  FINDINGS: Enlarged cardiac silhouette. Interval tracheostomy tube in satisfactory position. Feeding tube extending into the stomach. Left jugular catheter tip in the superior vena cava. Prominent interstitial markings and bilateral alveolar opacities with improvement. Decreased  bilateral pleural fluid.  IMPRESSION: Remain changes of congestive heart failure.   Electronically Signed   By: Gordan Payment M.D.   On: 06/18/2014 15:06    ASSESSMENT / PLAN:  PULMONARY  OETT 10/12 >>10/20 self extubation, re intubated 10/20>>10/22, Trach #6 per DF>>  A:   - Acute on chronic hypoxic respiratory failure 2nd to PNA, and b/l pleural effusions (transudate on Rt).   - Prolonged Mechanical Ventilation   P:  Will need to reduce or dc dilauded to wean Wean cpap 5 ps 10-12, shallow low TV reported, will assess PS 15 if needed Maintain neg balance, was neg over 5 liters last 24 hrs, remains with effusion layering left  CARDIOVASCULAR  A:  Hypotension 2nd to sedation meds.(resolved off pressors) Hypervolemia- remains  P:  Lasix remain  RENAL  Lab Results  Component Value Date   CREATININE 0.88 06/20/2014   CREATININE 0.95 06/19/2014   CREATININE 0.97 06/18/2014    Recent Labs Lab 06/18/14 1320 06/19/14 0430 06/20/14 0600  NA 141 144 143    A:  Stage III CKD. Non gap metabolic acidosis ?from diprivan. 06/12/14  Hypernatremia (resolving)   P:  Lasix maintain at current dose or reduction consideration Chem needed in am  Consider mag with such neg balance  GASTROINTESTINAL  A:  Severe protein calorie malnutrition.   ETOH with cirrhosis.  - Difficulty tolerating tube feeds. - persists 06/16/14  -  - constipation resolved 06/14/14 after lactulose.              -10/23 diarrhea .   P:  Tube feeds H2 blockade Continue xifaxan and follow clinically NO ROLE AMMONIA LEVELS  HEMATOLOGIC  A:  Anemia from chronic GI bleeding >> stable at present. P:  Limit phlebotomy Transfuse for hgb <7.0  May need some INR follow up  INFECTIOUS  A:  Right > left PNA. HCAP vs aspiration.  Flu negative. S/p cefepime 7d ending 06/14/14  P:  Pan culture  Sepsis biomarkers unimpressive thus far and no fevers overnight Monitor off abx further and follow BX ensure AU done  ENDOCRINE  A:  DMII with nephropathy. CBG (last 3)   Recent Labs  06/19/14 2205 06/20/14 0037 06/20/14 0431  GLUCAP 154* 144* 157*   P:  SSI  NEUROLOGIC  A:  Acute encephalopathy 2nd to delirium tremens, respiratory failure.Possible associted hepatic encephalopathy +  With high Ammonia.  - 06/13/14: Started Lactulose and Xifaxan 06/13/14 (off diprivan 10/16 due to acidosis)   -- . 06/19/14 - still agitated  P:  RASS goal -2 Will dc dilaudid while on precedex, safety? Continuet versed gtt 10/19, add ativan to dc this Continue  LACTULOSE and XIFAXAN since  06/13/14 due to possible heaptic encephalopathy , Continue precedex gtt but increase to max 2-2.4 if needed and tolerated Continue clonidine Continue seroquel Continue thiamine, folic acid Foot drop boots Add fentnayl as we dc dilauded  Concerned about dilauded Concerned about amount of benzo in liver dx  Family updated at bedside 10/24  Ccm time 30 min   Mcarthur Rossetti. Tyson Alias, MD, FACP Pgr: [phone] [facility]

## 2014-06-21 LAB — GLUCOSE, CAPILLARY
GLUCOSE-CAPILLARY: 179 mg/dL — AB (ref 70–99)
GLUCOSE-CAPILLARY: 171 mg/dL — AB (ref 70–99)
Glucose-Capillary: 159 mg/dL — ABNORMAL HIGH (ref 70–99)
Glucose-Capillary: 167 mg/dL — ABNORMAL HIGH (ref 70–99)
Glucose-Capillary: 201 mg/dL — ABNORMAL HIGH (ref 70–99)

## 2014-06-21 LAB — MAGNESIUM: Magnesium: 1.7 mg/dL (ref 1.5–2.5)

## 2014-06-21 LAB — BASIC METABOLIC PANEL
Anion gap: 12 (ref 5–15)
BUN: 21 mg/dL (ref 6–23)
CALCIUM: 8.7 mg/dL (ref 8.4–10.5)
CHLORIDE: 107 meq/L (ref 96–112)
CO2: 23 mEq/L (ref 19–32)
CREATININE: 0.87 mg/dL (ref 0.50–1.35)
Glucose, Bld: 187 mg/dL — ABNORMAL HIGH (ref 70–99)
Potassium: 4.2 mEq/L (ref 3.7–5.3)
Sodium: 142 mEq/L (ref 137–147)

## 2014-06-21 LAB — PROCALCITONIN: Procalcitonin: 0.28 ng/mL

## 2014-06-21 LAB — PHOSPHORUS: PHOSPHORUS: 3.2 mg/dL (ref 2.3–4.6)

## 2014-06-21 MED ORDER — MIDAZOLAM BOLUS VIA INFUSION
2.0000 mg | INTRAVENOUS | Status: DC | PRN
  Administered 2014-06-21: 2 mg via INTRAVENOUS
  Administered 2014-06-21: 4 mg via INTRAVENOUS
  Administered 2014-06-21: 2 mg via INTRAVENOUS
  Administered 2014-06-21: 4 mg via INTRAVENOUS
  Filled 2014-06-21: qty 6

## 2014-06-21 MED ORDER — HALOPERIDOL LACTATE 5 MG/ML IJ SOLN
5.0000 mg | Freq: Four times a day (QID) | INTRAMUSCULAR | Status: DC | PRN
  Administered 2014-06-21 – 2014-06-26 (×13): 5 mg via INTRAVENOUS
  Filled 2014-06-21 (×17): qty 1

## 2014-06-21 MED ORDER — LORAZEPAM 2 MG/ML PO CONC
4.0000 mg | Freq: Four times a day (QID) | ORAL | Status: DC
  Administered 2014-06-21 – 2014-06-22 (×5): 4 mg
  Filled 2014-06-21 (×5): qty 2

## 2014-06-21 MED ORDER — DEXMEDETOMIDINE HCL IN NACL 400 MCG/100ML IV SOLN
0.4000 ug/kg/h | INTRAVENOUS | Status: DC
  Administered 2014-06-21: 2.2 ug/kg/h via INTRAVENOUS
  Administered 2014-06-21: 2.1 ug/kg/h via INTRAVENOUS
  Administered 2014-06-21: 2 ug/kg/h via INTRAVENOUS
  Administered 2014-06-21: 2.1 ug/kg/h via INTRAVENOUS
  Administered 2014-06-21: 2 ug/kg/h via INTRAVENOUS
  Administered 2014-06-21: 1.9 ug/kg/h via INTRAVENOUS
  Administered 2014-06-22: 2.2 ug/kg/h via INTRAVENOUS
  Administered 2014-06-22: 2.6 ug/kg/h via INTRAVENOUS
  Administered 2014-06-22: 2.4 ug/kg/h via INTRAVENOUS
  Administered 2014-06-22: 2.6 ug/kg/h via INTRAVENOUS
  Administered 2014-06-22: 2.2 ug/kg/h via INTRAVENOUS
  Administered 2014-06-22: 2.3 ug/kg/h via INTRAVENOUS
  Administered 2014-06-22 (×4): 2.6 ug/kg/h via INTRAVENOUS
  Administered 2014-06-23 (×3): 3 ug/kg/h via INTRAVENOUS
  Administered 2014-06-23 (×2): 2 ug/kg/h via INTRAVENOUS
  Administered 2014-06-23: 2.7 ug/kg/h via INTRAVENOUS
  Administered 2014-06-23: 2 ug/kg/h via INTRAVENOUS
  Administered 2014-06-23: 3 ug/kg/h via INTRAVENOUS
  Administered 2014-06-23: 2.8 ug/kg/h via INTRAVENOUS
  Administered 2014-06-23 – 2014-06-26 (×41): 3 ug/kg/h via INTRAVENOUS
  Administered 2014-06-26 (×3): 2.5 ug/kg/h via INTRAVENOUS
  Administered 2014-06-26: 3 ug/kg/h via INTRAVENOUS
  Administered 2014-06-26 (×4): 2.5 ug/kg/h via INTRAVENOUS
  Administered 2014-06-26 (×2): 3 ug/kg/h via INTRAVENOUS
  Administered 2014-06-26: 2.5 ug/kg/h via INTRAVENOUS
  Administered 2014-06-26: 3 ug/kg/h via INTRAVENOUS
  Administered 2014-06-27 (×3): 2.5 ug/kg/h via INTRAVENOUS
  Administered 2014-06-27: 2 ug/kg/h via INTRAVENOUS
  Administered 2014-06-27: 1.5 ug/kg/h via INTRAVENOUS
  Administered 2014-06-27: 2.5 ug/kg/h via INTRAVENOUS
  Administered 2014-06-27 (×2): 1.5 ug/kg/h via INTRAVENOUS
  Administered 2014-06-27: 2 ug/kg/h via INTRAVENOUS
  Administered 2014-06-28 – 2014-06-29 (×33): 3 ug/kg/h via INTRAVENOUS
  Administered 2014-06-30 (×2): 2.5 ug/kg/h via INTRAVENOUS
  Administered 2014-06-30: 3.002 ug/kg/h via INTRAVENOUS
  Administered 2014-06-30 (×2): 3 ug/kg/h via INTRAVENOUS
  Administered 2014-06-30 (×2): 2.5 ug/kg/h via INTRAVENOUS
  Administered 2014-06-30: 3 ug/kg/h via INTRAVENOUS
  Administered 2014-06-30: 3.002 ug/kg/h via INTRAVENOUS
  Administered 2014-06-30: 3 ug/kg/h via INTRAVENOUS
  Administered 2014-06-30: 2.5 ug/kg/h via INTRAVENOUS
  Administered 2014-06-30: 3 ug/kg/h via INTRAVENOUS
  Administered 2014-06-30: 2.3 ug/kg/h via INTRAVENOUS
  Administered 2014-06-30: 2.5 ug/kg/h via INTRAVENOUS
  Administered 2014-06-30: 3 ug/kg/h via INTRAVENOUS
  Administered 2014-07-01: 1 ug/kg/h via INTRAVENOUS
  Administered 2014-07-01: 1.4 ug/kg/h via INTRAVENOUS
  Administered 2014-07-01: 1.2 ug/kg/h via INTRAVENOUS
  Administered 2014-07-01: 2 ug/kg/h via INTRAVENOUS
  Administered 2014-07-01: 1.2 ug/kg/h via INTRAVENOUS
  Administered 2014-07-01: 1.7 ug/kg/h via INTRAVENOUS
  Administered 2014-07-01: 1 ug/kg/h via INTRAVENOUS
  Administered 2014-07-02: 0.4 ug/kg/h via INTRAVENOUS
  Administered 2014-07-02: 0.8 ug/kg/h via INTRAVENOUS
  Filled 2014-06-21 (×2): qty 100
  Filled 2014-06-21: qty 200
  Filled 2014-06-21 (×6): qty 100
  Filled 2014-06-21: qty 200
  Filled 2014-06-21 (×8): qty 100
  Filled 2014-06-21: qty 200
  Filled 2014-06-21 (×21): qty 100
  Filled 2014-06-21: qty 200
  Filled 2014-06-21 (×4): qty 100
  Filled 2014-06-21 (×2): qty 200
  Filled 2014-06-21 (×7): qty 100
  Filled 2014-06-21: qty 200
  Filled 2014-06-21: qty 100
  Filled 2014-06-21: qty 400
  Filled 2014-06-21 (×3): qty 100
  Filled 2014-06-21: qty 200
  Filled 2014-06-21 (×3): qty 100
  Filled 2014-06-21: qty 200
  Filled 2014-06-21 (×5): qty 100
  Filled 2014-06-21: qty 200
  Filled 2014-06-21 (×2): qty 100
  Filled 2014-06-21: qty 200
  Filled 2014-06-21: qty 100
  Filled 2014-06-21: qty 200
  Filled 2014-06-21: qty 100
  Filled 2014-06-21: qty 200
  Filled 2014-06-21 (×5): qty 100
  Filled 2014-06-21: qty 200
  Filled 2014-06-21 (×2): qty 100
  Filled 2014-06-21: qty 200
  Filled 2014-06-21 (×6): qty 100
  Filled 2014-06-21: qty 200
  Filled 2014-06-21 (×16): qty 100
  Filled 2014-06-21: qty 200
  Filled 2014-06-21 (×4): qty 100
  Filled 2014-06-21 (×2): qty 200
  Filled 2014-06-21 (×5): qty 100
  Filled 2014-06-21: qty 200
  Filled 2014-06-21 (×6): qty 100

## 2014-06-21 MED ORDER — VANCOMYCIN HCL 10 G IV SOLR
1500.0000 mg | Freq: Three times a day (TID) | INTRAVENOUS | Status: DC
  Administered 2014-06-21 – 2014-06-23 (×5): 1500 mg via INTRAVENOUS
  Filled 2014-06-21 (×6): qty 1500

## 2014-06-21 MED ORDER — MAGNESIUM SULFATE 40 MG/ML IJ SOLN
2.0000 g | Freq: Once | INTRAMUSCULAR | Status: AC
  Administered 2014-06-21: 2 g via INTRAVENOUS
  Filled 2014-06-21: qty 50

## 2014-06-21 MED ORDER — RISPERIDONE 1 MG/ML PO SOLN
1.0000 mg | Freq: Two times a day (BID) | ORAL | Status: DC
  Administered 2014-06-21 – 2014-06-24 (×7): 1 mg via ORAL
  Filled 2014-06-21 (×8): qty 1

## 2014-06-21 MED ORDER — INSULIN GLARGINE 100 UNIT/ML ~~LOC~~ SOLN
5.0000 [IU] | Freq: Every day | SUBCUTANEOUS | Status: DC
Start: 2014-06-21 — End: 2014-06-24
  Administered 2014-06-21 – 2014-06-24 (×4): 5 [IU] via SUBCUTANEOUS
  Filled 2014-06-21 (×4): qty 0.05

## 2014-06-21 MED ORDER — FUROSEMIDE 10 MG/ML IJ SOLN
60.0000 mg | Freq: Two times a day (BID) | INTRAMUSCULAR | Status: DC
  Administered 2014-06-21 – 2014-06-26 (×10): 60 mg via INTRAVENOUS
  Filled 2014-06-21 (×10): qty 6

## 2014-06-21 MED ORDER — VANCOMYCIN HCL 10 G IV SOLR
2000.0000 mg | Freq: Once | INTRAVENOUS | Status: AC
  Administered 2014-06-21: 2000 mg via INTRAVENOUS
  Filled 2014-06-21: qty 2000

## 2014-06-21 MED ORDER — MAGNESIUM SULFATE 50 % IJ SOLN
2.0000 g | Freq: Once | INTRAMUSCULAR | Status: DC
  Filled 2014-06-21: qty 4

## 2014-06-21 NOTE — Progress Notes (Signed)
PULMONARY / CRITICAL CARE MEDICINE  Name: Jerome [name] MRN: [id number] DOB:  10/28/1975 ADMISSION DATE: 06/05/2014  CONSULTATION DATE: 06/08/2014  REFERRING MD : TRH  CHIEF COMPLAINT: Respiratory Failure     INITIAL PRESENTATION:  38 yo male with hx of ETOH presented with dyspnea/fatigue from profound anemia (Hb 4.5), and received multiple transfusions of PRBC.  Developed progressive respiratory failure with Rt > Lt pulmonary infiltrates, and PCCM assumed care in ICU.  Studies: 10/9 - There is hepatomegaly and splenomegaly consistent with parenchymal<BR>liver disease  SIGNIFICANT EVENTS:  10/09 admitted; PRBC x3, GI consulted 10/10 PRBC x1 10/11 PRBC x1; new onset PNA on chest x ray 10/12: acute hypoxic respiratory failure overnight requiring BiPAP. Alcohol withdrawal requiring precedex gtt. Intubated for decreasing LOC and increasing 02 needs and worsening PNA on CXR.  10/13 bronchoscopy >> respiratory secretions from RLL, friable airways  - 10/13 Rt thoracentesis > 500 ml fluid: transudate  10/14 gastric residuals  - 10/14 Echo >> EF 55 to 60%, PAS 33 mmHg 10/16 acidosis ?from diprivan >> change to versed gtt  06/13/14:  Significant intermittent agitation.  On 45% fio2.hick tenacious ET tube secretions + .  on fent 150mcg and versed 6mg /h gtt. START LACTULOSE AND XIFAXAN 10/19 fio2 50 % and peep 12 on ARDS protocol 10/20 fio2 40% 8 peep. Slow progress 10/20 self extubation with urgent reintubation and fall off bed to floor 10/21 plan for trach tomorrow Thursday 10/22 trached (df) 10/23 agitation remains an issue.  Intake/Output Summary (Last 24 hours) at 06/21/14 0804 Last data filed at 06/21/14 0700  Gross per 24 hour  Intake 1966.94 ml  Output   5355 ml  Net -3388.06 ml    SUBJECTIVE/OVERNIGHT/INTERVAL HX Fever, ABX started 10/24, precedex increased, versed drip restarted Neg 3.6 liters  PHYSICAL EXAM:  Vital signs in last 24 hours: Temp:  [99.9 F (37.7  C)-102.6 F (39.2 C)] 100.6 F (38.1 C) (10/25 0700) Pulse Rate:  [73-109] 82 (10/25 0700) Resp:  [16-32] 30 (10/25 0700) BP: (95-156)/(40-103) 120/66 mmHg (10/25 0700) SpO2:  [82 %-99 %] 93 % (10/25 0700) FiO2 (%):  [40 %-45 %] 40 % (10/25 0600) Weight:  [98.2 kg (216 lb 7.9 oz)] 98.2 kg (216 lb 7.9 oz) (10/25 0307) Last BM Date: 06/20/14 (Flexi-Seal) VENT SETTINGS: Vent Mode:  [-] PRVC FiO2 (%):  [40 %-45 %] 40 % Set Rate:  [16 bmp] 16 bmp Vt Set:  [580 mL] 580 mL PEEP:  [5 cmH20] 5 cmH20 Pressure Support:  [8 cmH20] 8 cmH20 Plateau Pressure:  [18 cmH20-21 cmH20] 18 cmH20 I/O: Intake/Output     10/24 0701 - 10/25 0700 10/25 0701 - 10/26 0700   I.V. (mL/kg) 1228.8 (12.5)    NG/GT 630    IV Piggyback 200    Total Intake(mL/kg) 2058.8 (21)    Urine (mL/kg/hr) 5455 (2.3)    Stool 300 (0.1)    Total Output 5755     Net -3696.2            PHYSICAL EXAMINATION:  General: no distress, rass 2, not following commands Neuro: RASS 2 HEENT: trach in place clean  No bleeding Cardiovascular: regular HSR RRR Lungs: slight coarse Abdomen: Soft, non-tender, + bowel sounds, TF's Musculoskeletal: no edema Skin: warm/dry  Lab Results: PULMONARY  Recent Labs Lab 06/14/14 1202 06/15/14 1106 06/17/14 1002 06/18/14 0528  PHART 7.420 7.376 7.366 7.349*  PCO2ART 32.3* 39.3 34.2* 33.8*  PO2ART 100.0 132.0* 72.3* 69.5*  HCO3 20.6 22.5 19.0* 17.8*  TCO2 19.4  21.2 18.2 17.1  O2SAT 98.1 99.3 93.8 90.6    CBC  Recent Labs Lab 06/18/14 0416 06/19/14 0430 06/20/14 0600  HGB 7.9* 7.6* 7.4*  HCT 25.9* 24.9* 24.3*  WBC 9.0 10.2 11.9*  PLT 187 165 157    COAGULATION  Recent Labs Lab 06/18/14 0416  INR 1.52*    CARDIAC  No results found for this basename: TROPONINI,  in the last 168 hours No results found for this basename: PROBNP,  in the last 168 hours   CHEMISTRY  Recent Labs Lab 06/17/14 0500 06/18/14 0416 06/18/14 1320 06/19/14 0430 06/20/14 0600  06/21/14 0405  NA 147 141 141 144 143 142  K 3.9 4.3 4.1 4.4 4.2 4.2  CL 116* 112 112 114* 111 107  CO2 21 19 18* 19 23 23   GLUCOSE 147* 133* 138* 148* 143* 187*  BUN 21 23 22 20 20 21   CREATININE 1.00 1.06 0.97 0.95 0.88 0.87  CALCIUM 8.6 8.3* 8.4 8.3* 8.5 8.7  MG 1.8 2.2  --  1.9 1.9 1.7  PHOS 3.6 3.7  --  3.9 4.4 3.2   Estimated Creatinine Clearance: 138 ml/min (by C-G formula based on Cr of 0.87).   LIVER  Recent Labs Lab 06/16/14 0530 06/18/14 0416  AST 88*  --   ALT 15  --   ALKPHOS 176*  --   BILITOT 2.4*  --   PROT 7.1  --   ALBUMIN 2.3*  --   INR  --  1.52*     INFECTIOUS  Recent Labs Lab 06/19/14 1200 06/20/14 0530 06/20/14 0600 06/21/14 0405  LATICACIDVEN 1.6 1.2  --   --   PROCALCITON 0.23  --  0.22 0.28     ENDOCRINE CBG (last 3)   Recent Labs  06/20/14 2051 06/20/14 2328 06/21/14 0444  GLUCAP 204* 216* 179*     IMAGING x48h Dg Chest Port 1 View  06/20/2014   CLINICAL DATA:  Respiratory failure, hypertension, diabetes  EXAM: PORTABLE CHEST - 1 VIEW  COMPARISON:  06/19/2014  FINDINGS: Tracheostomy, left IJ central line, and feeding tube are stable in position. Low lung volumes persist with small pleural effusions and basilar atelectasis. Dense left lower lobe collapse/ consolidation persist obscuring the left hemidiaphragm. Heart remains enlarged. Central vascular congestion noted. No pneumothorax.  IMPRESSION: Cardiomegaly with vascular congestion and small pleural effusions, little interval change.  Bibasilar atelectasis and dense left lower lobe collapse/consolidation   Electronically Signed   By: Ruel Favors M.D.   On: 06/20/2014 08:12    ASSESSMENT / PLAN:  PULMONARY  OETT 10/12 >>10/20 self extubation, re intubated 10/20>>10/22, Trach #6 (DF)>>  A:   - Acute on chronic hypoxic respiratory failure 2nd to PNA, and b/l pleural effusions (transudate on Rt).   - Prolonged Mechanical Ventilation   P:  Wean cpap 5 PS 12 as goal ,  goal 4 hrs Maintain neg balance further pcxr repeat with fevers required  CARDIOVASCULAR  A:  Hypotension 2nd to sedation meds.(resolved off pressors) Hypervolemia- remains  P:  Lasix remain, goal neg 2 liters Has tolerated neg balance incredibly well Tele ecg for qtc given neuro meds needed  RENAL  Lab Results  Component Value Date   CREATININE 0.87 06/21/2014   CREATININE 0.88 06/20/2014   CREATININE 0.95 06/19/2014    Recent Labs Lab 06/19/14 0430 06/20/14 0600 06/21/14 0405  NA 144 143 142    A:  Stage III CKD. Non gap metabolic acidosis ?from diprivan. 06/12/14  Hypernatremia (resolving) Mild hypomag   P:  Lasix maintain Chem needed in am  supp mag  GASTROINTESTINAL  A:  Severe protein calorie malnutrition.  ETOH with cirrhosis? (inate fxn on labs not reflective and US 10/9 with large liver not small shrunk)  P:  Tube feeds H2 blockade Continue xifaxan and follow clinically NO ROLE AMMONIA LEVELS Repeat lft  HEMATOLOGIC  A:  Anemia from chronic GI bleeding >> stable at present. P:  Cbc in am  Assess coags as it relates to liver fxn  INFECTIOUS  A:  Right > left PNA. HCAP vs aspiration.  Flu negative. S/p cefepime 7d ending 06/14/14 Fevers 10/24 (infection vs medication induced?)  10/23 urine>>> 10/23 blood>>> 10/23 cdiff>>>  P:  ceftaz 10/24>>> vanc 10/25>>>  Add vanc Follow BC Line dc'ed If fevers continued, may need to dc all Antidopamanergics  ENDOCRINE  A:  DMII with nephropathy. CBG (last 3)   Recent Labs  06/20/14 2051 06/20/14 2328 06/21/14 0444  GLUCAP 204* 216* 179*   P:  SSI Add low dose lantus  NEUROLOGIC  A:  Acute encephalopathy 2nd to delirium tremens, respiratory failure.Possible associted hepatic encephalopathy +  With high Ammonia.  P:  RASS goal 0 Continuet versed gtt 10/19, add ativan but versed restarted, increase ativan to dc drip again Continue  LACTULOSE and XIFAXAN since  06/13/14 due to  possible heaptic encephalopathy (unimpressed this is a major contributor), cosnider dc xifaxan in am  Continue precedex gtt but increase to max 2-2.4 Continue clonidine Does not respond well to Seroquel, change to Risperdal and increase to 2 tid if needed over next 48 hr, if fevers continued without source, dc Continue thiamine, folic acid Foot drop boots Fentanyl Add haldol after 12 lead ecg, if less 550  Innate liver fxn likley not as bad as cirrhosis , I would look to attempt depakote in next 24 hr if above not improved Family updated at bedside 10/24  Ccm time 30 min   Mcarthur Rossettianiel J. Tyson AliasFeinstein, MD, FACP Pgr: [phone] [facility]

## 2014-06-21 NOTE — Progress Notes (Signed)
ANTIBIOTIC CONSULT NOTE   Pharmacy Consult for ceftazidime, vancomycin Indication: fever in setting of recently treated HCAP  No Known Allergies  Patient Measurements: Height: 5\' 10"  (177.8 cm) Weight: 216 lb 7.9 oz (98.2 kg) IBW/kg (Calculated) : 73   Vital Signs: Temp: 100.6 F (38.1 C) (10/25 0700) Temp Source: Core (Comment) (10/25 0600) BP: 120/66 mmHg (10/25 0700) Pulse Rate: 82 (10/25 0700) Intake/Output from previous day: 10/24 0701 - 10/25 0700 In: 2058.8 [I.V.:1228.8; NG/GT:630; IV Piggyback:200] Out: 5755 [Urine:5455; Stool:300] Intake/Output from this shift:    Labs:  Recent Labs  06/19/14 0430 06/20/14 0600 06/21/14 0405  WBC 10.2 11.9*  --   HGB 7.6* 7.4*  --   PLT 165 157  --   CREATININE 0.95 0.88 0.87   Estimated Creatinine Clearance: 138 ml/min (by C-G formula based on Cr of 0.87).     Assessment: 38 y/o M with progressive respiratory failure in setting of EtOH withdrawal and hepatic encephalopathy, was treated for HCAP with empiric antibiotics - completed course on 10/18.  Febrile to 102.6 on 10/24 - empiric antibiotic therapy was resumed with ceftazidime 2 grams IV q8h.   Remains febrile today - orders received to resume vancomycin with pharmacy dosing assistance.  Note that during previous course of vancomycin this admission patient was requiring high dosages as outlined below.    Anti-infectives: 10/11 >> levofloxacin >> 10/12 10/12 >> vanc >> 10/16 10/12 >> cefepime >> 10/18 10/17 >> rifaximin for hepatic encephalopathy>> (10/27) 10/24 >> ceftazidime 10/25 >> vancomycin  Vancomycin levels and dosage changes from earlier in this admission: 10/13 1700 VT: <5 on 1g q8h, increased to 1250 mg q8h 10/15 1700 VT: 8.6 on 1250mg  q8h, increased to 1750mg  q8h  Microbiology data: 10/9 MRSA PCR: positive 10/12 influenza panel: neg 10/13 BAL cx: NGF 10/13 thoracentesis pleural cx: NGF 10/14 trach aspirate: oral flora 10/23 stool CDiff:  neg 10/23 urine: NGF 10/23 blood x 2: NG to date   Goal of Therapy:  Vancomycin trough 15-20 Appropriate antibiotic dosing for renal function; eradication of infection.   Plan:  1. Vancomycin 2 grams IV x 1 dose, then 1500 mg IV q8h. 2. Continue ceftazidime 2 grams IV q8h 3. Check vancomycin trough at steady-state 4. Follow serum creatinine, culture results, clinical course.    Elie Goodyandy Lashina Milles, PharmD, BCPS Pager: [phone] 06/21/2014  8:22 AM

## 2014-06-21 NOTE — Progress Notes (Signed)
62cc of 0.5mg /381ml Dilaudid wasted down the sink with Jeanella Antonrystal Mitchell, RN

## 2014-06-22 ENCOUNTER — Inpatient Hospital Stay (HOSPITAL_COMMUNITY): Payer: BC Managed Care – PPO

## 2014-06-22 LAB — BASIC METABOLIC PANEL
ANION GAP: 12 (ref 5–15)
BUN: 21 mg/dL (ref 6–23)
CALCIUM: 8 mg/dL — AB (ref 8.4–10.5)
CO2: 23 mEq/L (ref 19–32)
Chloride: 109 mEq/L (ref 96–112)
Creatinine, Ser: 0.78 mg/dL (ref 0.50–1.35)
GFR calc non Af Amer: >90 mL/min (ref >90)
Glucose, Bld: 183 mg/dL — ABNORMAL HIGH (ref 70–99)
Potassium: 3.9 mEq/L (ref 3.7–5.3)
Sodium: 144 mEq/L (ref 137–147)

## 2014-06-22 LAB — CBC WITH DIFFERENTIAL/PLATELET
BASOS ABS: 0.2 10*3/uL — AB (ref 0.0–0.1)
Basophils Relative: 1 % (ref 0–1)
EOS ABS: 0.8 10*3/uL — AB (ref 0.0–0.7)
Eosinophils Relative: 5 % (ref 0–5)
HCT: 23.2 % — ABNORMAL LOW (ref 39.0–52.0)
Hemoglobin: 7.4 g/dL — ABNORMAL LOW (ref 13.0–17.0)
LYMPHS ABS: 1.2 10*3/uL (ref 0.7–4.0)
LYMPHS PCT: 8 % — AB (ref 12–46)
MCH: 25.8 pg — AB (ref 26.0–34.0)
MCHC: 31.9 g/dL (ref 30.0–36.0)
MCV: 80.8 fL (ref 78.0–100.0)
MONO ABS: 1.1 10*3/uL — AB (ref 0.1–1.0)
Monocytes Relative: 7 % (ref 3–12)
NEUTROS ABS: 12.1 10*3/uL — AB (ref 1.7–7.7)
Neutrophils Relative %: 79 % — ABNORMAL HIGH (ref 43–77)
Platelets: 147 10*3/uL — ABNORMAL LOW (ref 150–400)
RBC: 2.87 MIL/uL — ABNORMAL LOW (ref 4.22–5.81)
RDW: 22.8 % — AB (ref 11.5–15.5)
WBC: 15.4 10*3/uL — ABNORMAL HIGH (ref 4.0–10.5)

## 2014-06-22 LAB — HEPATIC FUNCTION PANEL
ALBUMIN: 2.1 g/dL — AB (ref 3.5–5.2)
ALK PHOS: 193 U/L — AB (ref 39–117)
ALT: 26 U/L (ref 0–53)
AST: 85 U/L — ABNORMAL HIGH (ref 0–37)
BILIRUBIN DIRECT: 1 mg/dL — AB (ref 0.0–0.3)
Indirect Bilirubin: 0.7 mg/dL (ref 0.3–0.9)
Total Bilirubin: 1.7 mg/dL — ABNORMAL HIGH (ref 0.3–1.2)
Total Protein: 7.1 g/dL (ref 6.0–8.3)

## 2014-06-22 LAB — GLUCOSE, CAPILLARY
GLUCOSE-CAPILLARY: 183 mg/dL — AB (ref 70–99)
Glucose-Capillary: 173 mg/dL — ABNORMAL HIGH (ref 70–99)
Glucose-Capillary: 163 mg/dL — ABNORMAL HIGH (ref 70–99)
Glucose-Capillary: 177 mg/dL — ABNORMAL HIGH (ref 70–99)
Glucose-Capillary: 163 mg/dL — ABNORMAL HIGH (ref 70–99)
Glucose-Capillary: 177 mg/dL — ABNORMAL HIGH (ref 70–99)

## 2014-06-22 MED ORDER — LORAZEPAM 2 MG/ML IJ SOLN
INTRAMUSCULAR | Status: AC
  Filled 2014-06-22: qty 1

## 2014-06-22 MED ORDER — LORAZEPAM 2 MG/ML IJ SOLN
0.5000 mg | INTRAMUSCULAR | Status: DC | PRN
  Administered 2014-06-22: 1 mg via INTRAVENOUS
  Filled 2014-06-22: qty 1

## 2014-06-22 MED ORDER — VALPROIC ACID 250 MG/5ML PO SYRP
250.0000 mg | ORAL_SOLUTION | Freq: Two times a day (BID) | ORAL | Status: DC
  Administered 2014-06-22 – 2014-07-04 (×25): 250 mg via ORAL
  Filled 2014-06-22 (×26): qty 5

## 2014-06-22 MED ORDER — LORAZEPAM 2 MG/ML PO CONC
1.0000 mg | Freq: Four times a day (QID) | ORAL | Status: DC
  Administered 2014-06-22 – 2014-06-24 (×8): 1 mg
  Filled 2014-06-22 (×8): qty 1

## 2014-06-22 NOTE — Progress Notes (Signed)
PULMONARY / CRITICAL CARE MEDICINE  Name: Jerome [name] MRN: [id number] DOB:  10/28/1975 ADMISSION DATE: 06/05/2014  CONSULTATION DATE: 06/08/2014  REFERRING MD : TRH  CHIEF COMPLAINT: Respiratory Failure     INITIAL PRESENTATION:  38 yo male with hx of ETOH presented with dyspnea/fatigue from profound anemia (Hb 4.5), and received multiple transfusions of PRBC.  Developed progressive respiratory failure with Rt > Lt pulmonary infiltrates, and PCCM assumed care in ICU.  Studies: 10/9 - There is hepatomegaly and splenomegaly consistent with parenchymal<BR>liver disease  SIGNIFICANT EVENTS:  10/09 admitted; PRBC x3, GI consulted 10/10 PRBC x1 10/11 PRBC x1; new onset PNA on chest x ray 10/12: acute hypoxic respiratory failure overnight requiring BiPAP. Alcohol withdrawal requiring precedex gtt. Intubated for decreasing LOC and increasing 02 needs and worsening PNA on CXR.  10/13 bronchoscopy >> respiratory secretions from RLL, friable airways  - 10/13 Rt thoracentesis > 500 ml fluid: transudate  10/14 gastric residuals  - 10/14 Echo >> EF 55 to 60%, PAS 33 mmHg 10/16 acidosis ?from diprivan >> change to versed gtt  06/13/14:  Significant intermitteNavalLee And Bae Gi Med226 7(40Research scientistCNavalUs Air Force HospiCNavalAdventist Healthcare White Oa([phone])[phone] [phone]. ClaiNavalBaptist Surgery And Endoscopy Centers LLC Dba Baptist Health Surgery CentNavalMackinaw Su614(86ReseaPlNavalTidelands Waccamaw [phone] Millennium [phone]t City [phone](82Research sciNavalGadsden S517-6(81ReNavalRehab Cente609 59ReZanNavNavalBeckley V(239)Research scientisMouNavalBaylor Surgicare At Plano Parkway LLC Dba Baylor Scott ANavalSagecNavalSelect Specialty Ho3217Research scientist (SNavalMedical Cent251-89ResearHeNavalWaco Gastroenterology [phone](7ResearcKennettNavalKaiser F([phone] Ambulatory Su([phone] scientist (phyHNavalProvidence Saint Josep718-Research scientist (physical scAliCaptain James A. Lovell FederalTennova Healthcare - Cleveland295Drema HalonrRiver RegionalBaylor Scott & White Medical Center At Gra<MEASUREMENT mqLmaDYJPaFwNquIs$ /h gtt. START LACTULOSE AND XIFAXAN 10/19 fio2 50 % and peep 12 on ARDS protocol 10/20 fio2 40% 8 peep. Slow progress 10/20 self extubation with urgent reintubation and fall off bed to floor 10/22 trached (df) 10/23 agitation remains an issue. 10/25 fever, abx started 10/26 sedated or agitated  Intake/Output Summary (Last 24 hours) at 06/22/14 0947 Last data filed at 06/22/14 0900  Gross per 24 hour  Intake 5482.28 ml  Output   4473 ml  Net 1009.28 ml    SUBJECTIVE/OVERNIGHT/INTERVAL HX Remains sedated or agitated   PHYSICAL EXAM:  Vital signs in last 24 hours: Temp:  [99.1 F (37.3 C)-100.2 F (37.9 C)] 99.1 F (37.3 C)  (10/26 0900) Pulse Rate:  [37-113] 69 (10/26 0900) Resp:  [0-35] 24 (10/26 0900) BP: (120-147)/(41-109) 134/51 mmHg (10/26 0935) SpO2:  [88 %-96 %] 91 % (10/26 0900) FiO2 (%):  [40 %] 40 % (10/26 0935) Last BM Date: 06/21/14 VENT SETTINGS: Vent Mode:  [-] PSV FiO2 (%):  [40 %] 40 % Set Rate:  [16 bmp] 16 bmp Vt Set:  [580 mL] 580 mL PEEP:  [5 cmH20] 5 cmH20 Pressure Support:  [10 cmH20-20 cmH20] 10 cmH20 Plateau Pressure:  [16 cmH20-22 cmH20] 22 cmH20 I/O: Intake/Output     10/25 0701 - 10/26 0700 10/26 0701 - 10/27 0700   I.V. (mL/kg) 1694.7 (17.3) 113.8 (1.2)   Other 30    NG/GT 2200 100   IV Piggyback 1600 50   Total Intake(mL/kg) 5524.7 (56.3) 263.8 (2.7)   Urine (mL/kg/hr) 4578 (1.9) 1300 (4.8)   Stool 20 (0)    Total Output 4598 1300   Net +926.7 -1036.2          PHYSICAL EXAMINATION:  General: no distress, rass 2, not following commands Neuro: RASS 2, sedated HEENT: trach in place clean  No bleeding Cardiovascular: regular HSR RRR Lungs: slight coarse, decreased in bases Abdomen: Soft, non-tender, + bowel sounds, TF's Musculoskeletal: no edema Skin: warm/dry  Lab Results: PULMONARY  Recent Labs Lab 06/15/14 1106 06/17/14 1002 06/18/14 0528  PHART 7.376 7.366 7.349*  PCO2ART 39.3 34.2* 33.8*  PO2ART 132.0* 72.3* 69.5*  HCO3 22.5 19.0* 17.8*  TCO2 21.2 18.2 17.1  O2SAT 99.3 93.8 90.6    CBC  Recent Labs Lab 06/19/14 0430 06/20/14 0600  06/22/14 0345  HGB 7.6* 7.4* 7.4*  HCT 24.9* 24.3* 23.2*  WBC 10.2 11.9* 15.4*  PLT 165 157 147*    COAGULATION  Recent Labs Lab 06/18/14 0416  INR 1.52*    CARDIAC  No results found for this basename: TROPONINI,  in the last 168 hours No results found for this basename: PROBNP,  in the last 168 hours   CHEMISTRY  Recent Labs Lab 06/17/14 0500 06/18/14 0416 06/18/14 1320 06/19/14 0430 06/20/14 0600 06/21/14 0405 06/22/14 0345  NA 147 141 141 144 143 142 144  K 3.9 4.3 4.1 4.4 4.2 4.2 3.9   CL 116* 112 112 114* 111 107 109  CO2 21 19 18* 19 23 23 23   GLUCOSE 147* 133* 138* 148* 143* 187* 183*  BUN 21 23 22 20 20 21 21   CREATININE 1.00 1.06 0.97 0.95 0.88 0.87 0.78  CALCIUM 8.6 8.3* 8.4 8.3* 8.5 8.7 8.0*  MG 1.8 2.2  --  1.9 1.9 1.7  --   PHOS 3.6 3.7  --  3.9 4.4 3.2  --    Estimated Creatinine Clearance: 150 ml/min (by C-G formula based on Cr of 0.78).   LIVER  Recent Labs Lab 06/16/14 0530 06/18/14 0416 06/22/14 0345  AST 88*  --  85*  ALT 15  --  26  ALKPHOS 176*  --  193*  BILITOT 2.4*  --  1.7*  PROT 7.1  --  7.1  ALBUMIN 2.3*  --  2.1*  INR  --  1.52*  --      INFECTIOUS  Recent Labs Lab 06/19/14 1200 06/20/14 0530 06/20/14 0600 06/21/14 0405  LATICACIDVEN 1.6 1.2  --   --   PROCALCITON 0.23  --  0.22 0.28     ENDOCRINE CBG (last 3)   Recent Labs  06/22/14 0130 06/22/14 0547 06/22/14 0802  GLUCAP 173* 163* 177*     IMAGING x48h Dg Chest Port 1 View  06/22/2014   CLINICAL DATA:  38 year old with fever and profound anemia. Patient has a tracheostomy tube.  EXAM: PORTABLE CHEST - 1 VIEW  COMPARISON:  06/20/2014  FINDINGS: There is a tracheostomy tube and feeding tube. The feeding tube tip is beyond the image. Perihilar densities may represent edema. There is improved aeration in the left lung. Persistent densities at left lung base suggest pleural fluid and volume loss/consolidation. Heart size is grossly stable.  IMPRESSION: Evidence for mild pulmonary edema with slightly improved aeration in the left lung.  Persistent densities at the left lung base as described.   Electronically Signed   By: Richarda Overlie M.D.   On: 06/22/2014 07:45    ASSESSMENT / PLAN:  PULMONARY  OETT 10/12 >>10/20 self extubation, re intubated 10/20>>10/22, Trach #6 (DF)>>  A:   - Acute on chronic hypoxic respiratory failure 2nd to PNA, and b/l pleural effusions (transudate on Rt).   - Prolonged Mechanical Ventilation due to the above + agitation /  encephalopathy P:  Wean per protocol > attempt to go to ATC on 10/27 Maintain neg balance as tolerated   CARDIOVASCULAR  A:  Hypotension , resolved Hypervolemia P:  - continue to push diuresis - follow QT-c, ECG  RENAL  Lab Results  Component Value Date   CREATININE 0.78 06/22/2014   CREATININE 0.87 06/21/2014   CREATININE 0.88 06/20/2014    Recent Labs Lab 06/20/14 0600 06/21/14 0405 06/22/14 0345  NA 143 142 144    A:  Stage III CKD. Non gap metabolic  acidosis ?from diprivan. 06/12/14  Hypernatremia (resolved) Mild hypomag(resolved)   P:  Diuresis Chem daily  GASTROINTESTINAL  A:  Severe protein calorie malnutrition.  ETOH with cirrhosis? (inate fxn on labs not reflective and US 10/9 with large liver not small shrunk)  P:  Tube feeds H2 blockade Continue xifaxan and follow clinically(consider dc soon) Follow LFT  HEMATOLOGIC  A:  Anemia from chronic GI bleeding >> stable at present. P:  Cbc  Assess coags as it relates to liver fxn  INFECTIOUS  A:  Right > left PNA. HCAP vs aspiration.  Flu negative. S/p cefepime 7d ending 06/14/14 Fevers 10/24 (infection vs medication induced?)  10/23 urine>>>neg 10/23 blood>>> 10/23 cdiff>>>neg  P:  ceftaz 10/24>>> vanc 10/25>>>   ENDOCRINE  A:  DMII with nephropathy. CBG (last 3)   Recent Labs  06/22/14 0130 06/22/14 0547 06/22/14 0802  GLUCAP 173* 163* 177*   P:  SSI Add low dose lantus  NEUROLOGIC  A:  Acute encephalopathy 2nd to delirium tremens, respiratory failure. Possible associted hepatic encephalopathy +  With high Ammonia.  P:  - RASS goal 0 - D/c LACTULOSE and XIFAXAN on 10/26 as hepatic encephalopathy unlikely - Continue precedex gtt and increase to max to 3.0 - D/c versed gtt - Decrease scheduled ativan; once appropriate ativan dose determined (as precedex decreases as well) will likely convert to clonazepam - Continue clonidine - Risperdal 2mg  BID; if fevers continued  without source, then stop - Fentanyl patch in place, consider decrease next few days - Haldol prn - 10/26 add Depakote  - D/c thiamine, folic acid 10/26 (has received several days) - Foot drop boots   Family, parents,updated at bedside 10/26  [facility] Southteve Minor ACNP Adolph PollackLe Bauer PCCM Pager [phone] till 3 pm If no answer page [phone] 06/22/2014, 9:55 AM   Attending Note:  Independent CC time 35 minutes. I have examined pt, reviewed data, agree with the notes as amended above. L:argest barrier to progression at this point is agitated delirium. Some component of this is likely withdrawal. Multiple meds also playing a role. I have adjusted on 10/26 > try to use primarily precedex with maintenance ativan. Add depakote to risperidol on 10/26.  Follow MS off lactulose. Will try to go to ATC on 10/27  Levy Pupaobert Devora Tortorella, MD, PhD 06/22/2014, 12:16 PM [facility] [phone] or if no answer [phone]

## 2014-06-22 NOTE — Progress Notes (Signed)
eLink Physician-Brief Progress Note Patient Name: Jerome BodyWilliam [name] DOB: 05/12/1976 MRN: [id number]   Date of Service  06/22/2014  HPI/Events of Note  Severe agitation  eICU Interventions  Meds reviewed PRN IV lorazepam ordered     Intervention Category Major Interventions: Change in mental status - evaluation and management  Jerome FischerDavid Simonds 06/22/2014, 10:30 PM

## 2014-06-22 NOTE — Plan of Care (Signed)
Problem: ICU Phase Progression Outcomes Goal: O2 sats trending toward baseline Outcome: Progressing Pt tolerating CPAP/PSV  15/5 for nearly 5 hrs so far today.  02 sats have been stable.  Pt intermittently tachypneic due to restlessness/agitation but with time and verbal encouragement resp. rate decreases and pt able to reamin on PSV. Goal: Pain controlled with appropriate interventions Outcome: Progressing Fentanyl patch TD remains intact, Fentanyl IV given prn.

## 2014-06-22 NOTE — Progress Notes (Signed)
ANTIBIOTIC CONSULT NOTE - FOLLOW UP  Pharmacy Consult for ceftazidime, vancomycin Indication: fever in setting of recently treated HCAP  No Known Allergies  Patient Measurements: Height: 5\' 10"  (177.8 cm) Weight: 216 lb 7.9 oz (98.2 kg) IBW/kg (Calculated) : 73  Vital Signs: Temp: 99.1 F (37.3 C) (10/26 0900) BP: 120/56 mmHg (10/26 0900) Pulse Rate: 69 (10/26 0900) Intake/Output from previous day: 10/25 0701 - 10/26 0700 In: 5524.7 [I.V.:1694.7; NG/GT:2200; IV Piggyback:1600] Out: 4598 [Urine:4578; Stool:20]  Labs:  Recent Labs  06/20/14 0600 06/21/14 0405 06/22/14 0345  WBC 11.9*  --  15.4*  HGB 7.4*  --  7.4*  PLT 157  --  147*  CREATININE 0.88 0.87 0.78   Estimated Creatinine Clearance: 150 ml/min (by C-G formula based on Cr of 0.78). No results found for this basename: Rolm GalaVANCOTROUGH, VANCOPEAK, VANCORANDOM, GENTTROUGH, GENTPEAK, GENTRANDOM, TOBRATROUGH, TOBRAPEAK, TOBRARND, AMIKACINPEAK, AMIKACINTROU, AMIKACIN,  in the last 72 hours     Assessment: 38 yo M admitted 10/9 with progressive respiratory failure in setting of EtOH withdrawal and hepatic encephalopathy.  He was treated for HCAP with empiric antibiotics - completed course on 10/18.  Febrile to 102.6 on 10/24 - empiric antibiotic therapy was resumed with ceftazidime 2 grams IV q8h, but remained febrile on 10/25 and pharmacy was consulted to resume vancomycin dosing.    Anti-infectives: 10/11 >> levofloxacin >> 10/12 10/12 >> vanc >> 10/16 10/12 >> cefepime >> 10/18 10/17 >> rifaximin for hepatic encephalopathy>> (10/27) 10/24 >> ceftazidime >> 10/25 >> vancomycin >>  Microbiology data: 10/9 MRSA PCR: positive 10/12 influenza panel: neg 10/13 BAL cx: NGF 10/13 thoracentesis pleural cx: NGF 10/14 trach aspirate: oral flora 10/23 CDiff: neg 10/23 urine: NGF 10/23 blood x 2: ngtd  Vancomycin levels and dosage changes: 10/13 1700 VT: <5 on 1g q8h, increased to 1250 mg q8h 10/15 1700 VT: 8.6 on  1250mg  q8h, increased to 1750mg  q8h 10/26 1700 VT: pending   Goal of Therapy:  Vancomycin trough level 15-20 mcg/ml Appropriate abx dosing, eradication of infection.   Plan:   Continue ceftazidime 2 grams IV q8h  Continue Vancomycin 1500 mg IV q8h.  Measure Vanc trough at steady state.  Follow up renal fxn and culture results.   Lynann Beaverhristine Roderica Cathell PharmD, BCPS Pager [phone] 06/22/2014 9:33 AM

## 2014-06-23 ENCOUNTER — Inpatient Hospital Stay (HOSPITAL_COMMUNITY): Payer: BC Managed Care – PPO

## 2014-06-23 DIAGNOSIS — J9620 Acute and chronic respiratory failure, unspecified whether with hypoxia or hypercapnia: Secondary | ICD-10-CM

## 2014-06-23 LAB — BASIC METABOLIC PANEL
Anion gap: 12 (ref 5–15)
BUN: 21 mg/dL (ref 6–23)
CO2: 24 mEq/L (ref 19–32)
CREATININE: 0.8 mg/dL (ref 0.50–1.35)
Calcium: 8 mg/dL — ABNORMAL LOW (ref 8.4–10.5)
Chloride: 111 mEq/L (ref 96–112)
GFR calc Af Amer: >90 mL/min (ref >90)
GLUCOSE: 170 mg/dL — AB (ref 70–99)
POTASSIUM: 3.9 meq/L (ref 3.7–5.3)
Sodium: 147 mEq/L (ref 137–147)

## 2014-06-23 LAB — GLUCOSE, CAPILLARY
GLUCOSE-CAPILLARY: 153 mg/dL — AB (ref 70–99)
GLUCOSE-CAPILLARY: 176 mg/dL — AB (ref 70–99)
Glucose-Capillary: 168 mg/dL — ABNORMAL HIGH (ref 70–99)
Glucose-Capillary: 196 mg/dL — ABNORMAL HIGH (ref 70–99)
Glucose-Capillary: 212 mg/dL — ABNORMAL HIGH (ref 70–99)
Glucose-Capillary: 191 mg/dL — ABNORMAL HIGH (ref 70–99)

## 2014-06-23 LAB — HEPATIC FUNCTION PANEL
ALBUMIN: 2.1 g/dL — AB (ref 3.5–5.2)
ALK PHOS: 214 U/L — AB (ref 39–117)
ALT: 30 U/L (ref 0–53)
AST: 91 U/L — AB (ref 0–37)
BILIRUBIN DIRECT: 0.9 mg/dL — AB (ref 0.0–0.3)
Indirect Bilirubin: 0.7 mg/dL (ref 0.3–0.9)
Total Bilirubin: 1.6 mg/dL — ABNORMAL HIGH (ref 0.3–1.2)
Total Protein: 7 g/dL (ref 6.0–8.3)

## 2014-06-23 LAB — CBC
HEMATOCRIT: 23.3 % — AB (ref 39.0–52.0)
HEMOGLOBIN: 7.3 g/dL — AB (ref 13.0–17.0)
MCH: 25.7 pg — ABNORMAL LOW (ref 26.0–34.0)
MCHC: 31.3 g/dL (ref 30.0–36.0)
MCV: 82 fL (ref 78.0–100.0)
Platelets: 155 10*3/uL (ref 150–400)
RBC: 2.84 MIL/uL — ABNORMAL LOW (ref 4.22–5.81)
RDW: 23.1 % — ABNORMAL HIGH (ref 11.5–15.5)
WBC: 16.4 10*3/uL — ABNORMAL HIGH (ref 4.0–10.5)

## 2014-06-23 LAB — PROTIME-INR
INR: 1.51 — ABNORMAL HIGH (ref 0.00–1.49)
Prothrombin Time: 18.4 seconds — ABNORMAL HIGH (ref 11.6–15.2)

## 2014-06-23 LAB — VANCOMYCIN, TROUGH: Vancomycin Tr: 12.6 ug/mL (ref 10.0–20.0)

## 2014-06-23 MED ORDER — MIDAZOLAM HCL 2 MG/2ML IJ SOLN
INTRAMUSCULAR | Status: AC
  Filled 2014-06-23: qty 2

## 2014-06-23 MED ORDER — MIDAZOLAM HCL 2 MG/2ML IJ SOLN
2.0000 mg | INTRAMUSCULAR | Status: DC | PRN
  Administered 2014-06-23 – 2014-07-01 (×42): 2 mg via INTRAVENOUS
  Filled 2014-06-23 (×42): qty 2

## 2014-06-23 MED ORDER — MIDAZOLAM HCL 2 MG/2ML IJ SOLN: 2.0000 mg | Freq: Once | INTRAMUSCULAR | Status: DC

## 2014-06-23 MED ORDER — VANCOMYCIN HCL 10 G IV SOLR
1750.0000 mg | Freq: Three times a day (TID) | INTRAVENOUS | Status: DC
  Administered 2014-06-23: 1750 mg via INTRAVENOUS
  Filled 2014-06-23 (×2): qty 1750

## 2014-06-23 MED ORDER — FENTANYL 75 MCG/HR TD PT72
75.0000 ug | MEDICATED_PATCH | TRANSDERMAL | Status: DC
  Administered 2014-06-23: 75 ug via TRANSDERMAL
  Filled 2014-06-23: qty 1

## 2014-06-23 MED ORDER — MIDAZOLAM HCL 2 MG/2ML IJ SOLN
2.0000 mg | INTRAMUSCULAR | Status: AC | PRN
  Administered 2014-06-23 (×3): 2 mg via INTRAVENOUS
  Filled 2014-06-23 (×3): qty 2

## 2014-06-23 NOTE — Progress Notes (Addendum)
ANTIBIOTIC CONSULT NOTE - FOLLOW UP  Pharmacy Consult for ceftazidime, vancomycin Indication: fever in setting of recently treated HCAP  No Known Allergies  Patient Measurements: Height: 5\' 10"  (177.8 cm) Weight: 212 lb 11.9 oz (96.5 kg) IBW/kg (Calculated) : 73  Vital Signs: Temp: 99.3 F (37.4 C) (10/27 0500) Temp Source: Core (Comment) (10/27 0400) BP: 147/76 mmHg (10/27 0500) Pulse Rate: 68 (10/27 0500) Intake/Output from previous day: 10/26 0701 - 10/27 0700 In: 2779 [I.V.:1399; NG/GT:830; IV Piggyback:550] Out: 4671 [Urine:4670; Stool:1]  Labs:  Recent Labs  06/21/14 0405 06/22/14 0345 06/23/14 0130  WBC  --  15.4* 16.4*  HGB  --  7.4* 7.3*  PLT  --  147* 155  CREATININE 0.87 0.78 0.80   Estimated Creatinine Clearance: 148.8 ml/min (by C-G formula based on Cr of 0.8).  Recent Labs  06/23/14 0130  VANCOTROUGH 12.6       Assessment: 38 yo M admitted 10/9 with progressive respiratory failure in setting of EtOH withdrawal and hepatic encephalopathy.  He was treated for HCAP with empiric antibiotics - completed course on 10/18.  Febrile to 102.6 on 10/24 - empiric antibiotic therapy was resumed with ceftazidime 2 grams IV q8h, but remained febrile on 10/25 and pharmacy was consulted to resume vancomycin dosing.    Anti-infectives: 10/11 >> levofloxacin >> 10/12 10/12 >> vanc >> 10/16 10/12 >> cefepime >> 10/18 10/17 >> rifaximin (HE) >> 10/26 10/24 >> ceftazidime >> 10/25 >> vancomycin >>  Microbiology data: 10/9 MRSA PCR: positive 10/12 influenza panel: neg 10/13 BAL cx: NGF 10/13 thoracentesis pleural cx: NGF 10/14 trach aspirate: oral flora 10/23 CDiff: neg 10/23 urine: NGF 10/23 blood x 2: ngtd  Vancomycin levels and dosage changes: 10/13 1700 VT: <5 on 1g q8h, increased to 1250 mg q8h 10/15 1700 VT: 8.6 on 1250mg  q8h, increased to 1750mg  q8h 10/26 0130 VT: 12.6 on 1500mg  q8h, increased to 1750mg  q8h  Today, 10/27: Day #4 Vanc and  ceftazidime   Tmax: 99.9  WBC: increased to 16.4   Renal: SCr 0.8, CrCl > 100 ml/min    Goal of Therapy:  Vancomycin trough level 15-20 mcg/ml Appropriate abx dosing, eradication of infection.   Plan:   Increase to Vancomycin 1750 mg IV q8h.  Recheck Vanc trough at new steady state.  Continue ceftazidime 2 grams IV q8h  Follow up renal fxn and culture results.   Lynann Beaverhristine Deem Marmol PharmD, BCPS Pager [phone] 06/23/2014 7:10 AM

## 2014-06-23 NOTE — Progress Notes (Signed)
PULMONARY / CRITICAL CARE MEDICINE  Name: Jerome BodyWilliam [name] MRN: [id number] DOB:  10/28/1975 ADMISSION DATE: 06/05/2014  CONSULTATION DATE: 06/08/2014  REFERRING MD : TRH  CHIEF COMPLAINT: Respiratory Failure     INITIAL PRESENTATION:  38 yo male with hx of ETOH presented with dyspnea/fatigue from profound anemia (Hb 4.5), and received multiple transfusions of PRBC.  Developed progressive respiratory failure with Rt > Lt pulmonary infiltrates, and PCCM assumed care in ICU.  Studies: 10/9 - There is hepatomegaly and splenomegaly consistent with parenchymal<BR>liver disease  SIGNIFICANT EVENTS:  10/09 admitted; PRBC x3, GI consulted 10/10 PRBC x1 10/11 PRBC x1; new onset PNA on chest x ray 10/12: acute hypoxic respiratory failure overnight requiring BiPAP. Alcohol withdrawal requiring precedex gtt. Intubated for decreasing LOC and increasing 02 needs and worsening PNA on CXR.  10/13 bronchoscopy >> respiratory secretions from RLL, friable airways  - 10/13 Rt thoracentesis > 500 ml fluid: transudate  10/14 gastric residuals  - 10/14 Echo >> EF 55 to 60%, PAS 33 mmHg 10/16 acidosis ?from diprivan >> change to versed gtt  06/13/14:  Significant intermittent agitation.  On 45% fio2.hick tenacious ET tube secretions + .  on fent 150mcg and versed 6mg /h gtt. START LACTULOSE AND XIFAXAN 10/19 fio2 50 % and peep 12 on ARDS protocol 10/20 fio2 40% 8 peep. Slow progress 10/20 self extubation with urgent reintubation and fall off bed to floor 10/22 trached (df) 10/23 agitation remains an issue. 10/25 fever, abx started 10/26 sedated or agitated 10/27 agitation remains an issue. Will continue to wean and may make t collar today.  Intake/Output Summary (Last 24 hours) at 06/23/14 0843 Last data filed at 06/23/14 0716  Gross per 24 hour  Intake 2700.11 ml  Output   3971 ml  Net -1270.89 ml    SUBJECTIVE/OVERNIGHT/INTERVAL HX Still with agitation, required some extrat benzo's o/n but  overall improved MS and ability to interact compared with 10/26   PHYSICAL EXAM:  Vital signs in last 24 hours: Temp:  [99 F (37.2 C)-99.9 F (37.7 C)] 99 F (37.2 C) (10/27 0700) Pulse Rate:  [65-125] 66 (10/27 0700) Resp:  [6-35] 32 (10/27 0813) BP: (106-165)/(38-90) 137/62 mmHg (10/27 0700) SpO2:  [90 %-100 %] 93 % (10/27 0700) FiO2 (%):  [40 %] 40 % (10/27 0813) Weight:  [212 lb 11.9 oz (96.5 kg)] 212 lb 11.9 oz (96.5 kg) (10/27 0400) Last BM Date: 06/21/14 VENT SETTINGS: Vent Mode:  [-] PSV FiO2 (%):  [40 %] 40 % Set Rate:  [16 bmp] 16 bmp Vt Set:  [580 mL] 580 mL PEEP:  [5 cmH20] 5 cmH20 Pressure Support:  [10 cmH20-15 cmH20] 10 cmH20 Plateau Pressure:  [16 cmH20-20 cmH20] 16 cmH20 I/O: Intake/Output     10/26 0701 - 10/27 0700 10/27 0701 - 10/28 0700   I.V. (mL/kg) 1488 (15.4)    Other     NG/GT 830    IV Piggyback 550    Total Intake(mL/kg) 2868 (29.7)    Urine (mL/kg/hr) 4670 (2) 600 (3.6)   Stool 1 (0)    Total Output 4671 600   Net -1803 -600        Stool Occurrence 4 x      PHYSICAL EXAMINATION:  General: no distress, rass 1-2,  following commands at times but periods of agitation continue Neuro: RASS 1- 2, sedated or agitated  HEENT: trach in place clean  Some bloody secretions noted, strong cough Cardiovascular: regular HSR RRR Lungs: slight coarse, decreased in bases Abdomen: Soft,  non-tender, + bowel sounds, TF's Musculoskeletal: no edema Skin: warm/dry  Lab Results: PULMONARY  Recent Labs Lab 06/17/14 1002 06/18/14 0528  PHART 7.366 7.349*  PCO2ART 34.2* 33.8*  PO2ART 72.3* 69.5*  HCO3 19.0* 17.8*  TCO2 18.2 17.1  O2SAT 93.8 90.6    CBC  Recent Labs Lab 06/20/14 0600 06/22/14 0345 06/23/14 0130  HGB 7.4* 7.4* 7.3*  HCT 24.3* 23.2* 23.3*  WBC 11.9* 15.4* 16.4*  PLT 157 147* 155    COAGULATION  Recent Labs Lab 06/18/14 0416 06/23/14 0130  INR 1.52* 1.51*    CARDIAC  No results found for this basename: TROPONINI,   in the last 168 hours No results found for this basename: PROBNP,  in the last 168 hours   CHEMISTRY  Recent Labs Lab 06/17/14 0500 06/18/14 0416  06/19/14 0430 06/20/14 0600 06/21/14 0405 06/22/14 0345 06/23/14 0130  NA 147 141  < > 144 143 142 144 147  K 3.9 4.3  < > 4.4 4.2 4.2 3.9 3.9  CL 116* 112  < > 114* 111 107 109 111  CO2 21 19  < > 19 23 23 23 24   GLUCOSE 147* 133*  < > 148* 143* 187* 183* 170*  BUN 21 23  < > 20 20 21 21 21   CREATININE 1.00 1.06  < > 0.95 0.88 0.87 0.78 0.80  CALCIUM 8.6 8.3*  < > 8.3* 8.5 8.7 8.0* 8.0*  MG 1.8 2.2  --  1.9 1.9 1.7  --   --   PHOS 3.6 3.7  --  3.9 4.4 3.2  --   --   < > = values in this interval not displayed. Estimated Creatinine Clearance: 148.8 ml/min (by C-G formula based on Cr of 0.8).   LIVER  Recent Labs Lab 06/18/14 0416 06/22/14 0345 06/23/14 0130  AST  --  85* 91*  ALT  --  26 30  ALKPHOS  --  193* 214*  BILITOT  --  1.7* 1.6*  PROT  --  7.1 7.0  ALBUMIN  --  2.1* 2.1*  INR 1.52*  --  1.51*     INFECTIOUS  Recent Labs Lab 06/19/14 1200 06/20/14 0530 06/20/14 0600 06/21/14 0405  LATICACIDVEN 1.6 1.2  --   --   PROCALCITON 0.23  --  0.22 0.28     ENDOCRINE CBG (last 3)   Recent Labs  06/22/14 2351 06/23/14 0344 06/23/14 0731  GLUCAP 176* 168* 153*     IMAGING x48h Dg Chest Port 1 View  06/23/2014   CLINICAL DATA:  Respiratory failure.  EXAM: PORTABLE CHEST - 1 VIEW  COMPARISON:  06/22/2014, 06/20/2014, and 06/19/2014  FINDINGS: Tracheostomy tube and feeding tube remain in place.  Pulmonary edema at the right base has slightly worsened. Pulmonary edema on left is stable with persistent consolidation at the left lung base.  Persistent pulmonary vascular congestion.  IMPRESSION: Slight worsening of pulmonary edema at the right base. No other change.   Electronically Signed   By: Geanie Cooley M.D.   On: 06/23/2014 07:15   Dg Chest Port 1 View  06/22/2014   CLINICAL DATA:  38 year old with  fever and profound anemia. Patient has a tracheostomy tube.  EXAM: PORTABLE CHEST - 1 VIEW  COMPARISON:  06/20/2014  FINDINGS: There is a tracheostomy tube and feeding tube. The feeding tube tip is beyond the image. Perihilar densities may represent edema. There is improved aeration in the left lung. Persistent densities at left lung base suggest  pleural fluid and volume loss/consolidation. Heart size is grossly stable.  IMPRESSION: Evidence for mild pulmonary edema with slightly improved aeration in the left lung.  Persistent densities at the left lung base as described.   Electronically Signed   By: Richarda OverlieAdam  Henn M.D.   On: 06/22/2014 07:45    Intake/Output Summary (Last 24 hours) at 06/23/14 0845 Last data filed at 06/23/14 0716  Gross per 24 hour  Intake 2700.11 ml  Output   3971 ml  Net -1270.89 ml   ASSESSMENT / PLAN:  PULMONARY  OETT 10/12 >>10/20 self extubation, re intubated 10/20>>10/22, Trach #6 (DF)>>  A:   - Acute on chronic hypoxic respiratory failure 2nd to PNA, and b/l pleural effusions (transudate on Rt).   - Prolonged Mechanical Ventilation due to the above + agitation / encephalopathy P:  Wean per protocol > attempt to go to [facility] on 10/27 Maintain neg balance as tolerated Adjust sedation as needed > this has become largest issue, see neuro section  CARDIOVASCULAR  A:  Hypotension , resolved Hypervolemia P:  - continue to push diuresis - follow QT-c, ECG  RENAL  Lab Results  Component Value Date   CREATININE 0.80 06/23/2014   CREATININE 0.78 06/22/2014   CREATININE 0.87 06/21/2014    Recent Labs Lab 06/21/14 0405 06/22/14 0345 06/23/14 0130  NA 142 144 147    A:  Stage III CKD. Non gap metabolic acidosis ?from diprivan. 06/12/14  Hypernatremia (resolved) Mild hypomag(resolved)   P:  Diuresis, decrease if S Cr bumps Chem daily  GASTROINTESTINAL  A:  Severe protein calorie malnutrition.  ETOH with cirrhosis? Vs hepatitis, improving  P:  Tube  feeds H2 blockade DC xifaxan and lactulose 10/26 Follow LFT  HEMATOLOGIC  Lab Results  Component Value Date   INR 1.51* 06/23/2014   INR 1.52* 06/18/2014   INR 1.40 06/14/2014    A:  Anemia from chronic GI bleeding >> stable at present. P:  Cbc  Follow coags   INFECTIOUS  A:  Right > left PNA. HCAP vs aspiration.  Flu negative. S/p cefepime 7d ending 06/14/14 Fevers 10/24 (infection vs medication induced?)   10/23 urine>>>neg 10/23 blood>>> 10/23 cdiff>>>neg  P:  ceftaz 10/24>>> 10/27 vanc 10/25>>>10/27   - d/c abx and follow clinically 10/27  ENDOCRINE  A:  DMII with nephropathy. CBG (last 3)   Recent Labs  06/22/14 2351 06/23/14 0344 06/23/14 0731  GLUCAP 176* 168* 153*   P:  SSI Add low dose lantus  NEUROLOGIC  A:  Acute encephalopathy 2nd to delirium tremens, respiratory failure, meds, critical illness. Possible associted hepatic encephalopathy +  With high Ammonia.  MS actually a bit better on 10/27 after increase in precedex (and addition of Risperdal + valproate, too early to contribute??)  P:  - RASS goal 0 - D/c'd LACTULOSE and XIFAXAN on 10/26 as hepatic encephalopathy unlikely - Continue precedex gtt, max to 3.0 - Decrease scheduled ativan; once appropriate ativan dose determined (as precedex decreases as well) will likely convert to clonazepam - Continue clonidine - Risperdal 2mg  BID; if fevers continued without source, then stop - Fentanyl patch in place, decrease to 75 on 10/27 - Haldol prn - 10/26 added Depakote  - Foot drop boots   Family, parents,updated at bedside 10/27  [facility] Minor ACNP [facility] Pager [phone] till 3 pm If no answer page [phone] 06/23/2014, 8:43 AM   Independent CC time 35 minutes.   Levy Pupaobert Byrum, MD, PhD 06/23/2014, 1:00 PM [facility] [phone] or if no answer [phone]

## 2014-06-23 NOTE — Progress Notes (Signed)
eLink Physician-Brief Progress Note Patient Name: Jerome BodyWilliam [name] DOB: 08/23/1976 MRN: [id number]   Date of Service  06/23/2014  HPI/Events of Note  Continued agitation despite 2.6 mcg precedex and prn ativan in addition to anti-psychotics.  Had been on versed gtt at 10 until this AM.  eICU Interventions  Plan: PRN versed Continue to monitor     Intervention Category Major Interventions: Delirium, psychosis, severe agitation - evaluation and management  DETERDING,ELIZABETH 06/23/2014, 1:12 AM

## 2014-06-23 NOTE — Progress Notes (Signed)
Wasted 50 ml of versed drip with [place] BreedingPam West, RN, Press photographerCharge nurse.

## 2014-06-23 NOTE — Progress Notes (Signed)
During the night, patient has continued to remain severely agitated. He kicks his legs over the bed, sits up in the bed and is regularly difficult to physically manage with sitter and RN together.  Elink viewed room when charge RN was in there and patient was attempting to get out of bed.  Precedex has had to be increased and pt did receive new orders for Versed. Pt still remains agitated and makes attempts to get OOB and resists nursing help.

## 2014-06-24 ENCOUNTER — Inpatient Hospital Stay (HOSPITAL_COMMUNITY): Payer: BC Managed Care – PPO

## 2014-06-24 DIAGNOSIS — Z930 Tracheostomy status: Secondary | ICD-10-CM

## 2014-06-24 LAB — GLUCOSE, CAPILLARY
GLUCOSE-CAPILLARY: 147 mg/dL — AB (ref 70–99)
GLUCOSE-CAPILLARY: 195 mg/dL — AB (ref 70–99)
GLUCOSE-CAPILLARY: 220 mg/dL — AB (ref 70–99)
GLUCOSE-CAPILLARY: 204 mg/dL — AB (ref 70–99)
Glucose-Capillary: 202 mg/dL — ABNORMAL HIGH (ref 70–99)
Glucose-Capillary: 180 mg/dL — ABNORMAL HIGH (ref 70–99)

## 2014-06-24 LAB — BASIC METABOLIC PANEL
Anion gap: 13 (ref 5–15)
BUN: 20 mg/dL (ref 6–23)
CHLORIDE: 110 meq/L (ref 96–112)
CO2: 23 meq/L (ref 19–32)
Calcium: 8.2 mg/dL — ABNORMAL LOW (ref 8.4–10.5)
Creatinine, Ser: 0.87 mg/dL (ref 0.50–1.35)
GFR calc non Af Amer: >90 mL/min (ref >90)
Glucose, Bld: 189 mg/dL — ABNORMAL HIGH (ref 70–99)
POTASSIUM: 3.9 meq/L (ref 3.7–5.3)
Sodium: 146 mEq/L (ref 137–147)

## 2014-06-24 LAB — CBC
HCT: 24.1 % — ABNORMAL LOW (ref 39.0–52.0)
Hemoglobin: 7.3 g/dL — ABNORMAL LOW (ref 13.0–17.0)
MCH: 25.4 pg — ABNORMAL LOW (ref 26.0–34.0)
MCHC: 30.3 g/dL (ref 30.0–36.0)
MCV: 84 fL (ref 78.0–100.0)
PLATELETS: 171 10*3/uL (ref 150–400)
RBC: 2.87 MIL/uL — ABNORMAL LOW (ref 4.22–5.81)
RDW: 23.7 % — ABNORMAL HIGH (ref 11.5–15.5)
WBC: 17.8 10*3/uL — AB (ref 4.0–10.5)

## 2014-06-24 LAB — PROTIME-INR
INR: 1.53 — AB (ref 0.00–1.49)
PROTHROMBIN TIME: 18.5 s — AB (ref 11.6–15.2)

## 2014-06-24 MED ORDER — INSULIN GLARGINE 100 UNIT/ML ~~LOC~~ SOLN
10.0000 [IU] | Freq: Every day | SUBCUTANEOUS | Status: DC
  Administered 2014-06-25 – 2014-07-04 (×10): 10 [IU] via SUBCUTANEOUS
  Filled 2014-06-24 (×10): qty 0.1

## 2014-06-24 MED ORDER — FENTANYL 50 MCG/HR TD PT72: 50.0000 ug | MEDICATED_PATCH | TRANSDERMAL | Status: DC

## 2014-06-24 MED ORDER — FENTANYL 100 MCG/HR TD PT72: 100.0000 ug | MEDICATED_PATCH | TRANSDERMAL | Status: DC

## 2014-06-24 MED ORDER — LORAZEPAM 2 MG/ML PO CONC: 1.0000 mg | ORAL | Status: DC | PRN

## 2014-06-24 MED ORDER — FENTANYL 50 MCG/HR TD PT72
50.0000 ug | MEDICATED_PATCH | TRANSDERMAL | Status: DC
  Administered 2014-06-24: 50 ug via TRANSDERMAL
  Filled 2014-06-24: qty 1

## 2014-06-24 MED ORDER — VITAL AF 1.2 CAL PO LIQD
1000.0000 mL | ORAL | Status: DC
  Administered 2014-06-24: 1000 mL
  Filled 2014-06-24 (×2): qty 1000

## 2014-06-24 MED ORDER — LORAZEPAM 2 MG/ML PO CONC
1.0000 mg | ORAL | Status: DC
  Administered 2014-06-24 – 2014-06-27 (×22): 1 mg
  Filled 2014-06-24 (×23): qty 1

## 2014-06-24 MED ORDER — RISPERIDONE 1 MG/ML PO SOLN
2.0000 mg | Freq: Two times a day (BID) | ORAL | Status: DC
  Administered 2014-06-24 – 2014-07-05 (×22): 2 mg via ORAL
  Filled 2014-06-24 (×26): qty 2

## 2014-06-24 NOTE — Progress Notes (Signed)
NUTRITION FOLLOW UP  Intervention:    -As pt no longer on ARDS protocol; modify TF to Vital AF 1.2  -Initiate Vital AF 1.2 at 50 ml/hr and increase by 10 ml every 4 hours until goal rate of 65 ml/hr to provide 1872 kcal (100% est kcal needs), 117 gram protein (100% est protein needs) and 1265 ml free fluid -Discontinue Pro-Stat -RD to continue to follow  Nutrition Dx:   Inadequate oral intake related to inability to eat as evidenced by npo status; ongoing   Goal:   Diet advancement. Patient to meet estimated needs with meals and supplements.   New Goal: TF to meet >/= 90% of their estimated nutrition needs; not met with trickle feeds    Monitor:   TF tolerance, total protein/energy intake, labs, weights, diet order, sedation  Assessment:   10/12:  NPO on c pap. Currently being intubated.  Ate 1/2 omelet Saturday. Mother reports that patient has been afraid of eating for some time for fear of vomiting.  Patient last seen by RD 03/20/14. Diagnosed with severe malnutrition at that time due to weight loss and poor intake.  Weight has increased from 190 lbs in July to 208 lbs currently. Probably fluid gain.  Intake has been poor prior to admit.  Patient is currently intubated on ventilator support  MV: 11.6 L/min  Temp (24hrs), Avg:99 F (37.2 C), Min:98.3 F (36.8 C), Max:99.7 F (37.6 C)   Propofol: 20 ml/hr   10/13: Patient remains intubated on ventilator support MV: 11.2 L/min Temp (24hrs), Avg:100 F (37.8 C), Min:99.3 F (37.4 C), Max:100.4 F (38 C)  Propofol: 36.8 ml/hr  Vital AF 1.2 initiated at 20 ml/hr on 10/13 with goal rate of 60 ml/hr. RD to order adult enteral protocol.  MD noted pt increasingly agitated on ventilator. Propofol level increased, providing approximately 972 kcal/day. Low K levels being repleted. Weight increased 8 lbs since admit, likely d/t fluid Pt s/p thoracentesis with 500 ml fluid removal  10/15: -Pt's tube feeding on hold d/t  elevated gastric residuals.  -Vital AF 1.2 had been running at 25 ml/hr. On 10/14, residuals > 500. OGT clamped. Residuals 350 ml on 10/15, bile looking appearance per RN -Per discussion with RN, pt's tube feed to be held for 24 hours before restarting. Gastric residuals likely related to sedation, and bowel function will likely improve with sedation weaning. RN noted only minimal bowel sounds -Propofol was decreased earlier this morning to 19.8 ml/hr for WUA; and then was increased back to 36.8 ml/hr  -MD recommended f/u abd xray -Continue with current TF recommendation to be reinitiated per MD discretion, will monitor propofol levels/possible weaning and adjust as warranted -Total bili elevated, likely d/t cirrhosis of liver and hx of ETOH -Weight increased from 40 lb from admit, likely d/t fluid as pt with +2 generalized edema and +5 L fluid balance  10/16: -Received consult to initiate TF. Per discussion with RN, MD requesting trickle feeds at this time to assess tolerance, and increase to goal rate when able -Pharm noted pt with elevated triglycerides (> 400). Per RN, propofol is d/c and Versed will be used for sedation -Phosphorus slightly low at 2.1, K/Mg WNL  10/19: -Pt with elevated ammonia, lactulose ordered TID.  -Assisted in improving constipation; however resulted in ongoing loose stools. Plan to decrease lactulose and add H2 blockade -Oxepa to continue as 20 ml/hr trickle feeds until loose stools episodes decrease. 60 ml residuals per RN  -Pt being diuresed, -2 L  fluid balance, and only 4 lbs increased from admit weight. -Mg repleted, Phos/Mg/K all WNL  10/21: -Pt receiving Oxepa at 30 ml/hr, which provides 1080 kcal (57% est kcal needs), and 45 gram protein (66% re-est protein needs) -Ordered Pro-Stat 30 ml once daily to provide 100 kcal, 15 gram protein -26ml residuals noted, continues with loose stools d/t lactulose. - Lactulose not decreased d/t continued hepatic  encephalopathy. Modified protein requirements, and will increase as warranted -Phos/K//Mg WNL, glucose slightly elevated -Pt self extubated on 10/20, and was reintubated. Plan for trach placement on 10/22  10/23: -Pt s/p trach placement on 10/22 -Continues to receive Oxepa at 30 ml/hr, 0 ml residuals -Continues with loose stools, rectal tube placed on 10/22. MD plan to decrease lactulose. Currently ordered TID  10/28: -Tolerating Oxepa at goal rate of 50 ml/hr with prostat 30 ml once daily, providing 1900 kcal (100% est kcal needs), and 90 gram protein (90% est protein needs) -Per MD, pt no longer requiring full ARDS protocol -Will modify to Vital AF 1.2 -Edema improving, net +1.5 L  Height: Ht Readings from Last 1 Encounters:  06/20/14 $RemoveB'5\' 10"'FCabqcgR$  (1.778 m)    Weight Status:   Wt Readings from Last 1 Encounters:  06/24/14 210 lb 5.1 oz (95.4 kg)  06/05/14 208 lb (94.5 kg)  Re-estimated needs:  Kcal: 1883 Protein: 100-115 gram Fluid: 2.4L  Skin: +1 generalized edema, +2 perineal edema  Diet Order: NPO   Intake/Output Summary (Last 24 hours) at 06/24/14 0935 Last data filed at 06/24/14 0900  Gross per 24 hour  Intake 3366.5 ml  Output   1815 ml  Net 1551.5 ml    Last BM: 10/27   Labs:   Recent Labs Lab 06/19/14 0430 06/20/14 0600 06/21/14 0405 06/22/14 0345 06/23/14 0130 06/24/14 0313  NA 144 143 142 144 147 146  K 4.4 4.2 4.2 3.9 3.9 3.9  CL 114* 111 107 109 111 110  CO2 $Re'19 23 23 23 24 23  'Sun$ BUN $R'20 20 21 21 21 20  'GD$ CREATININE 0.95 0.88 0.87 0.78 0.80 0.87  CALCIUM 8.3* 8.5 8.7 8.0* 8.0* 8.2*  MG 1.9 1.9 1.7  --   --   --   PHOS 3.9 4.4 3.2  --   --   --   GLUCOSE 148* 143* 187* 183* 170* 189*    CBG (last 3)   Recent Labs  06/23/14 2023 06/23/14 2314 06/24/14 0802  GLUCAP 212* 191* 202*    Scheduled Meds: . antiseptic oral rinse  7 mL Mouth Rinse QID  . chlorhexidine  15 mL Mouth Rinse BID  . cloNIDine  0.2 mg Oral TID  . feeding supplement  (PRO-STAT SUGAR FREE 64)  30 mL Oral Q24H  . fentaNYL  75 mcg Transdermal Q72H  . furosemide  60 mg Intravenous Q12H  . insulin aspart  0-9 Units Subcutaneous 6 times per day  . insulin glargine  5 Units Subcutaneous Daily  . LORazepam  1 mg Per Tube 4 times per day  . potassium chloride  20 mEq Per Tube BID  . ranitidine  150 mg Oral QHS  . risperiDONE  1 mg Oral BID  . sodium chloride  3 mL Intravenous Q12H  . Valproic Acid  250 mg Oral BID    Continuous Infusions: . sodium chloride 10 mL/hr at 06/24/14 0600  . dexmedetomidine 3 mcg/kg/hr (06/24/14 0900)  . feeding supplement (OXEPA) 1,000 mL (06/24/14 0600)    Atlee Abide MS RD LDN Clinical  Dietitian [phone]

## 2014-06-24 NOTE — Progress Notes (Signed)
PULMONARY / CRITICAL CARE MEDICINE  Name: Jerome [name] MRN: [id number] DOB:  10/28/1975 ADMISSION DATE: 06/05/2014  CONSULTATION DATE: 06/08/2014  REFERRING MD : TRH  CHIEF COMPLAINT: Respiratory Failure     INITIAL PRESENTATION:  38 yo male with hx of ETOH presented with dyspnea/fatigue from profound anemia (Hb 4.5), and received multiple transfusions of PRBC.  Developed progressive respiratory failure with Rt > Lt pulmonary infiltrates, and PCCM assumed care in ICU.  Studies: 10/9 - There is hepatomegaly and splenomegaly consistent with parenchymal<BR>liver disease  SIGNIFICANT EVENTS:  10/09 admitted; PRBC x3, GI consulted 10/10 PRBC x1 10/11 PRBC x1; new onset PNA on chest x ray 10/12: acute hypoxic respiratory failure overnight requiring BiPAP. Alcohol withdrawal requiring precedex gtt. Intubated for decreasing LOC and increasing 02 needs and worsening PNA on CXR.  10/13 bronchoscopy >> respiratory secretions from RLL, friable airways  - 10/13 Rt thoracentesis > 500 ml fluid: transudate  10/14 gastric residuals  - 10/14 Echo >> EF 55 to 60%, PAS 33 mmHg 10/16 acidosis ?from diprivan >> change to versed gtt  06/13/14:  Significant intermittent agitation.  On 45% fio2.hick tenacious ET tube secretions + .  on fent 150mcg and versed 6mg /h gtt. START LACTULOSE AND XIFAXAN 10/19 fio2 50 % and peep 12 on ARDS protocol 10/20 fio2 40% 8 peep. Slow progress 10/20 self extubation with urgent reintubation and fall off bed to floor 10/22 trached (df) 10/23 agitation remains an issue. 10/25 fever, abx started 10/26 sedated or agitated 10/27 agitation remains an issue. Tolerated t collar 4 hours. 10/28 increase t collar as tolerated. Adjustments in anxiety medications.  Intake/Output Summary (Last 24 hours) at 06/24/14 0943 Last data filed at 06/24/14 0936  Gross per 24 hour  Intake 3386.5 ml  Output   1815 ml  Net 1571.5 ml    SUBJECTIVE/OVERNIGHT/INTERVAL HX Slow progress.  Agitation remains and issue with gentle adjustments in sedation.    PHYSICAL EXAM:  Vital signs in last 24 hours: Temp:  [99.3 F (37.4 C)-100.4 F (38 C)] 100 F (37.8 C) (10/28 0900) Pulse Rate:  [49-127] 83 (10/28 0900) Resp:  [18-39] 23 (10/28 0900) BP: (126-167)/(59-126) 143/74 mmHg (10/28 0940) SpO2:  [90 %-98 %] 98 % (10/28 0900) FiO2 (%):  [40 %-50 %] 40 % (10/28 0821) Weight:  [210 lb 5.1 oz (95.4 kg)] 210 lb 5.1 oz (95.4 kg) (10/28 0324) Last BM Date: 06/23/14 VENT SETTINGS: Vent Mode:  [-] PRVC FiO2 (%):  [40 %-50 %] 40 % Set Rate:  [16 bmp] 16 bmp Vt Set:  [580 mL] 580 mL PEEP:  [5 cmH20] 5 cmH20 Plateau Pressure:  [16 cmH20-21 cmH20] 16 cmH20 I/O: Intake/Output     10/27 0701 - 10/28 0700 10/28 0701 - 10/29 0700   I.V. (mL/kg) 1419.1 (14.9) 184.4 (1.9)   NG/GT 1303 100   IV Piggyback 500    Total Intake(mL/kg) 3222.1 (33.8) 284.4 (3)   Urine (mL/kg/hr) 2540 (1.1)    Stool     Total Output 2540     Net +682.1 +284.4        Stool Occurrence 9 x      PHYSICAL EXAMINATION:  General: no distress, rass 1-2,  following commands at times but periods of agitation continue Neuro: RASS 1- 2, sedated or agitated  HEENT: trach in place clean   strong cough Cardiovascular: regular HSR RRR Lungs: slight coarse, decreased in bases Abdomen: Soft, non-tender, + bowel sounds, TF's going Musculoskeletal: no edema Skin: warm/dry  Lab Results: PULMONARY  Recent Labs Lab 06/17/14 1002 06/18/14 0528  PHART 7.366 7.349*  PCO2ART 34.2* 33.8*  PO2ART 72.3* 69.5*  HCO3 19.0* 17.8*  TCO2 18.2 17.1  O2SAT 93.8 90.6    CBC  Recent Labs Lab 06/22/14 0345 06/23/14 0130 06/24/14 0313  HGB 7.4* 7.3* 7.3*  HCT 23.2* 23.3* 24.1*  WBC 15.4* 16.4* 17.8*  PLT 147* 155 171    COAGULATION  Recent Labs Lab 06/18/14 0416 06/23/14 0130 06/24/14 0313  INR 1.52* 1.51* 1.53*    CARDIAC  No results found for this basename: TROPONINI,  in the last 168 hours No  results found for this basename: PROBNP,  in the last 168 hours   CHEMISTRY  Recent Labs Lab 06/18/14 0416  06/19/14 0430 06/20/14 0600 06/21/14 0405 06/22/14 0345 06/23/14 0130 06/24/14 0313  NA 141  < > 144 143 142 144 147 146  K 4.3  < > 4.4 4.2 4.2 3.9 3.9 3.9  CL 112  < > 114* 111 107 109 111 110  CO2 19  < > 19 23 23 23 24 23   GLUCOSE 133*  < > 148* 143* 187* 183* 170* 189*  BUN 23  < > 20 20 21 21 21 20   CREATININE 1.06  < > 0.95 0.88 0.87 0.78 0.80 0.87  CALCIUM 8.3*  < > 8.3* 8.5 8.7 8.0* 8.0* 8.2*  MG 2.2  --  1.9 1.9 1.7  --   --   --   PHOS 3.7  --  3.9 4.4 3.2  --   --   --   < > = values in this interval not displayed. Estimated Creatinine Clearance: 136.1 ml/min (by C-G formula based on Cr of 0.87).   LIVER  Recent Labs Lab 06/18/14 0416 06/22/14 0345 06/23/14 0130 06/24/14 0313  AST  --  85* 91*  --   ALT  --  26 30  --   ALKPHOS  --  193* 214*  --   BILITOT  --  1.7* 1.6*  --   PROT  --  7.1 7.0  --   ALBUMIN  --  2.1* 2.1*  --   INR 1.52*  --  1.51* 1.53*     INFECTIOUS  Recent Labs Lab 06/19/14 1200 06/20/14 0530 06/20/14 0600 06/21/14 0405  LATICACIDVEN 1.6 1.2  --   --   PROCALCITON 0.23  --  0.22 0.28     ENDOCRINE CBG (last 3)   Recent Labs  06/23/14 2023 06/23/14 2314 06/24/14 0802  GLUCAP 212* 191* 202*     IMAGING x48h Dg Chest Port 1 View  06/24/2014   CLINICAL DATA:  Respiratory failure  EXAM: PORTABLE CHEST - 1 VIEW  COMPARISON:  06/23/2014  FINDINGS: Tracheostomy tube. Enteric tube descends below the level of the image. Prominent cardiomediastinal contours. Interstitial prominence and mild perihilar hazy airspace opacities, similar to mildly improved. No confluent airspace opacity. No definite pleural effusion. No pneumothorax. Sequelae of prior posterior left sixth and seventh rib fractures.  IMPRESSION: Unchanged support devices.  Similar to mild improvement in the pulmonary edema pattern.   Electronically  Signed   By: Jearld LeschAndrew  DelGaizo M.D.   On: 06/24/2014 06:17   Dg Chest Port 1 View  06/23/2014   CLINICAL DATA:  Respiratory failure.  EXAM: PORTABLE CHEST - 1 VIEW  COMPARISON:  06/22/2014, 06/20/2014, and 06/19/2014  FINDINGS: Tracheostomy tube and feeding tube remain in place.  Pulmonary edema at the right base has slightly worsened. Pulmonary edema on left  is stable with persistent consolidation at the left lung base.  Persistent pulmonary vascular congestion.  IMPRESSION: Slight worsening of pulmonary edema at the right base. No other change.   Electronically Signed   By: Geanie Cooley M.D.   On: 06/23/2014 07:15    Intake/Output Summary (Last 24 hours) at 06/24/14 0943 Last data filed at 06/24/14 0936  Gross per 24 hour  Intake 3386.5 ml  Output   1815 ml  Net 1571.5 ml   ASSESSMENT / PLAN:  PULMONARY  OETT 10/12 >>10/20 self extubation, re intubated 10/20>>10/22, Trach #6 (DF)>>  A:   - Acute on chronic hypoxic respiratory failure 2nd to PNA, and b/l pleural effusions (transudate on Rt).   - Prolonged Mechanical Ventilation due to the above + agitation / encephalopathy P:  Wean per protocol > attempt to go to ATC on 10/28 as long as tolerated Maintain neg balance as tolerated Adjust sedation as needed > this has become largest issue, see neuro section  CARDIOVASCULAR  A:  Hypotension , resolved Hypervolemia P:  - continue to push diuresis - follow QT-c, ECG > will repeat 10/29  RENAL  Lab Results  Component Value Date   CREATININE 0.87 06/24/2014   CREATININE 0.80 06/23/2014   CREATININE 0.78 06/22/2014    Recent Labs Lab 06/22/14 0345 06/23/14 0130 06/24/14 0313  NA 144 147 146    A:  Stage III CKD. Non gap metabolic acidosis, (resolved) Hypernatremia (resolved) Mild hypomag(resolved)   P:  Diuresis, decrease if S Cr bumps Chem daily  GASTROINTESTINAL  A:  Severe protein calorie malnutrition.  ETOH with cirrhosis? 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 Hepatic coagulopathy P:  Cbc  Follow coags   INFECTIOUS  A:  Right > left PNA. HCAP vs aspiration.  Flu negative. S/p cefepime 7d ending 06/14/14 Fevers 10/24 (infection vs medication induced?)  10/23 urine>>>neg 10/23 blood>>> 10/23 cdiff>>>neg  P:  ceftaz 10/24>>> 10/27 vanc 10/25>>>10/27   - d/c abx and follow clinically 10/27  ENDOCRINE  A:  DMII with nephropathy. CBG (last 3)   Recent Labs  06/23/14 2023 06/23/14 2314 06/24/14 0802  GLUCAP 212* 191* 202*   P:  SSI Add low dose lantus  NEUROLOGIC  A:  Acute encephalopathy 2nd to delirium tremens, respiratory failure, meds, critical illness. Possible associted hepatic encephalopathy +  With high Ammonia.  MS actually a bit better on 10/27 after increase in precedex (and addition of Risperdal + valproate, too early to contribute??). He remains frequently agitated   P:  - RASS goal 0 - D/c'd LACTULOSE and XIFAXAN on 10/26 as hepatic encephalopathy unlikely - Continue precedex gtt, max to 3.0, will titrate down once we have better control of his agitation  - Increase scheduled ativan; once appropriate ativan dose determined (as precedex decreases as well) will likely convert to clonazepam - Continue clonidine - Risperdal 2mg  BID, increased 10/28 - Fentanyl patch in place, decrease to 50 on 10/28 - Haldol prn - 10/26 added Depakote  - Foot drop boots   Family, parents,updated at bedside 10/28 Discussion:   Agitation remains driving issue. Trying to achieve balance of adequate sedation with function.   Brett Canales Minor ACNP [facility] PCCM Pager [phone]  till 3 pm If no answer page [phone] 06/24/2014, 9:43 AM  Attending Note:  I have examined the pt,  reviewed notes, studies. I agree with the plans as amended above. He was calm on my evaluation this am, some spontaneous purposeful movement, strong cough with some mild tachypnea. He has reportedly been much more agitated at various times.  I salute and appreciate the effort of RN's and CNA's to keep him safe - goal will be to titrate up meds for agitation today including risperdol and scheduled ativan, try to slowly decrease narcs that may be contributing to confusion. Independent CC time 35 minutes.   Levy Pupa, MD, PhD 06/24/2014, 10:53 AM [facility] [phone] or if no answer [phone]

## 2014-06-24 NOTE — Progress Notes (Signed)
Rt placed pt on Trach collar. Pt has increase WOB, RR 37. Pt placed back on vent.

## 2014-06-25 ENCOUNTER — Inpatient Hospital Stay (HOSPITAL_COMMUNITY): Payer: BC Managed Care – PPO

## 2014-06-25 LAB — BASIC METABOLIC PANEL
Anion gap: 12 (ref 5–15)
BUN: 25 mg/dL — AB (ref 6–23)
CHLORIDE: 112 meq/L (ref 96–112)
CO2: 24 mEq/L (ref 19–32)
CREATININE: 0.88 mg/dL (ref 0.50–1.35)
Calcium: 8.4 mg/dL (ref 8.4–10.5)
GFR calc Af Amer: >90 mL/min (ref >90)
Glucose, Bld: 168 mg/dL — ABNORMAL HIGH (ref 70–99)
POTASSIUM: 3.8 meq/L (ref 3.7–5.3)
Sodium: 148 mEq/L — ABNORMAL HIGH (ref 137–147)

## 2014-06-25 LAB — CULTURE, BLOOD (ROUTINE X 2)
Culture: NO GROWTH
Culture: NO GROWTH

## 2014-06-25 LAB — GLUCOSE, CAPILLARY
GLUCOSE-CAPILLARY: 157 mg/dL — AB (ref 70–99)
GLUCOSE-CAPILLARY: 202 mg/dL — AB (ref 70–99)
GLUCOSE-CAPILLARY: 179 mg/dL — AB (ref 70–99)
Glucose-Capillary: 173 mg/dL — ABNORMAL HIGH (ref 70–99)
Glucose-Capillary: 196 mg/dL — ABNORMAL HIGH (ref 70–99)
Glucose-Capillary: 204 mg/dL — ABNORMAL HIGH (ref 70–99)
Glucose-Capillary: 225 mg/dL — ABNORMAL HIGH (ref 70–99)

## 2014-06-25 LAB — CBC
HEMATOCRIT: 24.1 % — AB (ref 39.0–52.0)
Hemoglobin: 7.5 g/dL — ABNORMAL LOW (ref 13.0–17.0)
MCH: 26.1 pg (ref 26.0–34.0)
MCHC: 31.1 g/dL (ref 30.0–36.0)
MCV: 84 fL (ref 78.0–100.0)
PLATELETS: 187 10*3/uL (ref 150–400)
RBC: 2.87 MIL/uL — ABNORMAL LOW (ref 4.22–5.81)
RDW: 23.3 % — ABNORMAL HIGH (ref 11.5–15.5)
WBC: 19.7 10*3/uL — AB (ref 4.0–10.5)

## 2014-06-25 LAB — PHOSPHORUS: Phosphorus: 3.3 mg/dL (ref 2.3–4.6)

## 2014-06-25 LAB — STREP PNEUMONIAE URINARY ANTIGEN: STREP PNEUMO URINARY ANTIGEN: NEGATIVE

## 2014-06-25 LAB — MAGNESIUM: MAGNESIUM: 1.8 mg/dL (ref 1.5–2.5)

## 2014-06-25 MED ORDER — VITAL AF 1.2 CAL PO LIQD
1000.0000 mL | ORAL | Status: DC
  Administered 2014-06-26 – 2014-06-30 (×4): 1000 mL
  Filled 2014-06-25 (×7): qty 1000

## 2014-06-25 MED ORDER — PRO-STAT SUGAR FREE PO LIQD
30.0000 mL | Freq: Two times a day (BID) | ORAL | Status: DC
  Administered 2014-06-25 – 2014-07-02 (×16): 30 mL
  Filled 2014-06-25 (×31): qty 30

## 2014-06-25 NOTE — Progress Notes (Signed)
   Panda occluded  Plan Replace panda   Dr. Kalman ShanMurali Musa Rewerts, M.D., [facility]P Pulmonary and Critical Care Medicine Staff Physician [facility] Pager: [phone], If no answer or between  15:00h - 7:00h: call [phone]  06/25/2014 3:00 AM

## 2014-06-25 NOTE — Progress Notes (Signed)
Panda tube occluded. Unable to flush or pull back. Will continue to attempt to flush. Bosie HelperS. Shannia Jacuinde, RN

## 2014-06-25 NOTE — Progress Notes (Signed)
PULMONARY / CRITICAL CARE MEDICINE  Name: Jerome [name] MRN: [id number] DOB:  10/28/1975 ADMISSION DATE: 06/05/2014  CONSULTATION DATE: 06/08/2014  REFERRING MD : TRH  CHIEF COMPLAINT: Respiratory Failure     INITIAL PRESENTATION:  38 yo male with hx of ETOH presented with dyspnea/fatigue from profound anemia (Hb 4.5), and received multiple transfusions of PRBC.  Developed progressive respiratory failure with Rt > Lt pulmonary infiltrates, and PCCM assumed care in ICU.  Studies: 10/9 - There is hepatomegaly and splenomegaly consistent with parenchymal<BR>liver disease  SIGNIFICANT EVENTS:  10/09 admitted; PRBC x3, GI consulted 10/10 PRBC x1 10/11 PRBC x1; new onset PNA on chest x ray 10/12: acute hypoxic respiratory failure overnight requiring BiPAP. Alcohol withdrawal requiring precedex gtt. Intubated for decreasing LOC and increasing 02 needs and worsening PNA on CXR.  10/13 bronchoscopy >> respiratory secretions from RLL, friable airways  - 10/13 Rt thoracentesis > 500 ml fluid: transudate  10/14 gastric residuals  - 10/14 Echo >> EF 55 to 60%, PAS 33 mmHg 10/16 acidosis ?from diprivan >> change to versed gtt  06/13/14:  Significant intermittent agitation.  On 45% fio2.hick tenacious ET tube secretions + .  on fent 150mcg and versed 6mg /h gtt. START LACTULOSE AND XIFAXAN 10/19 fio2 50 % and peep 12 on ARDS protocol 10/20 fio2 40% 8 peep. Slow progress 10/20 self extubation with urgent reintubation and fall off bed to floor 10/22 trached (df) 10/23 agitation remains an issue. 10/25 fever, abx started 10/26 sedated or agitated 10/27 agitation remains an issue. Tolerated t collar 4 hours. 10/28 increase t collar as tolerated. Adjustments in anxiety medications. 10/29 will seek LTAC   Intake/Output Summary (Last 24 hours) at 06/25/14 1023 Last data filed at 06/25/14 0900  Gross per 24 hour  Intake 3133.6 ml  Output   3840 ml  Net -706.4 ml     SUBJECTIVE/OVERNIGHT/INTERVAL HX Slow progress. Agitation remains and issue with gentle adjustments in sedation.  Agitated when on ATC, tolerating some high PSV  PHYSICAL EXAM:  Vital signs in last 24 hours: Temp:  [100.4 F (38 C)-101.8 F (38.8 C)] 101.5 F (38.6 C) (10/29 1000) Pulse Rate:  [70-111] 80 (10/29 1000) Resp:  [18-37] 21 (10/29 1000) BP: (119-157)/(64-94) 151/74 mmHg (10/29 1017) SpO2:  [84 %-100 %] 92 % (10/29 1000) FiO2 (%):  [40 %-60 %] 40 % (10/29 0807) Last BM Date: 06/23/14 VENT SETTINGS: Vent Mode:  [-] PRVC FiO2 (%):  [40 %-60 %] 40 % Set Rate:  [16 bmp] 16 bmp Vt Set:  [580 mL] 580 mL PEEP:  [5 cmH20] 5 cmH20 Plateau Pressure:  [15 cmH20-19 cmH20] 18 cmH20 I/O: Intake/Output     10/28 0701 - 10/29 0700 10/29 0701 - 10/30 0700   I.V. (mL/kg) 1995.8 (20.9) 174.4 (1.8)   NG/GT 1390 170   IV Piggyback     Total Intake(mL/kg) 3385.8 (35.5) 344.4 (3.6)   Urine (mL/kg/hr) 5190 (2.3)    Total Output 5190     Net -1804.2 +344.4          PHYSICAL EXAMINATION:  General: no distress, rass 1-2,  following commands, better 10/29 but agitation not resolved Neuro: RASS 1- 2, moves ext, intentional, follows commands, communicates HEENT: trach in place clean,  strong cough Cardiovascular: regular HSR RRR Lungs: slight coarse, decreased in bases Abdomen: Soft, mild  Distension, non-tender, + bowel sounds, TF's going Musculoskeletal: no edema Skin: warm/dry  Lab Results: CBC  Recent Labs Lab 06/23/14 0130 06/24/14 0313 06/25/14 0333  HGB 7.3*  7.3* 7.5*  HCT 23.3* 24.1* 24.1*  WBC 16.4* 17.8* 19.7*  PLT 155 171 187    COAGULATION  Recent Labs Lab 06/23/14 0130 06/24/14 0313  INR 1.51* 1.53*    CHEMISTRY  Recent Labs Lab 06/19/14 0430 06/20/14 0600 06/21/14 0405 06/22/14 0345 06/23/14 0130 06/24/14 0313 06/25/14 0333  NA 144 143 142 144 147 146 148*  K 4.4 4.2 4.2 3.9 3.9 3.9 3.8  CL 114* 111 107 109 111 110 112  CO2 19 23 23  23 24 23 24   GLUCOSE 148* 143* 187* 183* 170* 189* 168*  BUN 20 20 21 21 21 20  25*  CREATININE 0.95 0.88 0.87 0.78 0.80 0.87 0.88  CALCIUM 8.3* 8.5 8.7 8.0* 8.0* 8.2* 8.4  MG 1.9 1.9 1.7  --   --   --  1.8  PHOS 3.9 4.4 3.2  --   --   --  3.3   Estimated Creatinine Clearance: 134.6 ml/min (by C-G formula based on Cr of 0.88).   LIVER  Recent Labs Lab 06/22/14 0345 06/23/14 0130 06/24/14 0313  AST 85* 91*  --   ALT 26 30  --   ALKPHOS 193* 214*  --   BILITOT 1.7* 1.6*  --   PROT 7.1 7.0  --   ALBUMIN 2.1* 2.1*  --   INR  --  1.51* 1.53*     INFECTIOUS  Recent Labs Lab 06/19/14 1200 06/20/14 0530 06/20/14 0600 06/21/14 0405  LATICACIDVEN 1.6 1.2  --   --   PROCALCITON 0.23  --  0.22 0.28     ENDOCRINE CBG (last 3)   Recent Labs  06/24/14 1957 06/25/14 0033 06/25/14 0419  GLUCAP 204* 204* 157*     IMAGING x48h Dg Chest Port 1 View  06/25/2014   CLINICAL DATA:  Respiratory failure, check tracheostomy position  EXAM: PORTABLE CHEST - 1 VIEW  COMPARISON:  06/24/2014  FINDINGS: Tracheostomy tube and feeding catheter are again identified and stable. The cardiac shadow remains enlarged. Increased vascular congestion with pulmonary edema is again identified and appears to have increased somewhat in the interval from the prior exam. No pneumothorax is noted.  IMPRESSION: Increasing pulmonary edema.   Electronically Signed   By: Alcide Clever M.D.   On: 06/25/2014 07:35   Dg Chest Port 1 View  06/24/2014   CLINICAL DATA:  Respiratory failure  EXAM: PORTABLE CHEST - 1 VIEW  COMPARISON:  06/23/2014  FINDINGS: Tracheostomy tube. Enteric tube descends below the level of the image. Prominent cardiomediastinal contours. Interstitial prominence and mild perihilar hazy airspace opacities, similar to mildly improved. No confluent airspace opacity. No definite pleural effusion. No pneumothorax. Sequelae of prior posterior left sixth and seventh rib fractures.  IMPRESSION:  Unchanged support devices.  Similar to mild improvement in the pulmonary edema pattern.   Electronically Signed   By: Jearld Lesch M.D.   On: 06/24/2014 06:17    Intake/Output Summary (Last 24 hours) at 06/25/14 1023 Last data filed at 06/25/14 0900  Gross per 24 hour  Intake 3133.6 ml  Output   3840 ml  Net -706.4 ml   ASSESSMENT / PLAN:  PULMONARY  OETT 10/12 >>10/20 self extubation, re intubated 10/20>>10/22, Trach #6 (DF)>>  A:   - Acute on chronic hypoxic respiratory failure 2nd to PNA, and b/l pleural effusions (transudate on Rt).   - Prolonged Mechanical Ventilation due to the above + agitation / encephalopathy P:  Wean per protocol > continue efforts at ATC, PSV  if not tolerated Maintain neg balance as tolerated Adjust sedation as needed > this has become largest issue, see neuro section  CARDIOVASCULAR  A:  Hypotension , resolved Hypervolemia P:  - continue to push diuresis - follow QT-c  RENAL  Lab Results  Component Value Date   CREATININE 0.88 06/25/2014   CREATININE 0.87 06/24/2014   CREATININE 0.80 06/23/2014    Recent Labs Lab 06/23/14 0130 06/24/14 0313 06/25/14 0333  NA 147 146 148*    A:  Stage III CKD. Non gap metabolic acidosis, (resolved) Hypernatremia (resolved) Mild hypomag(resolved)   P:  Diuresis, decrease if S Cr bumps Chem daily  GASTROINTESTINAL  A:  Severe protein calorie malnutrition. ETOH with cirrhosis? Vs hepatitis, improving  P:  Tube feeds H2 blockade DC xifaxan and lactulose 10/26 Follow LFT  HEMATOLOGIC  Lab Results  Component Value Date   INR 1.53* 06/24/2014   INR 1.51* 06/23/2014   INR 1.52* 06/18/2014    A:  Anemia from chronic GI bleeding >> stable at present. Hepatic coagulopathy P:  Cbc  Follow coags   INFECTIOUS  A:  Right > left PNA. HCAP vs aspiration.  Flu negative. S/p cefepime 7d ending 06/14/14 Fevers 10/24 (infection vs medication induced?)  10/23 urine>>>neg 10/23  blood>>>neg 10/23 cdiff>>>neg 10/29 bc x 2>> 10/29uc>> 10/29 sputum>>  P:  ceftaz 10/24>>> 10/27 vanc 10/25>>>10/27   - d/c abx and follow clinically 10/27 10/29 fevers noted. We will pan culture but hold abx for now. He is on Risperdal and we may need to decrease or stop in future.    ENDOCRINE  A:  DMII with nephropathy. CBG (last 3)   Recent Labs  06/24/14 1957 06/25/14 0033 06/25/14 0419  GLUCAP 204* 204* 157*   P:  SSI Add low dose lantus  NEUROLOGIC  A:  Acute encephalopathy 2nd to delirium tremens, respiratory failure, meds, critical illness. Possible associted hepatic encephalopathy +  With high Ammonia.  MS actually a bit better on 10/27 after increase in precedex (and addition of Risperdal + valproate, too early to contribute??). He remains frequently agitated   P:  - RASS goal 0 -1  - Continue precedex gtt, max to 3.0, will titrate down once we have better control of his agitation  - Scheduled ativan; once appropriate ativan dose determined (as precedex decreases as well) will likely convert to clonazepam - Continue clonidine - Risperdal 2mg  BID, increased 10/28 - Fentanyl patch in place, decreased to 50 on 10/28, will decrease again 10/30 - Haldol prn - 10/26 added Depakote  - Foot drop boots   Family, parents,updated at bedside 10/28, 10/29 Discussion:   Agitation remains driving issue. Trying to achieve balance of adequate sedation with function. We will seek LTAC and family is OK with that plan. Follow fever curve, we may need to start abx and dc  Risperdal.    Jerome [name] ACNP Jerome [name] PCCM Pager [phone] till 3 pm If no answer page [phone] 06/25/2014, 10:23 AM   Jerome Pupaobert Oddie Kuhlmann, MD, PhD 06/25/2014, 12:07 PM [facility] [phone] or if no answer [phone]

## 2014-06-25 NOTE — Progress Notes (Signed)
Panda remains occluded despite efforts to flush and clear. Bosie HelperS Madicyn Mesina, RN

## 2014-06-25 NOTE — Progress Notes (Signed)
Nutrition Note  10/28: RN contacted RD d/t concern for abd distension and increasing TF volume with modification of formulas. RD recommended to maintain Vital AF 1.2 at 50 ml/hr until distension improves. Ordered Pro-Stat 30 ml BID to better meet estimated nutrition needs:  Intervention: -Vital AF 1.2 at new goal rate of 50 ml/hr with Pro-Stat 30 ml BID will provide 1640 kcal (87% est kcal needs), 120 gram protein (100% est protein needs), 973 ml  Following per protocols. Please re-consult as needed.  Lloyd HugerSarah F Evertte Sones MS RD LDN Clinical Dietitian [phone]

## 2014-06-25 NOTE — Progress Notes (Signed)
Successful attempt at flushing Panda tube prior to placing new tube. Placement was confirmed via auscultation. Feeding was restarted and MD on call notified via E-link. Bosie HelperS Kharlie Bring, RN

## 2014-06-25 NOTE — Progress Notes (Signed)
Notified MD on call via E-link about occluded Panda tube. Orders to replace Panda were given. Bosie HelperS Mikaele Stecher, RN

## 2014-06-26 ENCOUNTER — Inpatient Hospital Stay (HOSPITAL_COMMUNITY): Payer: BC Managed Care – PPO

## 2014-06-26 LAB — CBC
HEMATOCRIT: 22.8 % — AB (ref 39.0–52.0)
HEMOGLOBIN: 7 g/dL — AB (ref 13.0–17.0)
MCH: 25.5 pg — AB (ref 26.0–34.0)
MCHC: 30.7 g/dL (ref 30.0–36.0)
MCV: 83.2 fL (ref 78.0–100.0)
Platelets: 176 10*3/uL (ref 150–400)
RBC: 2.74 MIL/uL — AB (ref 4.22–5.81)
RDW: 23.7 % — ABNORMAL HIGH (ref 11.5–15.5)
WBC: 17.6 10*3/uL — ABNORMAL HIGH (ref 4.0–10.5)

## 2014-06-26 LAB — GLUCOSE, CAPILLARY
GLUCOSE-CAPILLARY: 188 mg/dL — AB (ref 70–99)
Glucose-Capillary: 175 mg/dL — ABNORMAL HIGH (ref 70–99)
Glucose-Capillary: 182 mg/dL — ABNORMAL HIGH (ref 70–99)
Glucose-Capillary: 154 mg/dL — ABNORMAL HIGH (ref 70–99)
Glucose-Capillary: 171 mg/dL — ABNORMAL HIGH (ref 70–99)
Glucose-Capillary: 139 mg/dL — ABNORMAL HIGH (ref 70–99)

## 2014-06-26 LAB — LEGIONELLA ANTIGEN, URINE

## 2014-06-26 LAB — BASIC METABOLIC PANEL
Anion gap: 13 (ref 5–15)
BUN: 28 mg/dL — AB (ref 6–23)
CALCIUM: 8.5 mg/dL (ref 8.4–10.5)
CO2: 24 mEq/L (ref 19–32)
Chloride: 115 mEq/L — ABNORMAL HIGH (ref 96–112)
Creatinine, Ser: 0.86 mg/dL (ref 0.50–1.35)
GFR calc Af Amer: >90 mL/min (ref >90)
GFR calc non Af Amer: >90 mL/min (ref >90)
GLUCOSE: 193 mg/dL — AB (ref 70–99)
Potassium: 3.6 mEq/L — ABNORMAL LOW (ref 3.7–5.3)
Sodium: 152 mEq/L — ABNORMAL HIGH (ref 137–147)

## 2014-06-26 LAB — CLOSTRIDIUM DIFFICILE BY PCR: CDIFFPCR: NEGATIVE

## 2014-06-26 MED ORDER — VANCOMYCIN HCL 10 G IV SOLR
1500.0000 mg | Freq: Three times a day (TID) | INTRAVENOUS | Status: DC
  Administered 2014-06-26 – 2014-06-28 (×7): 1500 mg via INTRAVENOUS
  Filled 2014-06-26 (×8): qty 1500

## 2014-06-26 MED ORDER — VANCOMYCIN HCL IN DEXTROSE 1-5 GM/200ML-% IV SOLN
INTRAVENOUS | Status: AC
  Filled 2014-06-26: qty 200

## 2014-06-26 MED ORDER — METOPROLOL TARTRATE 1 MG/ML IV SOLN
INTRAVENOUS | Status: AC
  Administered 2014-06-26: 5 mg
  Filled 2014-06-26: qty 5

## 2014-06-26 MED ORDER — FREE WATER
300.0000 mL | Freq: Four times a day (QID) | Status: DC
  Administered 2014-06-26 – 2014-06-30 (×16): 300 mL

## 2014-06-26 MED ORDER — FUROSEMIDE 10 MG/ML IJ SOLN
60.0000 mg | Freq: Every day | INTRAMUSCULAR | Status: DC
  Administered 2014-06-27 – 2014-06-29 (×3): 60 mg via INTRAVENOUS
  Filled 2014-06-26 (×3): qty 6

## 2014-06-26 MED ORDER — FENTANYL 25 MCG/HR TD PT72
25.0000 ug | MEDICATED_PATCH | TRANSDERMAL | Status: DC
  Administered 2014-06-27: 25 ug via TRANSDERMAL
  Filled 2014-06-26: qty 1

## 2014-06-26 NOTE — Progress Notes (Addendum)
PULMONARY / CRITICAL CARE MEDICINE  Name: Jerome [name] MRN: [id number] DOB:  10/28/1975 ADMISSION DATE: 06/05/2014  CONSULTATION DATE: 06/08/2014  REFERRING MD : TRH  CHIEF COMPLAINT: Respiratory Failure     INITIAL PRESENTATION:  38 yo male with hx of ETOH presented with dyspnea/fatigue from profound anemia (Hb 4.5), and received multiple transfusions of PRBC.  Developed progressive respiratory failure with Rt > Lt pulmonary infiltrates, and PCCM assumed care in ICU.  Studies: 10/9 - There is hepatomegaly and splenomegaly consistent with parenchymal<BR>liver disease  SIGNIFICANT EVENTS:  10/09 admitted; PRBC x3, GI consulted 10/10 PRBC x1 10/11 PRBC x1; new onset PNA on chest x ray 10/12: acute hypoxic respiratory failure overnight requiring BiPAP. Alcohol withdrawal requiring precedex gtt. Intubated for decreasing LOC and increasing 02 needs and worsening PNA on CXR.  10/13 bronchoscopy >> respiratory secretions from RLL, friable airways  - 10/13 Rt thoracentesis > 500 ml fluid: transudate  10/14 gastric residuals  - 10/14 Echo >> EF 55 to 60%, PAS 33 mmHg 10/16 acidosis ?from diprivan >> change to versed gtt  06/13/14:  Significant intermittent agitation.  On 45% fio2.hick tenacious ET tube secretions + .  on fent 150mcg and versed 6mg /h gtt. START LACTULOSE AND XIFAXAN 10/19 fio2 50 % and peep 12 on ARDS protocol 10/20 fio2 40% 8 peep. Slow progress 10/20 self extubation with urgent reintubation and fall off bed to floor 10/22 trached (df) 10/23 agitation remains an issue. 10/25 fever, abx started 10/26 sedated or agitated 10/27 agitation remains an issue. Tolerated t collar 4 hours. 10/28 increase t collar as tolerated. Adjustments in anxiety medications. 10/29 will seek LTAC   Intake/Output Summary (Last 24 hours) at 06/26/14 0953 Last data filed at 06/26/14 0914  Gross per 24 hour  Intake 3383.6 ml  Output   5635 ml  Net -2251.4 ml     SUBJECTIVE/OVERNIGHT/INTERVAL HX Received fentanyl, versed, haldol o/n Did not do ATC 10/29 Good diuresis  PHYSICAL EXAM:  Vital signs in last 24 hours: Temp:  [98.5 F (36.9 C)-102.5 F (39.2 C)] 100.9 F (38.3 C) (10/30 0800) Pulse Rate:  [65-110] 72 (10/30 0800) Resp:  [18-45] 25 (10/30 0800) BP: (136-172)/(69-96) 155/85 mmHg (10/30 0800) SpO2:  [89 %-100 %] 92 % (10/30 0800) FiO2 (%):  [40 %] 40 % (10/30 0850) Weight:  [92.4 kg (203 lb 11.3 oz)-92.9 kg (204 lb 12.9 oz)] 92.4 kg (203 lb 11.3 oz) (10/30 0500) Last BM Date: 06/26/14 VENT SETTINGS: Vent Mode:  [-] PRVC FiO2 (%):  [40 %] 40 % Set Rate:  [16 bmp] 16 bmp Vt Set:  [580 mL] 580 mL PEEP:  [5 cmH20] 5 cmH20 Pressure Support:  [8 cmH20-15 cmH20] 8 cmH20 Plateau Pressure:  [17 cmH20-28 cmH20] 19 cmH20 I/O: Intake/Output     10/29 0701 - 10/30 0700 10/30 0701 - 10/31 0700   I.V. (mL/kg) 1975.8 (21.4) 82.2 (0.9)   NG/GT 1590 80   Total Intake(mL/kg) 3565.8 (38.6) 162.2 (1.8)   Urine (mL/kg/hr) 4735 (2.1) 900 (3.4)   Total Output 4735 900   Net -1169.2 -737.8        Stool Occurrence 1 x 2 x     PHYSICAL EXAMINATION:  General: no distress, rass 1-2,  following commands, slow improvement Neuro: RASS 1- 2, moves ext, intentional, follows commands, communicates HEENT: trach in place, purulent drainage at site Cardiovascular: regular HSR RRR Lungs: slight coarse, decreased in bases Abdomen: Soft, mild  Distension, non-tender, + bowel sounds, TF's going Musculoskeletal: no edema Skin: warm/dry  Lab Results: CBC  Recent Labs Lab 06/24/14 0313 06/25/14 0333 06/26/14 0515  HGB 7.3* 7.5* 7.0*  HCT 24.1* 24.1* 22.8*  WBC 17.8* 19.7* 17.6*  PLT 171 187 176    COAGULATION  Recent Labs Lab 06/23/14 0130 06/24/14 0313  INR 1.51* 1.53*    CHEMISTRY  Recent Labs Lab 06/20/14 0600 06/21/14 0405 06/22/14 0345 06/23/14 0130 06/24/14 0313 06/25/14 0333 06/26/14 0515  NA 143 142 144 147 146  148* 152*  K 4.2 4.2 3.9 3.9 3.9 3.8 3.6*  CL 111 107 109 111 110 112 115*  CO2 23 23 23 24 23 24 24   GLUCOSE 143* 187* 183* 170* 189* 168* 193*  BUN 20 21 21 21 20  25* 28*  CREATININE 0.88 0.87 0.78 0.80 0.87 0.88 0.86  CALCIUM 8.5 8.7 8.0* 8.0* 8.2* 8.4 8.5  MG 1.9 1.7  --   --   --  1.8  --   PHOS 4.4 3.2  --   --   --  3.3  --    Estimated Creatinine Clearance: 135.7 ml/min (by C-G formula based on Cr of 0.86).   LIVER  Recent Labs Lab 06/22/14 0345 06/23/14 0130 06/24/14 0313  AST 85* 91*  --   ALT 26 30  --   ALKPHOS 193* 214*  --   BILITOT 1.7* 1.6*  --   PROT 7.1 7.0  --   ALBUMIN 2.1* 2.1*  --   INR  --  1.51* 1.53*     INFECTIOUS  Recent Labs Lab 06/19/14 1200 06/20/14 0530 06/20/14 0600 06/21/14 0405  LATICACIDVEN 1.6 1.2  --   --   PROCALCITON 0.23  --  0.22 0.28     ENDOCRINE CBG (last 3)   Recent Labs  06/25/14 2036 06/25/14 2352 06/26/14 0337  GLUCAP 179* 188* 175*     IMAGING x48h Dg Chest Port 1 View  06/26/2014   CLINICAL DATA:  Respiratory failure, ET tube position.  EXAM: PORTABLE CHEST - 1 VIEW  COMPARISON:  06/25/2014  FINDINGS: Tracheostomy tube is unchanged. Diffuse bilateral airspace opacities are again noted, slightly improved on the right since prior study. Cardiomegaly. No visible effusions.  IMPRESSION: Diffuse bilateral opacities, likely edema with slight improvement on the right.   Electronically Signed   By: Charlett NoseKevin  Dover M.D.   On: 06/26/2014 07:30   Dg Chest Port 1 View  06/25/2014   CLINICAL DATA:  Respiratory failure, check tracheostomy position  EXAM: PORTABLE CHEST - 1 VIEW  COMPARISON:  06/24/2014  FINDINGS: Tracheostomy tube and feeding catheter are again identified and stable. The cardiac shadow remains enlarged. Increased vascular congestion with pulmonary edema is again identified and appears to have increased somewhat in the interval from the prior exam. No pneumothorax is noted.  IMPRESSION: Increasing  pulmonary edema.   Electronically Signed   By: Alcide CleverMark  Lukens M.D.   On: 06/25/2014 07:35    Intake/Output Summary (Last 24 hours) at 06/26/14 0953 Last data filed at 06/26/14 0914  Gross per 24 hour  Intake 3383.6 ml  Output   5635 ml  Net -2251.4 ml   ASSESSMENT / PLAN:  PULMONARY  OETT 10/12 >>10/20 self extubation, re intubated 10/20>>10/22, Trach #6 (DF)>>  A:   - Acute on chronic hypoxic respiratory failure 2nd to PNA, and b/l pleural effusions (transudate on Rt).   - Prolonged Mechanical Ventilation due to the above + agitation / encephalopathy P:  Wean per protocol > continue efforts at ATC, PSV if not  tolerated Maintain neg balance as tolerated, decrease lasix 10/30 Adjust sedation as needed > this has become largest issue, see neuro section  CARDIOVASCULAR  A:  Hypotension , resolved Hypervolemia P:  - continue to push diuresis - follow QT-c, ECG 10/31  RENAL  Lab Results  Component Value Date   CREATININE 0.86 06/26/2014   CREATININE 0.88 06/25/2014   CREATININE 0.87 06/24/2014    Recent Labs Lab 06/24/14 0313 06/25/14 0333 06/26/14 0515  NA 146 148* 152*    A:  Stage III CKD. Non gap metabolic acidosis, (resolved) Hypernatremia  Mild hypomag(resolved)   P:  Diuresis, decrease dose 10/30 Add free water Chem daily  GASTROINTESTINAL  A:  Severe protein calorie malnutrition. ETOH with cirrhosis? Vs hepatitis, improving  P:  Tube feeds H2 blockade DC xifaxan and lactulose 10/26 Follow LFT  HEMATOLOGIC  Lab Results  Component Value Date   INR 1.53* 06/24/2014   INR 1.51* 06/23/2014   INR 1.52* 06/18/2014    A:  Anemia from chronic GI bleeding >> stable at present. Hepatic coagulopathy P:  Cbc  Follow coags 10/31  INFECTIOUS  A:  Right > left PNA. HCAP vs aspiration.  Flu negative. S/p cefepime 7d ending 06/14/14 Fevers 10/24 (infection vs medication induced?)  10/23 urine>>>neg 10/23 blood>>>neg 10/23 cdiff>>>neg 10/29 bc  x 2>> 10/29uc>> 10/29 sputum>>  P:  ceftaz 10/24>>> 10/27 vanc 10/25>>>10/27  vanco 10/30 >>   10/29 fevers noted. We will pan culture but hold abx for now. He is on Risperdal and we may need to decrease or stop in future. Add vanco on 10/30 for suspicion cellulitis at trach site.     ENDOCRINE  A:  DMII with nephropathy. CBG (last 3)   Recent Labs  06/25/14 2036 06/25/14 2352 06/26/14 0337  GLUCAP 179* 188* 175*   P:  SSI Add low dose lantus  NEUROLOGIC  A:  Acute encephalopathy 2nd to delirium tremens, respiratory failure, meds, critical illness. Possible associted hepatic encephalopathy +  With high Ammonia.  MS actually a bit better on 10/27 after increase in precedex (and addition of Risperdal + valproate, too early to contribute??). He remains frequently agitated   P:  - RASS goal 0 -1  - Continue precedex gtt, max to 3.0, will titrate down once we have better control of his agitation  - Scheduled ativan; once appropriate ativan dose determined (as precedex decreases as well) will likely convert to clonazepam - Continue clonidine - Risperdal 2mg  BID, increased 10/28; may need to d/c depending on QT-c 10/31 - Fentanyl patch in place, decreased to 25 on 10/30 - D/c prn fentanyl pushes - Haldol prn - 10/26 added Depakote  - Foot drop boots   Family, parents,updated at bedside 10/28, 10/29, 10/30 Discussion:   Agitation remains driving issue. Trying to achieve balance of adequate sedation with function. We will seek LTAC and family is OK with that plan. Start vanco 10/30 for possible stomatitis / cellulitis. May need to stop Risperdal if QT-c prolonged, fever continues. Independent CC time 35 minutes   Levy Pupa, MD, PhD 06/26/2014, 9:53 AM  Pulmonary and Critical Care [phone] or if no answer [phone]

## 2014-06-26 NOTE — Progress Notes (Signed)
ANTIBIOTIC CONSULT NOTE - INITIAL  Pharmacy Consult for Vancomycin Indication: Cellulitis  No Known Allergies  Patient Measurements: Height: 5\' 10"  (177.8 cm) Weight: 203 lb 11.3 oz (92.4 kg) IBW/kg (Calculated) : 73  Vital Signs: Temp: 100.9 F (38.3 C) (10/30 0800) Temp Source: Oral (10/30 0800) BP: 155/85 mmHg (10/30 0800) Pulse Rate: 72 (10/30 0800) Intake/Output from previous day: 10/29 0701 - 10/30 0700 In: 3565.8 [I.V.:1975.8; NG/GT:1590] Out: 4735 [Urine:4735] Intake/Output from this shift: Total I/O In: 162.2 [I.V.:82.2; NG/GT:80] Out: 900 [Urine:900]  Labs:  Recent Labs  06/24/14 0313 06/25/14 0333 06/26/14 0515  WBC 17.8* 19.7* 17.6*  HGB 7.3* 7.5* 7.0*  PLT 171 187 176  CREATININE 0.87 0.88 0.86   Estimated Creatinine Clearance: 135.7 ml/min (by C-G formula based on Cr of 0.86). No results found for this basename: VANCOTROUGH, VANCOPEAK, VANCORANDOM, GENTTROUGH, GENTPEAK, GENTRANDOM, TOBRATROUGH, TOBRAPEAK, TOBRARND, AMIKACINPEAK, AMIKACINTROU, AMIKACIN,  in the last 72 hours   Medical History: Past Medical History  Diagnosis Date  . Diabetes mellitus without complication   . Hypertension     Medications:  Scheduled:  . antiseptic oral rinse  7 mL Mouth Rinse QID  . chlorhexidine  15 mL Mouth Rinse BID  . cloNIDine  0.2 mg Oral TID  . feeding supplement (PRO-STAT SUGAR FREE 64)  30 mL Per Tube BID  . [START ON 06/27/2014] fentaNYL  25 mcg Transdermal Q72H  . free water  300 mL Per Tube Q6H  . [START ON 06/27/2014] furosemide  60 mg Intravenous Daily  . insulin aspart  0-9 Units Subcutaneous 6 times per day  . insulin glargine  10 Units Subcutaneous Daily  . LORazepam  1 mg Per Tube Q4H  . potassium chloride  20 mEq Per Tube BID  . ranitidine  150 mg Oral QHS  . risperiDONE  2 mg Oral BID  . sodium chloride  3 mL Intravenous Q12H  . Valproic Acid  250 mg Oral BID   Infusions:  . sodium chloride 10 mL/hr at 06/25/14 0600  .  dexmedetomidine 3 mcg/kg/hr (06/26/14 [id number])  . feeding supplement (VITAL AF 1.2 CAL) 1,000 mL (06/25/14 1023)   PRN: acetaminophen (TYLENOL) oral liquid 160 mg/5 mL, albuterol, haloperidol lactate, midazolam  Assessment: 38 yo M admitted 10/9 with progressive respiratory failure in setting of EtOH withdrawal and hepatic encephalopathy. He was treated for HCAP with empiric antibiotics - completed course on 10/18. Febrile to 102.6 on 10/24 - empiric antibiotic therapy was resumed with ceftazidime 2 grams IV q8h, but remained febrile on 10/25 and pharmacy was consulted to resume vancomycin dosing. All antibiotics dc'd 10/27.  Pharmacy reconsulted today, 10/30, to resume vancomycin dosing for possible cellulitis at trach site.  10/11 >> levofloxacin >> 10/12 10/12 >> vanc >> 10/16 10/12 >> cefepime >> 10/18 10/17 >> rifaximin (HE) >> 10/26 10/24 >> ceftazidime >> 10/27 10/25 >> vancomycin >> 10/27, restart 10/30  Tmax: 101.2 WBC: remain elevated 17.6 Renal: SCr 0.86, CrCl > 100 ml/min   10/9 MRSA PCR: positive 10/12 influenza panel: neg 10/13 BAL cx: NGF 10/13 thoracentesis pleural cx: NGF 10/14 trach aspirate: oral flora 10/23 CDiff: neg 10/23 urine: NGF 10/23 blood x 2: NGF 10/29 trach asp: rare GPC pairs/clusters 10/29 urine: sent 10/29 blood x 2: ngtd  Drug level / dose changes info: 10/13 1700 VT: <5 on 1g q8h, incr to 1250mg  q8h 10/15 1700 VT: 8.6 on 1250mg  q8h increase to 1500mg  Q8H 10/27 0130 VT =12.6 mg/L On 1500mg  q8h (VT before 6th dose,  includes 2g loading dose), increase to 1750 q8   Goal of Therapy:  Vancomycin trough level 10-15 mcg/ml  Plan:   Vancomycin 1500mg  IV q8h - lower dose than previous since goal vancomycin troughs are lower for cellulitis Check trough at steady state Follow up renal function & cultures  Loralee PacasErin Ling Flesch, PharmD, BCPS Pager: [phone] 06/26/2014,10:22 AM

## 2014-06-26 NOTE — Progress Notes (Addendum)
Upon on entering patients room, RN noticed patient HR to be in 170's. Patient agitated, diaphoretic, and pale in complexion. Patient put back on ventilator at full support and ECG completed. Dr. Delton CoombesByrum and Brett CanalesSteve Minor NP paged. Will continue to monitor.

## 2014-06-27 ENCOUNTER — Inpatient Hospital Stay (HOSPITAL_COMMUNITY): Payer: BC Managed Care – PPO

## 2014-06-27 LAB — GLUCOSE, CAPILLARY
GLUCOSE-CAPILLARY: 159 mg/dL — AB (ref 70–99)
GLUCOSE-CAPILLARY: 114 mg/dL — AB (ref 70–99)
Glucose-Capillary: 132 mg/dL — ABNORMAL HIGH (ref 70–99)
Glucose-Capillary: 171 mg/dL — ABNORMAL HIGH (ref 70–99)
Glucose-Capillary: 187 mg/dL — ABNORMAL HIGH (ref 70–99)

## 2014-06-27 LAB — MAGNESIUM: Magnesium: 1.7 mg/dL (ref 1.5–2.5)

## 2014-06-27 LAB — CBC
HCT: 23.6 % — ABNORMAL LOW (ref 39.0–52.0)
HEMOGLOBIN: 7.1 g/dL — AB (ref 13.0–17.0)
MCH: 25.5 pg — AB (ref 26.0–34.0)
MCHC: 30.1 g/dL (ref 30.0–36.0)
MCV: 84.9 fL (ref 78.0–100.0)
PLATELETS: 227 10*3/uL (ref 150–400)
RBC: 2.78 MIL/uL — AB (ref 4.22–5.81)
RDW: 24.4 % — ABNORMAL HIGH (ref 11.5–15.5)
WBC: 16.2 10*3/uL — ABNORMAL HIGH (ref 4.0–10.5)

## 2014-06-27 LAB — BASIC METABOLIC PANEL
Anion gap: 12 (ref 5–15)
BUN: 21 mg/dL (ref 6–23)
CALCIUM: 8.4 mg/dL (ref 8.4–10.5)
CO2: 21 meq/L (ref 19–32)
Chloride: 114 mEq/L — ABNORMAL HIGH (ref 96–112)
Creatinine, Ser: 0.65 mg/dL (ref 0.50–1.35)
GFR calc Af Amer: >90 mL/min (ref >90)
GFR calc non Af Amer: >90 mL/min (ref >90)
GLUCOSE: 155 mg/dL — AB (ref 70–99)
POTASSIUM: 4.3 meq/L (ref 3.7–5.3)
Sodium: 147 mEq/L (ref 137–147)

## 2014-06-27 LAB — URINE CULTURE
CULTURE: NO GROWTH
Colony Count: NO GROWTH
Special Requests: NORMAL

## 2014-06-27 LAB — PHOSPHORUS: PHOSPHORUS: 3.7 mg/dL (ref 2.3–4.6)

## 2014-06-27 MED ORDER — FENTANYL CITRATE 0.05 MG/ML IJ SOLN
25.0000 ug | INTRAMUSCULAR | Status: DC | PRN
  Administered 2014-06-27 – 2014-07-01 (×26): 100 ug via INTRAVENOUS
  Administered 2014-07-01 – 2014-07-02 (×2): 50 ug via INTRAVENOUS
  Filled 2014-06-27 (×28): qty 2

## 2014-06-27 MED ORDER — LIP MEDEX EX OINT
TOPICAL_OINTMENT | CUTANEOUS | Status: DC | PRN
  Administered 2014-06-29 – 2014-07-01 (×2): via TOPICAL
  Filled 2014-06-27: qty 7

## 2014-06-27 MED ORDER — FENTANYL CITRATE 0.05 MG/ML IJ SOLN
100.0000 ug | Freq: Once | INTRAMUSCULAR | Status: AC
  Administered 2014-06-27: 100 ug via INTRAVENOUS

## 2014-06-27 MED ORDER — FENTANYL CITRATE 0.05 MG/ML IJ SOLN
INTRAMUSCULAR | Status: AC
  Filled 2014-06-27: qty 2

## 2014-06-27 NOTE — Progress Notes (Signed)
PULMONARY / CRITICAL CARE MEDICINE  Name: Jerome [name] MRN: [id number] DOB:  10/28/1975 ADMISSION DATE: 06/05/2014  CONSULTATION DATE: 06/08/2014  REFERRING MD : TRH  CHIEF COMPLAINT: Respiratory Failure     INITIAL PRESENTATION:  38 yo male with hx of ETOH presented with dyspnea/fatigue from profound anemia (Hb 4.5), and received multiple transfusions of PRBC.  Developed progressive respiratory failure with Rt > Lt pulmonary infiltrates, and PCCM assumed care in ICU.  Studies: 10/9 - There is hepatomegaly and splenomegaly consistent with parenchymal<BR>liver disease  SIGNIFICANT EVENTS:  10/09 admitted; PRBC x3, GI consulted 10/10 PRBC x1 10/11 PRBC x1; new onset PNA on chest x ray 10/12: acute hypoxic respiratory failure overnight requiring BiPAP. Alcohol withdrawal requiring precedex gtt. Intubated for decreasing LOC and increasing 02 needs and worsening PNA on CXR.  10/13 bronchoscopy >> respiratory secretions from RLL, friable airways  - 10/13 Rt thoracentesis > 500 ml fluid: transudate  10/14 gastric residuals  - 10/14 Echo >> EF 55 to 60%, PAS 33 mmHg 10/16 acidosis ?from diprivan >> change to versed gtt  06/13/14:  Significant intermittent agitation.  On 45% fio2.hick tenacious ET tube secretions + .  on fent 150mcg and versed 6mg /h gtt. START LACTULOSE AND XIFAXAN 10/19 fio2 50 % and peep 12 on ARDS protocol 10/20 fio2 40% 8 peep. Slow progress 10/20 self extubation with urgent reintubation and fall off bed to floor 10/22 trached (df) 10/23 agitation remains an issue. 10/25 fever, abx started 10/26 sedated or agitated 10/27 agitation remains an issue. Tolerated t collar 4 hours. 10/28 increase t collar as tolerated. Adjustments in anxiety medications. 10/29 will seek LTAC 10/30 ATC one hour   Intake/Output Summary (Last 24 hours) at 06/27/14 0753 Last data filed at 06/27/14 0600  Gross per 24 hour  Intake 4311.6 ml  Output   2650 ml  Net 1661.6 ml     SUBJECTIVE/OVERNIGHT/INTERVAL HX Cough, bloody trach secretions this AM  PHYSICAL EXAM:  Vital signs in last 24 hours: Temp:  [99.1 F (37.3 C)-100.9 F (38.3 C)] 99.1 F (37.3 C) (10/31 0000) Pulse Rate:  [57-185] 57 (10/31 0600) Resp:  [19-48] 19 (10/31 0600) BP: (106-183)/(57-90) 106/58 mmHg (10/31 0600) SpO2:  [87 %-97 %] 90 % (10/31 0600) FiO2 (%):  [40 %-60 %] 40 % (10/31 0319) Weight:  [90.9 kg (200 lb 6.4 oz)] 90.9 kg (200 lb 6.4 oz) (10/31 0415) Last BM Date: 06/26/14 VENT SETTINGS: Vent Mode:  [-] PRVC FiO2 (%):  [40 %-60 %] 40 % Set Rate:  [16 bmp] 16 bmp Vt Set:  [580 mL] 580 mL PEEP:  [5 cmH20] 5 cmH20 Pressure Support:  [8 cmH20] 8 cmH20 Plateau Pressure:  [18 cmH20-19 cmH20] 19 cmH20 I/O: Intake/Output     10/30 0701 - 10/31 0700 10/31 0701 - 11/01 0700   I.V. (mL/kg) 1641.6 (18.1)    NG/GT 1170    IV Piggyback 1500    Total Intake(mL/kg) 4311.6 (47.4)    Urine (mL/kg/hr) 2650 (1.2)    Total Output 2650     Net +1661.6          Stool Occurrence 10 x      PHYSICAL EXAMINATION:   Gen: mild distress on vent HEENT: NCAT, Trache with frothy bloody secretions PULM: CTA B CV: RRR, no mgr AB: distended, BS+, nontender Ext: edema Neuro: follows commands, awake  Lab Results: CBC  Recent Labs Lab 06/25/14 0333 06/26/14 0515 06/27/14 0540  HGB 7.5* 7.0* 7.1*  HCT 24.1* 22.8* 23.6*  WBC  19.7* 17.6* 16.2*  PLT 187 176 227    COAGULATION  Recent Labs Lab 06/23/14 0130 06/24/14 0313  INR 1.51* 1.53*    CHEMISTRY  Recent Labs Lab 06/21/14 0405  06/23/14 0130 06/24/14 0313 06/25/14 0333 06/26/14 0515 06/27/14 0540  NA 142  < > 147 146 148* 152* 147  K 4.2  < > 3.9 3.9 3.8 3.6* 4.3  CL 107  < > 111 110 112 115* 114*  CO2 23  < > 24 23 24 24 21   GLUCOSE 187*  < > 170* 189* 168* 193* 155*  BUN 21  < > 21 20 25* 28* 21  CREATININE 0.87  < > 0.80 0.87 0.88 0.86 0.65  CALCIUM 8.7  < > 8.0* 8.2* 8.4 8.5 8.4  MG 1.7  --   --   --   1.8  --  1.7  PHOS 3.2  --   --   --  3.3  --  3.7  < > = values in this interval not displayed. Estimated Creatinine Clearance: 144.8 ml/min (by C-G formula based on Cr of 0.65).   LIVER  Recent Labs Lab 06/22/14 0345 06/23/14 0130 06/24/14 0313  AST 85* 91*  --   ALT 26 30  --   ALKPHOS 193* 214*  --   BILITOT 1.7* 1.6*  --   PROT 7.1 7.0  --   ALBUMIN 2.1* 2.1*  --   INR  --  1.51* 1.53*     INFECTIOUS  Recent Labs Lab 06/21/14 0405  PROCALCITON 0.28     ENDOCRINE CBG (last 3)   Recent Labs  06/26/14 1948 06/27/14 0013 06/27/14 0407  GLUCAP 139* 187* 132*     IMAGING x48h Dg Chest Port 1 View  06/26/2014   CLINICAL DATA:  Respiratory failure, ET tube position.  EXAM: PORTABLE CHEST - 1 VIEW  COMPARISON:  06/25/2014  FINDINGS: Tracheostomy tube is unchanged. Diffuse bilateral airspace opacities are again noted, slightly improved on the right since prior study. Cardiomegaly. No visible effusions.  IMPRESSION: Diffuse bilateral opacities, likely edema with slight improvement on the right.   Electronically Signed   By: Charlett Nose M.D.   On: 06/26/2014 07:30    Intake/Output Summary (Last 24 hours) at 06/27/14 0753 Last data filed at 06/27/14 0600  Gross per 24 hour  Intake 4311.6 ml  Output   2650 ml  Net 1661.6 ml   ASSESSMENT / PLAN:  PULMONARY  OETT 10/12 >>10/20 self extubation, re intubated 10/20>>10/22, Trach #6 (DF)>>  A:  Acute on chronic hypoxic respiratory failure 2nd to PNA, and b/l pleural effusions (transudate on Rt).  Prolonged Mechanical Ventilation due to the above + agitation / encephalopathy Bloody trach secretions 10/31, mild likely suction trauma P:  Wean per protocol > continue efforts at ATC, PSV if not tolerated Continue daily lasix Continue to titrate sedation to RASS 0  CARDIOVASCULAR  A:  Hypervolemia> improving 10/31 QTc 450 P:  Goal net even to slightly negative Monitor QTc  RENAL  Lab Results  Component  Value Date   CREATININE 0.65 06/27/2014   CREATININE 0.86 06/26/2014   CREATININE 0.88 06/25/2014    Recent Labs Lab 06/25/14 0333 06/26/14 0515 06/27/14 0540  NA 148* 152* 147    A:  Stage III CKD Non gap metabolic acidosis, (resolved) Hypernatremia improved   P:  Continue daily lasix contine free water Chem daily  GASTROINTESTINAL  A:  Severe protein calorie malnutrition. ETOH with cirrhosis? Vs hepatitis,  improving  P:  Continue Tube feeds H2 blockade Follow LFT  HEMATOLOGIC  Lab Results  Component Value Date   INR 1.53* 06/24/2014   INR 1.51* 06/23/2014   INR 1.52* 06/18/2014    A:  Anemia from chronic GI bleeding >> stable at present. Hepatic coagulopathy P:  Cbc  Follow coags 10/31  INFECTIOUS  A:  Right > left PNA. HCAP vs aspiration.  Flu negative. S/p cefepime 7d ending 06/14/14 Fevers 10/24 (infection vs medication induced?) Trach site cellulitis  10/23 urine>>>neg 10/23 blood>>>neg 10/23 cdiff>>>neg 10/29 bc x 2>> 10/29uc>> neg 10/29 sputum>>rare gpc cluster 10/30 c.diff > neg  P:  ceftaz 10/24>>> 10/27 vanc 10/25>>>10/27  vanco 10/30 >>   Continue vancomycin for trach site cellulitis Wound care for trache site    ENDOCRINE  A:  DMII with nephropathy. CBG (last 3)   Recent Labs  06/26/14 1948 06/27/14 0013 06/27/14 0407  GLUCAP 139* 187* 132*   P:  SSI Add low dose lantus  NEUROLOGIC  A:  Acute encephalopathy 2nd to delirium tremens, respiratory failure, meds, critical illness.  This has been a major challenge during this hospitalization, continue precedex, ativan, fentanyl, valproate  P:  - RASS goal 0 -1  - Continue precedex gtt, max to 3.0, will titrate down once we have better control of his agitation  - continue scheduled ativan; consider change to clonazepam - Continue clonidine - Risperdal 2mg  BID, continue to monitor QT-c  - Continue fentanyl patch, may need to increase again - restart prn fentanyl  pushes - Haldol prn - 10/26 added Depakote  - Foot drop boots   Family, parents,updated at bedside 10/28, 10/29, 10/30 Discussion:   Agitated delirium still a big issue.  Add back fentanyl, watch bloody secretions, treat cellulitis, get wound care involved with trach site   CC time by me 35 minutes   Heber CarolinaBrent McQuaid, MD [facility] Pager: [phone] Cell: [phone] If no response, call [phone]

## 2014-06-27 NOTE — Progress Notes (Signed)
Placed alert. Placed on 40% atc. After 10 minutes rate went into the 40s placed back on vent.

## 2014-06-28 ENCOUNTER — Inpatient Hospital Stay (HOSPITAL_COMMUNITY): Payer: BC Managed Care – PPO

## 2014-06-28 DIAGNOSIS — J9621 Acute and chronic respiratory failure with hypoxia: Secondary | ICD-10-CM

## 2014-06-28 DIAGNOSIS — E876 Hypokalemia: Secondary | ICD-10-CM

## 2014-06-28 LAB — GLUCOSE, CAPILLARY
GLUCOSE-CAPILLARY: 167 mg/dL — AB (ref 70–99)
GLUCOSE-CAPILLARY: 165 mg/dL — AB (ref 70–99)
Glucose-Capillary: 134 mg/dL — ABNORMAL HIGH (ref 70–99)
Glucose-Capillary: 137 mg/dL — ABNORMAL HIGH (ref 70–99)
Glucose-Capillary: 158 mg/dL — ABNORMAL HIGH (ref 70–99)
Glucose-Capillary: 150 mg/dL — ABNORMAL HIGH (ref 70–99)
Glucose-Capillary: 161 mg/dL — ABNORMAL HIGH (ref 70–99)

## 2014-06-28 LAB — BASIC METABOLIC PANEL
Anion gap: 11 (ref 5–15)
BUN: 19 mg/dL (ref 6–23)
CALCIUM: 8.3 mg/dL — AB (ref 8.4–10.5)
CO2: 22 mEq/L (ref 19–32)
Chloride: 116 mEq/L — ABNORMAL HIGH (ref 96–112)
Creatinine, Ser: 0.69 mg/dL (ref 0.50–1.35)
GFR calc Af Amer: >90 mL/min (ref >90)
GFR calc non Af Amer: >90 mL/min (ref >90)
GLUCOSE: 149 mg/dL — AB (ref 70–99)
Potassium: 3.4 mEq/L — ABNORMAL LOW (ref 3.7–5.3)
SODIUM: 149 meq/L — AB (ref 137–147)

## 2014-06-28 LAB — CBC WITH DIFFERENTIAL/PLATELET
Basophils Absolute: 0.1 10*3/uL (ref 0.0–0.1)
Basophils Relative: 1 % (ref 0–1)
EOS ABS: 0.8 10*3/uL — AB (ref 0.0–0.7)
Eosinophils Relative: 6 % — ABNORMAL HIGH (ref 0–5)
HCT: 24.4 % — ABNORMAL LOW (ref 39.0–52.0)
Hemoglobin: 7.4 g/dL — ABNORMAL LOW (ref 13.0–17.0)
LYMPHS ABS: 1.6 10*3/uL (ref 0.7–4.0)
Lymphocytes Relative: 11 % — ABNORMAL LOW (ref 12–46)
MCH: 25.6 pg — AB (ref 26.0–34.0)
MCHC: 30.3 g/dL (ref 30.0–36.0)
MCV: 84.4 fL (ref 78.0–100.0)
MONOS PCT: 7 % (ref 3–12)
Monocytes Absolute: 1 10*3/uL (ref 0.1–1.0)
Neutro Abs: 10.4 10*3/uL — ABNORMAL HIGH (ref 1.7–7.7)
Neutrophils Relative %: 75 % (ref 43–77)
PLATELETS: 211 10*3/uL (ref 150–400)
RBC: 2.89 MIL/uL — AB (ref 4.22–5.81)
RDW: 23.8 % — ABNORMAL HIGH (ref 11.5–15.5)
WBC: 13.8 10*3/uL — ABNORMAL HIGH (ref 4.0–10.5)

## 2014-06-28 LAB — VANCOMYCIN, TROUGH: Vancomycin Tr: 16.7 ug/mL (ref 10.0–20.0)

## 2014-06-28 LAB — CULTURE, RESPIRATORY W GRAM STAIN: Special Requests: NORMAL

## 2014-06-28 LAB — AMMONIA: AMMONIA: 83 umol/L — AB (ref 11–60)

## 2014-06-28 MED ORDER — FUROSEMIDE 10 MG/ML IJ SOLN
40.0000 mg | Freq: Once | INTRAMUSCULAR | Status: AC
  Administered 2014-06-28: 40 mg via INTRAVENOUS
  Filled 2014-06-28: qty 4

## 2014-06-28 MED ORDER — LORAZEPAM 1 MG PO TABS
1.0000 mg | ORAL_TABLET | ORAL | Status: DC
  Administered 2014-06-28 – 2014-06-29 (×8): 1 mg
  Filled 2014-06-28 (×9): qty 1

## 2014-06-28 MED ORDER — POTASSIUM CHLORIDE 20 MEQ/15ML (10%) PO LIQD
40.0000 meq | Freq: Once | ORAL | Status: AC
  Administered 2014-06-28: 40 meq
  Filled 2014-06-28: qty 30

## 2014-06-28 MED ORDER — POTASSIUM CHLORIDE 20 MEQ/15ML (10%) PO SOLN: 20.0000 meq | Freq: Two times a day (BID) | ORAL | Status: DC

## 2014-06-28 MED ORDER — VANCOMYCIN HCL 10 G IV SOLR
1250.0000 mg | Freq: Three times a day (TID) | INTRAVENOUS | Status: AC
  Administered 2014-06-28 – 2014-07-02 (×12): 1250 mg via INTRAVENOUS
  Filled 2014-06-28 (×13): qty 1250

## 2014-06-28 MED ORDER — FENTANYL 50 MCG/HR TD PT72
50.0000 ug | MEDICATED_PATCH | TRANSDERMAL | Status: DC
  Administered 2014-06-28 – 2014-07-07 (×4): 50 ug via TRANSDERMAL
  Filled 2014-06-28 (×5): qty 1

## 2014-06-28 MED ORDER — FENTANYL 50 MCG/HR TD PT72: 50.0000 ug | MEDICATED_PATCH | TRANSDERMAL | Status: DC

## 2014-06-28 MED ORDER — POTASSIUM CHLORIDE 20 MEQ/15ML (10%) PO SOLN
20.0000 meq | Freq: Two times a day (BID) | ORAL | Status: DC
  Administered 2014-06-28 – 2014-06-29 (×2): 20 meq
  Filled 2014-06-28 (×3): qty 15

## 2014-06-28 NOTE — Progress Notes (Signed)
PULMONARY / CRITICAL CARE MEDICINE  Name: Jerome [name] MRN: [id number] DOB:  10/28/1975 ADMISSION DATE: 06/05/2014  CONSULTATION DATE: 06/08/2014  REFERRING MD : TRH  CHIEF COMPLAINT: Respiratory Failure     INITIAL PRESENTATION:  38 yo male with hx of ETOH presented with dyspnea/fatigue from profound anemia (Hb 4.5), and received multiple transfusions of PRBC.  Developed progressive respiratory failure with Rt > Lt pulmonary infiltrates, and PCCM assumed care in ICU.  Studies: 10/9 - There is hepatomegaly and splenomegaly consistent with parenchymal<BR>liver disease  SIGNIFICANT EVENTS:  10/09 admitted; PRBC x3, GI consulted 10/10 PRBC x1 10/11 PRBC x1; new onset PNA on chest x ray 10/12: acute hypoxic respiratory failure overnight requiring BiPAP. Alcohol withdrawal requiring precedex gtt. Intubated for decreasing LOC and increasing 02 needs and worsening PNA on CXR.  10/13 bronchoscopy >> respiratory secretions from RLL, friable airways  - 10/13 Rt thoracentesis > 500 ml fluid: transudate  10/14 gastric residuals  - 10/14 Echo >> EF 55 to 60%, PAS 33 mmHg 10/16 acidosis ?from diprivan >> change to versed gtt  06/13/14:  Significant intermittent agitation.  On 45% fio2.hick tenacious ET tube secretions + .  on fent 150mcg and versed 6mg /h gtt. START LACTULOSE AND XIFAXAN 10/19 fio2 50 % and peep 12 on ARDS protocol 10/20 fio2 40% 8 peep. Slow progress 10/20 self extubation with urgent reintubation and fall off bed to floor 10/22 trached (df) 10/23 agitation remains an issue. 10/25 fever, abx started 10/26 sedated or agitated 10/27 agitation remains an issue. Tolerated t collar 4 hours. 10/28 increase t collar as tolerated. Adjustments in anxiety medications. 10/29 will seek LTAC 10/30 ATC one hour 10/31 ATC only 10 minutes, but PSV 15/5 for 12 hours   Intake/Output Summary (Last 24 hours) at 06/28/14 0814 Last data filed at 06/28/14 0520  Gross per 24 hour  Intake  4140.6 ml  Output   2500 ml  Net 1640.6 ml    SUBJECTIVE/OVERNIGHT/INTERVAL HX ATC only 10 minutes, but PSV 15/5 for 12 hours, more agitated  PHYSICAL EXAM:  Vital signs in last 24 hours: Temp:  [97.7 F (36.5 C)-99.5 F (37.5 C)] 98.1 F (36.7 C) (11/01 0300) Pulse Rate:  [65-105] 75 (11/01 0500) Resp:  [12-38] 24 (11/01 0500) BP: (118-150)/(56-86) 140/69 mmHg (11/01 0500) SpO2:  [88 %-100 %] 100 % (11/01 0500) FiO2 (%):  [40 %] 40 % (11/01 0405) Weight:  [93.2 kg (205 lb 7.5 oz)] 93.2 kg (205 lb 7.5 oz) (11/01 0500) Last BM Date: 06/27/14 VENT SETTINGS: Vent Mode:  [-] PRVC FiO2 (%):  [40 %] 40 % Set Rate:  [16 bmp] 16 bmp Vt Set:  [580 mL] 580 mL PEEP:  [5 cmH20] 5 cmH20 Pressure Support:  [17 cmH20-20 cmH20] 20 cmH20 Plateau Pressure:  [15 cmH20-24 cmH20] 15 cmH20 I/O: Intake/Output      10/31 0701 - 11/01 0700 11/01 0701 - 11/02 0700   I.V. (mL/kg) 1000.6 (10.7)    NG/GT 1700    IV Piggyback 1530    Total Intake(mL/kg) 4230.6 (45.4)    Urine (mL/kg/hr) 2500 (1.1)    Total Output 2500     Net +1730.6          Stool Occurrence 8 x      PHYSICAL EXAMINATION:   Gen: agitated delirium but breathing comfortably HEENT: NCAT, Trache with frothy bloody secretions PULM: Rhonchi R > L CV: RRR, no mgr AB: distended, BS+, nontender Ext: edema Neuro: follows commands, nods head to questions  Lab Results:  CBC  Recent Labs Lab 06/26/14 0515 06/27/14 0540 06/28/14 0350  HGB 7.0* 7.1* 7.4*  HCT 22.8* 23.6* 24.4*  WBC 17.6* 16.2* 13.8*  PLT 176 227 211    COAGULATION  Recent Labs Lab 06/23/14 0130 06/24/14 0313  INR 1.51* 1.53*    CHEMISTRY  Recent Labs Lab 06/24/14 0313 06/25/14 0333 06/26/14 0515 06/27/14 0540 06/28/14 0350  NA 146 148* 152* 147 149*  K 3.9 3.8 3.6* 4.3 3.4*  CL 110 112 115* 114* 116*  CO2 23 24 24 21 22   GLUCOSE 189* 168* 193* 155* 149*  BUN 20 25* 28* 21 19  CREATININE 0.87 0.88 0.86 0.65 0.69  CALCIUM 8.2* 8.4 8.5  8.4 8.3*  MG  --  1.8  --  1.7  --   PHOS  --  3.3  --  3.7  --    Estimated Creatinine Clearance: 146.4 mL/min (by C-G formula based on Cr of 0.69).   LIVER  Recent Labs Lab 06/22/14 0345 06/23/14 0130 06/24/14 0313  AST 85* 91*  --   ALT 26 30  --   ALKPHOS 193* 214*  --   BILITOT 1.7* 1.6*  --   PROT 7.1 7.0  --   ALBUMIN 2.1* 2.1*  --   INR  --  1.51* 1.53*     INFECTIOUS No results for input(s): LATICACIDVEN, PROCALCITON in the last 168 hours.   ENDOCRINE CBG (last 3)   Recent Labs  06/27/14 1959 06/27/14 2327 06/28/14 0455  GLUCAP 134* 137* 167*     IMAGING x48h Dg Abd 1 View  06/27/2014   CLINICAL DATA:  Feeding tube placement  EXAM: ABDOMEN - 1 VIEW  COMPARISON:  None.  FINDINGS: Feeding tube tip is in the distal duodenum. Generalized bowel distention.  IMPRESSION: Feeding tube tip in the distal duodenum.   Electronically Signed   By: Maryclare BeanArt  Hoss M.D.   On: 06/27/2014 08:55   Dg Chest Port 1 View  06/27/2014   CLINICAL DATA:  Hypertension, acute respectively failure  EXAM: PORTABLE CHEST - 1 VIEW  COMPARISON:  Radiograph 06/26/2014  FINDINGS: The tracheostomy tube unchanged. Feeding tube extends to the stomach. Stable enlarged cardiac silhouette. There is perihilar airspace disease which is slightly improved compared to prior. No pneumothorax.  IMPRESSION: 1. Stable support apparatus. 2. Slight improvement in perihilar airspace disease.   Electronically Signed   By: Genevive BiStewart  Edmunds M.D.   On: 06/27/2014 08:56    Intake/Output Summary (Last 24 hours) at 06/28/14 0814 Last data filed at 06/28/14 0520  Gross per 24 hour  Intake 4140.6 ml  Output   2500 ml  Net 1640.6 ml   ASSESSMENT / PLAN:  PULMONARY  OETT 10/12 >>10/20 self extubation, re intubated 10/20>>10/22, Trach #6 (DF)>>  A:  Acute on chronic hypoxic respiratory failure 2nd to PNA, and b/l pleural effusions (transudate on Rt).  Prolonged Mechanical Ventilation due to the above + agitation /  encephalopathy > 11/1 likely pulm edema P:  Wean per protocol > continue efforts at ATC, PSV if not tolerated Lasix x2 doses today Continue to titrate sedation to RASS 0  CARDIOVASCULAR  A:  Hypervolemia> improving 10/31 QTc 450 P:  Goal net even to slightly negative (two lasix doses today) Monitor QTc  RENAL  Lab Results  Component Value Date   CREATININE 0.69 06/28/2014   CREATININE 0.65 06/27/2014   CREATININE 0.86 06/26/2014    Recent Labs Lab 06/26/14 0515 06/27/14 0540 06/28/14 0350  NA 152*  147 149*    A:  Stage III CKD Non gap metabolic acidosis, (resolved) Hypernatremia essentially unchanged   P:  Continue daily lasix continue free water Chem daily  GASTROINTESTINAL  A:  Severe protein calorie malnutrition. ETOH with cirrhosis? 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 Hepatic coagulopathy P:  Cbc intermittently Follow coags 10/31  INFECTIOUS  A:  Right > left PNA. HCAP vs aspiration.  Flu negative. S/p cefepime 7d ending 06/14/14 Fevers 10/24 (infection vs medication induced?) Trach site cellulitis  10/23 urine>>>neg 10/23 blood>>>neg 10/23 cdiff>>>neg 10/29 bc x 2>> 10/29uc>> neg 10/29 sputum>>rare gpc cluster 10/30 c.diff > neg  P:  ceftaz 10/24>>> 10/27 vanc 10/25>>>10/27  vanco 10/30 >> plan 7 days  Continue vancomycin for trach site cellulitis Wound care for trache site    ENDOCRINE  A:  DMII with nephropathy. CBG (last 3)   Recent Labs  06/27/14 1959 06/27/14 2327 06/28/14 0455  GLUCAP 134* 137* 167*   P:  SSI Continue low dose lantus  NEUROLOGIC  A:  Acute encephalopathy 2nd to delirium tremens, respiratory failure, meds, critical illness.  This has been a major challenge during this  hospitalization, continue precedex, ativan, fentanyl, valproate >11/1 after dropping fentanyl patch dose he is more awake but pulling at tubes and lines more; QTc 500 today P:  - RASS goal 0 -1  - Continue precedex gtt, max to 3.0, will titrate down once we have better control of his agitation  - continue scheduled ativan; consider change to clonazepam - Continue clonidine - Risperdal 2mg  BID, continue to monitor QT-c daily - Continue fentanyl patch, increase to today - restart prn fentanyl pushes - stop Haldol today due to QTc 500 - 10/26 added Depakote  - Foot drop boots - check ammonia  Family, parents,updated at bedside 10/31 and 11/1 at length Discussion:   Agitated delirium still a big issue. Increase fentanyl patch, check ammonia, no haldol given QTc, treat cellulitis, get wound care involved with trach site   CC time by me 45 minutes   Heber , MD [place] [facility] Pager: [phone] Cell: [phone] If no response, call [phone]

## 2014-06-28 NOTE — Progress Notes (Signed)
ANTIBIOTIC CONSULT NOTE - FOLLOW UP  Pharmacy Consult for Vancomycin Indication: Cellulitis  No Known Allergies  Patient Measurements: Height: 5\' 10"  (177.8 cm) Weight: 205 lb 7.5 oz (93.2 kg) IBW/kg (Calculated) : 73  Vital Signs: Temp: 99.3 F (37.4 C) (11/01 0800) Temp Source: Oral (11/01 0800) BP: 137/64 mmHg (11/01 1200) Pulse Rate: 74 (11/01 1200) Intake/Output from previous day: 10/31 0701 - 11/01 0700 In: 4485 [I.V.:1155; NG/GT:1800; IV Piggyback:1530] Out: 2500 [Urine:2500] Intake/Output from this shift: Total I/O In: 653.8 [I.V.:328.8; NG/GT:325] Out: 2250 [Urine:2250]  Labs:  Recent Labs  06/26/14 0515 06/27/14 0540 06/28/14 0350  WBC 17.6* 16.2* 13.8*  HGB 7.0* 7.1* 7.4*  PLT 176 227 211  CREATININE 0.86 0.65 0.69   Estimated Creatinine Clearance: 146.4 mL/min (by C-G formula based on Cr of 0.69).  Recent Labs  06/28/14 1126  VANCOTROUGH 16.7     Microbiology: Recent Results (from the past 720 hour(s))  MRSA PCR Screening     Status: Abnormal   Collection Time: 06/05/14  6:45 PM  Result Value Ref Range Status   MRSA by PCR POSITIVE (A) NEGATIVE Final    Comment:        The GeneXpert MRSA Assay (FDA approved for NASAL specimens only), is one component of a comprehensive MRSA colonization surveillance program. It is not intended to diagnose MRSA infection nor to guide or monitor treatment for MRSA infections. RESULT CALLED TO, READ BACK BY AND VERIFIED WITH: C.AYERS,RN AT 2213 ON 06/05/14 BY SHEAW  Culture, expectorated sputum-assessment     Status: None   Collection Time: 06/07/14 10:01 PM  Result Value Ref Range Status   Specimen Description SPUTUM  Final   Special Requests NONE  Final   Sputum evaluation   Final    MICROSCOPIC FINDINGS SUGGEST THAT THIS SPECIMEN IS NOT REPRESENTATIVE OF LOWER RESPIRATORY SECRETIONS. PLEASE RECOLLECT.   Report Status 06/07/2014 FINAL  Final  Culture, bal-quantitative     Status: None   Collection  Time: 06/09/14 10:25 AM  Result Value Ref Range Status   Specimen Description BRONCHIAL ALVEOLAR LAVAGE  Final   Special Requests NONE  Final   Gram Stain   Final    RARE WBC PRESENT, PREDOMINANTLY MONONUCLEAR NO SQUAMOUS EPITHELIAL CELLS SEEN NO ORGANISMS SEEN [facility]formed [facility]   Colony Count NO GROWTH [facility]  Final   Culture   Final    NO GROWTH 2 DAYS Performed at [facility]   Report Status 06/11/2014 FINAL  Final  Body fluid culture     Status: None   Collection Time: 06/09/14 10:34 AM  Result Value Ref Range Status   Specimen Description THORACENTESIS PLEURAL  Final   Special Requests NONE  Final   Gram Stain   Final    NO WBC SEEN NO ORGANISMS SEEN [facility]formed at [facility]   Culture   Final    NO GROWTH 3 DAYS Performed at [facility]   Report Status 06/12/2014 FINAL  Final  Culture, respiratory (NON-Expectorated)     Status: None   Collection Time: 06/10/14  8:30 AM  Result Value Ref Range Status   Specimen Description TRACHEAL ASPIRATE  Final   Special Requests NONE  Final   Gram Stain   Final    MODERATE WBC PRESENT,BOTH PMN AND MONONUCLEAR NO SQUAMOUS EPITHELIAL CELLS SEEN NO ORGANISMS SEEN Performed at [facility]   Culture   Final    Non-Pathogenic Oropharyngeal-type Flora Isolated. Performed  at [facility]   Report Status 06/12/2014 FINAL  Final  Culture, blood (routine x 2)     Status: None   Collection Time: 06/19/14 11:54 AM  Result Value Ref Range Status   Specimen Description BLOOD RIGHT ARM  Final   Special Requests BOTTLES DRAWN AEROBIC AND ANAEROBIC 10CC  Final   Culture  Setup Time   Final    06/19/2014 14:51 Performed at [facility]   Culture   Final    NO GROWTH 5 DAYS Performed at [facility]   Report Status 06/25/2014 FINAL  Final  Culture, blood (routine x 2)     Status: None   Collection Time: 06/19/14 12:00 PM  Result Value Ref  Range Status   Specimen Description BLOOD LEFT ARM  Final   Special Requests BOTTLES DRAWN AEROBIC AND ANAEROBIC 10CC  Final   Culture  Setup Time   Final    06/19/2014 14:51 Performed at [facility]   Culture   Final    NO GROWTH 5 DAYS Performed at [facility]   Report Status 06/25/2014 FINAL  Final  Urine culture     Status: None   Collection Time: 06/19/14 12:15 PM  Result Value Ref Range Status   Specimen Description URINE, CATHETERIZED  Final   Special Requests Normal  Final   Culture  Setup Time   Final    06/19/2014 15:13 Performed at [facility]   Colony Count NO GROWTH Performed at [facility]  Final   Culture NO GROWTH Performed at [facility]  Final   Report Status 06/20/2014 FINAL  Final  Clostridium Difficile by PCR     Status: None   Collection Time: 06/19/14  2:28 PM  Result Value Ref Range Status   C difficile by pcr NEGATIVE NEGATIVE Final    Comment: [facility]  Culture, respiratory (NON-Expectorated)     Status: None   Collection Time: 06/25/14 11:49 AM  Result Value Ref Range Status   Specimen Description TRACHEAL ASPIRATE  Final   Special Requests Normal  Final   Gram Stain   Final    RARE WBC PRESENT,BOTH PMN AND MONONUCLEAR NO SQUAMOUS EPITHELIAL CELLS SEEN RARE GRAM POSITIVE COCCI IN PAIRS IN CLUSTERS Performed at [facility]    Culture   Final    Non-Pathogenic Oropharyngeal-type Flora Isolated. Performed at [facility]    Report Status 06/28/2014 FINAL  Final  Culture, blood (routine x 2)     Status: None (Preliminary result)   Collection Time: 06/25/14 11:50 AM  Result Value Ref Range Status   Specimen Description BLOOD RIGHT ARM  Final   Special Requests BOTTLES DRAWN AEROBIC ONLY 5CC  Final   Culture  Setup Time   Final    06/25/2014 14:33 Performed at [facility]   Culture   Final           BLOOD CULTURE RECEIVED NO GROWTH TO DATE CULTURE  WILL BE HELD FOR 5 DAYS BEFORE ISSUING A FINAL NEGATIVE REPORT Performed at [facility]   Report Status PENDING  Incomplete  Culture, blood (routine x 2)     Status: None (Preliminary result)   Collection Time: 06/25/14 11:56 AM  Result Value Ref Range Status   Specimen Description BLOOD LEFT HAND  Final   Special Requests BOTTLES DRAWN AEROBIC ONLY 5CC  Final   Culture  Setup Time   Final  06/25/2014 14:32 Performed at [facility]   Culture   Final           BLOOD CULTURE RECEIVED NO GROWTH TO DATE CULTURE WILL BE HELD FOR 5 DAYS BEFORE ISSUING A FINAL NEGATIVE REPORT Performed at [facility]   Report Status PENDING  Incomplete  Culture, Urine     Status: None   Collection Time: 06/25/14  1:12 PM  Result Value Ref Range Status   Specimen Description URINE, CATHETERIZED  Final   Special Requests Normal  Final   Culture  Setup Time   Final    06/26/2014 00:21 Performed at [facility]   Colony Count NO GROWTH Performed at [facility]  Final   Culture NO GROWTH Performed at [facility]  Final   Report Status 06/27/2014 FINAL  Final  Clostridium Difficile by PCR     Status: None   Collection Time: 06/26/14 12:09 PM  Result Value Ref Range Status   C difficile by pcr NEGATIVE NEGATIVE Final    Comment: Performed at [facility]    Assessment: 38 yo M admitted 10/9 with progressive respiratory failure in setting of EtOH withdrawal and hepatic encephalopathy. He was treated for HCAP with empiric antibiotics - completed course on 10/18. Febrile to 102.6 on 10/24 - empiric antibiotic therapy was resumed with ceftazidime 2 grams IV q8h, but remained febrile on 10/25 and pharmacy was consulted to resume vancomycin dosing. All antibiotics dc'd 10/27.  Pharmacy reconsulted10/30 to resume vancomycin dosing for possible cellulitis at trach site.  10/11 >> levofloxacin >> 10/12 10/12 >> vanc >> 10/16 10/12 >> cefepime >>  10/18 10/17 >> rifaximin (HE) >> 10/26 10/24 >> ceftazidime >> 10/27 10/25 >> vancomycin >> 10/27, restart 10/30 >>  Tmax: 99.3 WBC: remain elevated but improving, 13.8k Renal: SCr 0.69, CrCl > 100 ml/min   10/9 MRSA PCR: positive 10/12 influenza panel: neg 10/13 BAL cx: NGF 10/13 thoracentesis pleural cx: NGF 10/14 trach aspirate: oral flora 10/23 CDiff: neg 10/23 urine: NGF 10/23 blood x 2: NGF 10/29 trach asp: non-pathogenic oral flora 10/29 urine: NGF 10/29 blood x 2: ngtd  Drug level / dose changes info: 10/13 1700 VT: <5 on 1g q8h, incr to 1250mg  q8h 10/15 1700 VT: 8.6 on 1250mg  q8h increase to 1500mg  Q8H 10/27 0130 VT =12.6 mg/L On 1500mg  q8h (VT before 6th dose, includes 2g loading dose), increase to 1750 q8 == 11/1 VT at 1130 = 16.7 on 1500mg  q8h before 7th dose, decrease 1250mg  q8h  Goal of Therapy:  Vancomycin trough level 10-15 mcg/ml  Plan:   Decrease vancomycin to 1250mg  IV q8h to prevent accumulation Follow up renal function & cultures, clinical course  Loralee Pacas, PharmD, BCPS Pager: [phone] 06/28/2014,12:31 PM

## 2014-06-29 ENCOUNTER — Inpatient Hospital Stay (HOSPITAL_COMMUNITY): Payer: BC Managed Care – PPO

## 2014-06-29 LAB — BASIC METABOLIC PANEL
Anion gap: 13 (ref 5–15)
BUN: 17 mg/dL (ref 6–23)
CALCIUM: 8.1 mg/dL — AB (ref 8.4–10.5)
CO2: 22 mEq/L (ref 19–32)
CREATININE: 0.77 mg/dL (ref 0.50–1.35)
Chloride: 111 mEq/L (ref 96–112)
GFR calc non Af Amer: >90 mL/min (ref >90)
Glucose, Bld: 148 mg/dL — ABNORMAL HIGH (ref 70–99)
Potassium: 3.6 mEq/L — ABNORMAL LOW (ref 3.7–5.3)
Sodium: 146 mEq/L (ref 137–147)

## 2014-06-29 MED ORDER — POTASSIUM CHLORIDE CRYS ER 20 MEQ PO TBCR: 40.0000 meq | EXTENDED_RELEASE_TABLET | Freq: Three times a day (TID) | ORAL | Status: DC

## 2014-06-29 MED ORDER — POTASSIUM CHLORIDE 20 MEQ/15ML (10%) PO SOLN
40.0000 meq | Freq: Three times a day (TID) | ORAL | Status: AC
  Administered 2014-06-29 (×2): 40 meq
  Filled 2014-06-29 (×2): qty 30

## 2014-06-29 MED ORDER — RIFAXIMIN 200 MG PO TABS
200.0000 mg | ORAL_TABLET | Freq: Three times a day (TID) | ORAL | Status: DC
  Administered 2014-06-29 – 2014-07-09 (×28): 200 mg via ORAL
  Filled 2014-06-29 (×33): qty 1

## 2014-06-29 MED ORDER — FUROSEMIDE 10 MG/ML IJ SOLN
40.0000 mg | Freq: Four times a day (QID) | INTRAMUSCULAR | Status: AC
  Administered 2014-06-29 (×2): 40 mg via INTRAVENOUS
  Filled 2014-06-29 (×2): qty 4

## 2014-06-29 MED ORDER — CLONAZEPAM 0.1 MG/ML ORAL SUSPENSION: 1.0000 mg | Freq: Two times a day (BID) | ORAL | Status: DC

## 2014-06-29 MED ORDER — LACTULOSE 10 GM/15ML PO SOLN
30.0000 g | Freq: Three times a day (TID) | ORAL | Status: DC
  Administered 2014-06-29 – 2014-07-03 (×13): 30 g
  Filled 2014-06-29 (×15): qty 45

## 2014-06-29 MED ORDER — CLONAZEPAM 1 MG PO TABS
1.0000 mg | ORAL_TABLET | Freq: Two times a day (BID) | ORAL | Status: DC
  Administered 2014-06-29 – 2014-07-04 (×11): 1 mg
  Filled 2014-06-29 (×11): qty 1

## 2014-06-29 NOTE — Progress Notes (Signed)
PULMONARY / CRITICAL CARE MEDICINE  Name: Jerome [name] MRN: [id number] DOB:  December 22, 1975 ADMISSION DATE: 06/05/2014  CONSULTATION DATE: 06/08/2014  REFERRING MD : TRH  CHIEF COMPLAINT: Respiratory Failure     INITIAL PRESENTATION:  38 yo male with hx of ETOH presented with dyspnea/fatigue from profound anemia (Hb 4.5), and received multiple transfusions of PRBC.  Developed progressive respiratory failure with Rt > Lt pulmonary infiltrates, and PCCM assumed care in ICU.  Studies: 10/9 - There is hepatomegaly and splenomegaly consistent with parenchymal<BR>liver disease  SIGNIFICANT EVENTS:  10/09 admitted; PRBC x3, GI consulted 10/10 PRBC x1 10/11 PRBC x1; new onset PNA on chest x ray 10/12: acute hypoxic respiratory failure overnight requiring BiPAP. Alcohol withdrawal requiring precedex gtt. Intubated for decreasing LOC and increasing 02 needs and worsening PNA on CXR.  10/13 bronchoscopy >> respiratory secretions from RLL, friable airways  - 10/13 Rt thoracentesis > 500 ml fluid: transudate  10/14 gastric residuals  - 10/14 Echo >> EF 55 to 60%, PAS 33 mmHg 10/16 acidosis ?from diprivan >> change to versed gtt  06/13/14:  Significant intermittent agitation.  On 45% fio2.hick tenacious ET tube secretions + .  on fent and versed 6mg /h gtt. START LACTULOSE AND XIFAXAN 10/19 fio2 50 % and peep 12 on ARDS protocol 10/20 fio2 40% 8 peep. Slow progress 10/20 self extubation with urgent reintubation and fall off bed to floor 10/22 trached (df) 10/23 agitation remains an issue. 10/25 fever, abx started 10/26 sedated or agitated 10/27 agitation remains an issue. Tolerated t collar 4 hours. 10/28 increase t collar as tolerated. Adjustments in anxiety medications. 10/29 will seek LTAC 10/30 ATC one hour 10/31 ATC only 10 minutes, but PSV 15/5 for 12 hours   Intake/Output Summary (Last 24 hours) at 06/29/14 1114 Last data filed at 06/29/14 1040  Gross per 24 hour  Intake  4850.9 ml  Output   7375 ml  Net -2524.1 ml    SUBJECTIVE/OVERNIGHT/INTERVAL HX ATC only 10 minutes, but PSV 15/5 for 12 hours, more agitated  PHYSICAL EXAM:  Vital signs in last 24 hours: Temp:  [98.9 F (37.2 C)-100.9 F (38.3 C)] 100.7 F (38.2 C) (11/02 0050) Pulse Rate:  [66-118] 86 (11/02 1000) Resp:  [0-37] 37 (11/02 1000) BP: (130-161)/(55-76) 132/56 mmHg (11/02 1000) SpO2:  [95 %-100 %] 95 % (11/02 1000) FiO2 (%):  [40 %] 40 % (11/02 1035) Weight:  [92.5 kg (203 lb 14.8 oz)] 92.5 kg (203 lb 14.8 oz) (11/02 0600) Last BM Date: 06/28/14 VENT SETTINGS: Vent Mode:  [-] PSV;CPAP FiO2 (%):  [40 %] 40 % Set Rate:  [16 bmp] 16 bmp Vt Set:  [580 mL] 580 mL PEEP:  [5 cmH20] 5 cmH20 Pressure Support:  [15 cmH20] 15 cmH20 Plateau Pressure:  [17 cmH20-21 cmH20] 19 cmH20 I/O: Intake/Output      11/01 0701 - 11/02 0700 11/02 0701 - 11/03 0700   I.V. (mL/kg) 1860.9 (20.1) 288.8 (3.1)   NG/GT 2405 200   IV Piggyback 750    Total Intake(mL/kg) 5015.9 (54.2) 488.8 (5.3)   Urine (mL/kg/hr) 6400 (2.9) 1625 (4.2)   Total Output 6400 1625   Net -1384.1 -1136.2          PHYSICAL EXAMINATION:   Gen: agitated delirium but breathing comfortably HEENT: NCAT, Trache with frothy bloody secretions PULM: Rhonchi R > L CV: RRR, no mgr AB: distended, BS+, nontender Ext: edema Neuro: follows commands, nods head to questions  Lab Results: CBC  Recent Labs Lab 06/26/14 0515  06/27/14 0540 06/28/14 0350  HGB 7.0* 7.1* 7.4*  HCT 22.8* 23.6* 24.4*  WBC 17.6* 16.2* 13.8*  PLT 176 227 211    COAGULATION  Recent Labs Lab 06/23/14 0130 06/24/14 0313  INR 1.51* 1.53*    CHEMISTRY  Recent Labs Lab 06/25/14 0333 06/26/14 0515 06/27/14 0540 06/28/14 0350 06/29/14 0334  NA 148* 152* 147 149* 146  K 3.8 3.6* 4.3 3.4* 3.6*  CL 112 115* 114* 116* 111  CO2 24 24 21 22 22   GLUCOSE 168* 193* 155* 149* 148*  BUN 25* 28* 21 19 17   CREATININE 0.88 0.86 0.65 0.69 0.77   CALCIUM 8.4 8.5 8.4 8.3* 8.1*  MG 1.8  --  1.7  --   --   PHOS 3.3  --  3.7  --   --    Estimated Creatinine Clearance: 145.9 mL/min (by C-G formula based on Cr of 0.77).   LIVER  Recent Labs Lab 06/23/14 0130 06/24/14 0313  AST 91*  --   ALT 30  --   ALKPHOS 214*  --   BILITOT 1.6*  --   PROT 7.0  --   ALBUMIN 2.1*  --   INR 1.51* 1.53*     INFECTIOUS No results for input(s): LATICACIDVEN, PROCALCITON in the last 168 hours.   ENDOCRINE CBG (last 3)   Recent Labs  06/28/14 1138 06/28/14 1635 06/28/14 1956  GLUCAP 150* 161* 165*     IMAGING x48h Dg Chest Port 1 View  06/29/2014   CLINICAL DATA:  Respiratory failure.  EXAM: PORTABLE CHEST - 1 VIEW  COMPARISON:  06/28/2014.  FINDINGS: Tracheostomy tube and feeding tube in stable position. Cardiomegaly with pulmonary vascular prominence. Bilateral pulmonary infiltrates again noted. This could be from congestive heart failure pulmonary edema and/or bilateral pneumonia. No significant interim improvement. No pleural effusion or pneumothorax. No acute bony abnormality.  IMPRESSION: 1. Tracheostomy tube and feeding tube in stable position. 2. Persistent cardiomegaly with bilateral pulmonary alveolar infiltrates. Congestive heart failure with pulmonary edema could present in this fashion. Bilateral pneumonia could also be present.   Electronically Signed   By: Maisie Fushomas  Register   On: 06/29/2014 07:17   Dg Chest Port 1 View  06/28/2014   CLINICAL DATA:  Acute respiratory failure with hypoxemia  EXAM: PORTABLE CHEST - 1 VIEW  COMPARISON:  06/27/2014  FINDINGS: Progression of diffuse bilateral airspace disease. No significant pleural effusion.  Tracheostomy remains in good position. Feeding tube enters the stomach with the tip not visualized.  IMPRESSION: Worsening bilateral airspace disease.   Electronically Signed   By: Marlan Palauharles  Clark M.D.   On: 06/28/2014 09:45    Intake/Output Summary (Last 24 hours) at 06/29/14 1114 Last  data filed at 06/29/14 1040  Gross per 24 hour  Intake 4850.9 ml  Output   7375 ml  Net -2524.1 ml   ASSESSMENT / PLAN:  PULMONARY  OETT 10/12 >>10/20 self extubation, re intubated 10/20>>10/22, Trach #6 (DF)>>  A:  Acute on chronic hypoxic respiratory failure 2nd to PNA, and b/l pleural effusions (transudate on Rt).  Prolonged Mechanical Ventilation due to the above + agitation / encephalopathy > 11/1 likely pulm edema P:  Begin PS trials. Lasix. Continue to titrate sedation to RASS 0.  CARDIOVASCULAR  A:  Hypervolemia> improving 10/31 QTc 450 P:  Goal net even to slightly negative. Monitor QTc.  RENAL  Lab Results  Component Value Date   CREATININE 0.77 06/29/2014   CREATININE 0.69 06/28/2014   CREATININE 0.65  06/27/2014   Recent Labs Lab 06/27/14 0540 06/28/14 0350 06/29/14 0334  NA 147 149* 146   A:  Stage III CKD Non gap metabolic acidosis, (resolved) Hypernatremia essentially unchanged   P:  Lasix 40 mg IV q6 hrs x3 doses. Continue free water Chem daily Replace electrolytes as indicated.  GASTROINTESTINAL  A:  Severe protein calorie malnutrition. ETOH with cirrhosis? Vs hepatitis, improving  P:  Continue Tube feeds. H2 blockade. Follow LFT. Lactulose 30 mg PO TID. Ammonia level in AM. Add Rifaximin 220 mg PO TID.   HEMATOLOGIC  Lab Results  Component Value Date   INR 1.53* 06/24/2014   INR 1.51* 06/23/2014   INR 1.52* 06/18/2014   A:  Anemia from chronic GI bleeding >> stable at present. Hepatic coagulopathy P:  CBC intermittently. Follow coags 10/31.  INFECTIOUS  A:  Right > left PNA. HCAP vs aspiration.  Flu negative. S/p cefepime 7d ending 06/14/14 Fevers 10/24 (infection vs medication induced?) Trach site cellulitis  10/23 urine>>>neg 10/23 blood>>>neg 10/23 cdiff>>>neg 10/29 bc x 2>> 10/29uc>> neg 10/29 sputum>>rare gpc cluster 10/30 c.diff > neg  P:  Ceftaz 10/24>>> 10/27 Vanc 10/25>>>10/27  Vanco 10/30  >>>  Continue vancomycin for trach site cellulitis Wound care for trach site   ENDOCRINE  A:  DMII with nephropathy. CBG (last 3)   Recent Labs  06/28/14 1138 06/28/14 1635 06/28/14 1956  GLUCAP 150* 161* 165*   P:  SSI Continue low dose lantus  NEUROLOGIC  A:  Acute encephalopathy 2nd to delirium tremens, respiratory failure, meds, critical illness.  This has been a major challenge during this hospitalization, continue precedex, ativan, fentanyl, valproate >11/1 after dropping fentanyl patch dose he is more awake but pulling at tubes and lines more; QTc 500 today P:  - RASS goal 0 -1  - Continue precedex gtt, max to 3.0, will titrate down once we have better control of his agitation  - Change ativan to clonazepam. - Continue clonidine - Risperdal 2mg  BID, continue to monitor QT-c daily - Continue fentanyl patch, increase to 50mcg today - prn fentanyl pushes - stop Haldol today due to QTc 500 - 10/26 added Depakote  - Foot drop boots - check ammonia - Lactulose and rifaximin  Parents updated bedside and all questions answer.  CC time by me 35 minutes.  Alyson ReedyWesam G. Huie Ghuman, M.D. [facility] Pulmonary/Critical Care Medicine. Pager: [phone]. After hours pager: [phone].

## 2014-06-29 NOTE — Plan of Care (Signed)
Problem: ICU Phase Progression Outcomes Goal: O2 sats trending toward baseline Outcome: Progressing Pt tolerated Trach Collar for 35 minutes, then placed on PSV 15/5 for the remaining time.  Pt remains on Precedex gtt. At 70mg/kg/hr while weaning. Goal: Dyspnea controlled at rest Outcome: Progressing Goal: Pain controlled with appropriate interventions Outcome: Progressing Goal: Flu/PneumoVaccines if indicated Outcome: Completed/Met Date Met:  06/29/14 Goal: Initial discharge plan identified Outcome: Progressing [facility] [place]    Goal: Voiding-avoid urinary catheter unless indicated Outcome: Not Applicable Date Met:  185/02/77 Problem: Phase I Progression Outcomes Goal: Progressing towards optiumm acitivities Outcome: Progressing Passive and active ROM performed.  Pt enjoys moving body and wanting to get OOB, safety concerns remain due to pt being impulsive and frequently attempts to flip OOB.  Close monitoring at all times. Goal: Patient tolerating weaning plan Outcome: Progressing Goal: Optimized method of communication. Outcome: Progressing Pt mouthing words, sometimes completely appropriate and then other times quite confused.  Problem: Phase II Progression Outcomes Goal: Date pt extubated/weaned off vent Outcome: Progressing Goal: Time pt extubated/weaned off vent Outcome: Progressing Goal: Hemodynamically stable Outcome: Completed/Met Date Met:  06/29/14 Goal: Progress activities as ordered Outcome: Progressing Goal: Tolerating prescribed nutrition plan Outcome: Progressing Goal: Pain controlled Outcome: Progressing Goal: Code status re-addressed Outcome: Not Applicable Date Met:  141/28/78Family wanting Full Code status.

## 2014-06-29 NOTE — Progress Notes (Signed)
Called Dr. Vassie LollAlva about pt's cuff leak of 200cc with a cuff pressure of 30cmH20. In order to maintain good seal, the cuff pressure is increased to 65cmH20. Pt is currently on PSV of 15 with 5cm PEEP. At 30cmH20 return Vt is 500cc, At 65cmH20 return Vt is 700cc. PT appears comfortable on PSV with SAT of 98%, RR 14. Extra trach is at bedside if needed. Dr. Vassie LollAlva is OK with cuff leak as long as pt is stable on PSV. RT will continue to monitor.

## 2014-06-30 ENCOUNTER — Inpatient Hospital Stay (HOSPITAL_COMMUNITY): Payer: BC Managed Care – PPO

## 2014-06-30 LAB — BLOOD GAS, ARTERIAL
ACID-BASE EXCESS: 0.1 mmol/L (ref 0.0–2.0)
Bicarbonate: 23.1 mEq/L (ref 20.0–24.0)
Drawn by: 31814
FIO2: 0.4 %
O2 Saturation: 94 %
PEEP: 5 cmH2O
Patient temperature: 98.4
RATE: 16 resp/min
TCO2: 21.8 mmol/L (ref 0–100)
VT: 580 mL
pCO2 arterial: 32 mmHg — ABNORMAL LOW (ref 35.0–45.0)
pH, Arterial: 7.471 — ABNORMAL HIGH (ref 7.350–7.450)
pO2, Arterial: 70.1 mmHg — ABNORMAL LOW (ref 80.0–100.0)

## 2014-06-30 LAB — BASIC METABOLIC PANEL
Anion gap: 13 (ref 5–15)
BUN: 19 mg/dL (ref 6–23)
CHLORIDE: 110 meq/L (ref 96–112)
CO2: 23 mEq/L (ref 19–32)
Calcium: 8.8 mg/dL (ref 8.4–10.5)
Creatinine, Ser: 0.8 mg/dL (ref 0.50–1.35)
GLUCOSE: 158 mg/dL — AB (ref 70–99)
POTASSIUM: 3.9 meq/L (ref 3.7–5.3)
SODIUM: 146 meq/L (ref 137–147)

## 2014-06-30 LAB — GLUCOSE, CAPILLARY
GLUCOSE-CAPILLARY: 132 mg/dL — AB (ref 70–99)
GLUCOSE-CAPILLARY: 150 mg/dL — AB (ref 70–99)
GLUCOSE-CAPILLARY: 151 mg/dL — AB (ref 70–99)
GLUCOSE-CAPILLARY: 159 mg/dL — AB (ref 70–99)
Glucose-Capillary: 150 mg/dL — ABNORMAL HIGH (ref 70–99)
Glucose-Capillary: 154 mg/dL — ABNORMAL HIGH (ref 70–99)
Glucose-Capillary: 155 mg/dL — ABNORMAL HIGH (ref 70–99)
Glucose-Capillary: 146 mg/dL — ABNORMAL HIGH (ref 70–99)
Glucose-Capillary: 171 mg/dL — ABNORMAL HIGH (ref 70–99)
Glucose-Capillary: 155 mg/dL — ABNORMAL HIGH (ref 70–99)
Glucose-Capillary: 178 mg/dL — ABNORMAL HIGH (ref 70–99)
Glucose-Capillary: 156 mg/dL — ABNORMAL HIGH (ref 70–99)

## 2014-06-30 LAB — CBC
HCT: 24.9 % — ABNORMAL LOW (ref 39.0–52.0)
HEMOGLOBIN: 7.3 g/dL — AB (ref 13.0–17.0)
MCH: 25.3 pg — ABNORMAL LOW (ref 26.0–34.0)
MCHC: 29.3 g/dL — AB (ref 30.0–36.0)
MCV: 86.2 fL (ref 78.0–100.0)
Platelets: 203 10*3/uL (ref 150–400)
RBC: 2.89 MIL/uL — ABNORMAL LOW (ref 4.22–5.81)
RDW: 24.3 % — AB (ref 11.5–15.5)
WBC: 18.1 10*3/uL — ABNORMAL HIGH (ref 4.0–10.5)

## 2014-06-30 LAB — PHOSPHORUS: PHOSPHORUS: 3.8 mg/dL (ref 2.3–4.6)

## 2014-06-30 LAB — MAGNESIUM: MAGNESIUM: 1.6 mg/dL (ref 1.5–2.5)

## 2014-06-30 LAB — AMMONIA: AMMONIA: 46 umol/L (ref 11–60)

## 2014-06-30 MED ORDER — VITAL AF 1.2 CAL PO LIQD
1000.0000 mL | ORAL | Status: DC
  Administered 2014-06-30 – 2014-07-02 (×2): 1000 mL
  Filled 2014-06-30 (×5): qty 1000

## 2014-06-30 MED ORDER — FREE WATER
300.0000 mL | Status: DC
  Administered 2014-06-30 – 2014-07-03 (×16): 300 mL

## 2014-06-30 MED ORDER — PHYTONADIONE 5 MG PO TABS
10.0000 mg | ORAL_TABLET | Freq: Every day | ORAL | Status: AC
  Administered 2014-06-30 – 2014-07-01 (×2): 10 mg via ORAL
  Filled 2014-06-30 (×2): qty 2

## 2014-06-30 MED ORDER — CLONIDINE HCL 0.3 MG PO TABS
0.3000 mg | ORAL_TABLET | Freq: Three times a day (TID) | ORAL | Status: DC
  Administered 2014-06-30 – 2014-07-05 (×15): 0.3 mg via ORAL
  Filled 2014-06-30 (×16): qty 1

## 2014-06-30 MED ORDER — FUROSEMIDE 10 MG/ML IJ SOLN
40.0000 mg | Freq: Two times a day (BID) | INTRAMUSCULAR | Status: DC
  Administered 2014-06-30 – 2014-07-01 (×4): 40 mg via INTRAVENOUS
  Filled 2014-06-30 (×4): qty 4

## 2014-06-30 MED ORDER — POTASSIUM CHLORIDE 20 MEQ/15ML (10%) PO SOLN
40.0000 meq | Freq: Two times a day (BID) | ORAL | Status: DC
  Administered 2014-06-30 – 2014-07-01 (×4): 40 meq
  Filled 2014-06-30 (×4): qty 30

## 2014-06-30 NOTE — Progress Notes (Signed)
PULMONARY / CRITICAL CARE MEDICINE  Name: Dion BodyWilliam Sellman MRN: [id number] DOB:  10/28/1975 ADMISSION DATE: 06/05/2014  CONSULTATION DATE: 06/08/2014  REFERRING MD : TRH  CHIEF COMPLAINT: Respiratory Failure     INITIAL PRESENTATION:  38 yo male with hx of ETOH presented with dyspnea/fatigue from profound anemia (Hb 4.5), and received multiple transfusions of PRBC.  Developed progressive respiratory failure with Rt > Lt pulmonary infiltrates, and PCCM assumed care in ICU.  Studies: 10/9 - There is hepatomegaly and splenomegaly consistent with parenchymal<BR>liver disease  SIGNIFICANT EVENTS:  10/09 admitted; PRBC x3, GI consulted 10/10 PRBC x1 10/11 PRBC x1; new onset PNA on chest x ray 10/12: acute hypoxic respiratory failure overnight requiring BiPAP. Alcohol withdrawal requiring precedex gtt. Intubated for decreasing LOC and increasing 02 needs and worsening PNA on CXR.  10/13 bronchoscopy >> respiratory secretions from RLL, friable airways  - 10/13 Rt thoracentesis > 500 ml fluid: transudate  10/14 gastric residuals  - 10/14 Echo >> EF 55 to 60%, PAS 33 mmHg 10/16 acidosis ?from diprivan >> change to versed gtt  06/13/14:  Significant intermittent agitation.  On 45% fio2.hick tenacious ET tube secretions + .  on fent 150mcg and versed 6mg /h gtt. START LACTULOSE AND XIFAXAN 10/19 fio2 50 % and peep 12 on ARDS protocol 10/20 fio2 40% 8 peep. Slow progress 10/20 self extubation with urgent reintubation and fall off bed to floor 10/22 trached (df) 10/23 agitation remains an issue. 10/25 fever, abx started 10/26 sedated or agitated 10/27 agitation remains an issue. Tolerated t collar 4 hours. 10/28 increase t collar as tolerated. Adjustments in anxiety medications. 10/29 will seek LTAC 10/30 ATC one hour 10/31 ATC only 10 minutes, but PSV 15/5 for 12 hours   Intake/Output Summary (Last 24 hours) at 06/30/14 0857 Last data filed at 06/30/14 0819  Gross per 24 hour  Intake  4815.8 ml  Output   8538 ml  Net -3722.2 ml    SUBJECTIVE/OVERNIGHT/INTERVAL HX ATC only 10 minutes, but PSV 15/5 for 12 hours, more agitated  PHYSICAL EXAM:  Vital signs in last 24 hours: Temp:  [98.4 F (36.9 C)-101.9 F (38.8 C)] 98.4 F (36.9 C) (11/03 0300) Pulse Rate:  [67-101] 69 (11/03 0700) Resp:  [10-43] 23 (11/03 0700) BP: (114-157)/(48-82) 138/56 mmHg (11/03 0700) SpO2:  [95 %-100 %] 99 % (11/03 0700) FiO2 (%):  [40 %] 40 % (11/03 0800) Weight:  [88.1 kg (194 lb 3.6 oz)] 88.1 kg (194 lb 3.6 oz) (11/03 0500) Last BM Date: 06/30/14 VENT SETTINGS: Vent Mode:  [-] PRVC FiO2 (%):  [40 %] 40 % Set Rate:  [16 bmp] 16 bmp Vt Set:  [580 mL] 580 mL PEEP:  [5 cmH20] 5 cmH20 Pressure Support:  [15 cmH20] 15 cmH20 Plateau Pressure:  [15 cmH20-19 cmH20] 16 cmH20 I/O: Intake/Output      11/02 0701 - 11/03 0700 11/03 0701 - 11/04 0700   I.V. (mL/kg) 1928 (21.9) 82.2 (0.9)   NG/GT 2250 50   IV Piggyback 750    Total Intake(mL/kg) 4928 (55.9) 132.2 (1.5)   Urine (mL/kg/hr) 8510 (4) 200 (1.2)   Stool 3 (0)    Total Output 8513 200   Net -3585 -67.8        Stool Occurrence 1 x      PHYSICAL EXAMINATION:   Gen:no distress HEENT: NCAT, Trache with frothy bloody secretions PULM: Rhonchi R > L CV: RRR, no mgr AB: distended, BS+, nontender Ext: edema Neuro: follows commands, nods head to questions  Lab Results: CBC  Recent Labs Lab 06/27/14 0540 06/28/14 0350 06/30/14 0325  HGB 7.1* 7.4* 7.3*  HCT 23.6* 24.4* 24.9*  WBC 16.2* 13.8* 18.1*  PLT 227 211 203    COAGULATION  Recent Labs Lab 06/24/14 0313  INR 1.53*    CHEMISTRY  Recent Labs Lab 06/25/14 0333 06/26/14 0515 06/27/14 0540 06/28/14 0350 06/29/14 0334 06/30/14 0325  NA 148* 152* 147 149* 146 146  K 3.8 3.6* 4.3 3.4* 3.6* 3.9  CL 112 115* 114* 116* 111 110  CO2 24 24 21 22 22 23   GLUCOSE 168* 193* 155* 149* 148* 158*  BUN 25* 28* 21 19 17 19   CREATININE 0.88 0.86 0.65 0.69 0.77  0.80  CALCIUM 8.4 8.5 8.4 8.3* 8.1* 8.8  MG 1.8  --  1.7  --   --  1.6  PHOS 3.3  --  3.7  --   --  3.8   Estimated Creatinine Clearance: 142.6 mL/min (by C-G formula based on Cr of 0.8).   LIVER  Recent Labs Lab 06/24/14 0313  INR 1.53*     INFECTIOUS No results for input(s): LATICACIDVEN, PROCALCITON in the last 168 hours.   ENDOCRINE CBG (last 3)   Recent Labs  06/28/14 1138 06/28/14 1635 06/28/14 1956  GLUCAP 150* 161* 165*     IMAGING x48h Dg Chest Port 1 View  06/30/2014   CLINICAL DATA:  38 year old diabetic hypertensive male with history of alcohol abuse. Respiratory failure. Subsequent encounter.  EXAM: PORTABLE CHEST - 1 VIEW  COMPARISON:  06/29/2014.  FINDINGS: Rotation to the right.  Tracheostomy tube tip midline.  Feeding tube courses below the diaphragm. Tip is not included on the present exam.  Cardiomegaly.  Pulmonary vascular congestion/ pulmonary edema similar to prior exam.  Nodularity peripheral aspect right lung. Recommend followup until clearance.  Medial left base consolidation may represent atelectasis. Underlying infiltrate or mass not excluded. Attention to this on follow up.  Remote rib fractures bilaterally.  IMPRESSION: No significant change.  Please see above.   Electronically Signed   By: Bridgett LarssonSteve  Olson M.D.   On: 06/30/2014 07:41   Dg Chest Port 1 View  06/29/2014   CLINICAL DATA:  Respiratory failure.  EXAM: PORTABLE CHEST - 1 VIEW  COMPARISON:  06/28/2014.  FINDINGS: Tracheostomy tube and feeding tube in stable position. Cardiomegaly with pulmonary vascular prominence. Bilateral pulmonary infiltrates again noted. This could be from congestive heart failure pulmonary edema and/or bilateral pneumonia. No significant interim improvement. No pleural effusion or pneumothorax. No acute bony abnormality.  IMPRESSION: 1. Tracheostomy tube and feeding tube in stable position. 2. Persistent cardiomegaly with bilateral pulmonary alveolar infiltrates.  Congestive heart failure with pulmonary edema could present in this fashion. Bilateral pneumonia could also be present.   Electronically Signed   By: Maisie Fushomas  Register   On: 06/29/2014 07:17  PCXR: edema/ bibasilar atx; persistent LUL nodule    Intake/Output Summary (Last 24 hours) at 06/30/14 0857 Last data filed at 06/30/14 0819  Gross per 24 hour  Intake 4815.8 ml  Output   8538 ml  Net -3722.2 ml   ASSESSMENT / PLAN:  PULMONARY  OETT 10/12 >>10/20 self extubation, re intubated 10/20>>10/22, Trach #6 (DF)>>  A:  Acute on chronic hypoxic respiratory failure 2nd to PNA, and b/l pleural effusions (transudate on Rt).  Prolonged Mechanical Ventilation due to the above + agitation / encephalopathy Trach cuff leak No sig change in aeration on CXR  Tolerating PS, but not ATC.  P:  Attempt TC today but patient is already reporting unable to breath Will change trach to distal XLT cuffed size 6 today. Lasix.for neg fluid balance  Continue to titrate sedation to RASS 0.  CARDIOVASCULAR  A:  Hypervolemia> improving P:  Goal net even to slightly negative. Monitor QTc.  RENAL  A:  Stage III CKD Non gap metabolic acidosis, (resolved) Hypernatremia essentially unchanged   P:  Lasix 40 mg q12 Continue free water Chem daily Replace electrolytes as indicated.  GASTROINTESTINAL  A:  Severe protein calorie malnutrition. ETOH with cirrhosis? Vs hepatitis, improving  P:  Continue Tube feeds. H2 blockade. Follow LFT. Lactulose 30 mg PO TID. Cont Rifaximin 220 mg PO TID.  If able to stay of the vent will order swallow evaluation, if not then will need a PEG towards the end of the week (hopefully we can avoid given ascites).  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 Hepatic coagulopathy Bloody tracheal secretions  P: Two doses of Vit K (start 11/3) Repeat coags and CBC 11/5  INFECTIOUS  A:  Right > left PNA. HCAP vs aspiration.  Flu  negative. S/p cefepime 7d ending 06/14/14 Fevers 10/24 (infection vs medication induced?) Trach site cellulitis  10/23 urine>>>neg 10/23 blood>>>neg 10/23 cdiff>>>neg 10/29 bc x 2>> 10/29uc>> neg 10/29 sputum>>normal flora  10/30 c.diff > neg  P:  Ceftaz 10/24>>> 10/27 Vanc 10/25>>>10/27  Vanco 10/30 >>>  Continue vancomycin for trach site cellulitis Wound care for trach site, will complete 7 days   ENDOCRINE  A:  DMII with nephropathy. P:  SSI Continue low dose lantus  NEUROLOGIC  A:  Acute encephalopathy 2nd to delirium tremens, respiratory failure, meds, critical illness.   P:  - RASS goal 0 -1  - Continue precedex gtt, will increase Clonazepam to 3mg  tid and decrease Precedex ceiling to 2.5 - Continue clonazepam. - Risperdal 2mg  BID, continue to monitor QT-c daily - Continue fentanyl patch @ 58mcg/hr - prn fentanyl pushes - 10/26 added Depakote  - Foot drop boots - continue Lactulose and rifaximin - get him OOB/ try to establish normal day/night routine.   Plan of care was discussed at length with the family at bedside w/ the RN present. All questions answered 11/3   Anders Simmonds ACNP-BC [facility] Pulmonary/Critical Care Pager # [phone] OR # [phone] if no answer  Attending Note: Change trach today, push TC as able, full vent support at night, will need to determine need for PEG tube, continue lactulose and begin taper down of the precedex now that patient is more awake.  CC time 35 min.  Patient seen and examined, agree with above note.  I dictated the care and orders written for this patient under my direction.  Alyson Reedy, MD [phone]

## 2014-06-30 NOTE — Progress Notes (Signed)
NUTRITION FOLLOW UP  Intervention:    -Advance Vital AF 1.2 to new goal rate of 55 ml/hr -Continue Pro-Stat 30 ml BID -Tube feeding regimen will provide 1784 kcal (95% est kcal needs), 129 gram protein (100% est protein needs) and 1070 ml free water -RD to continue to follow  Nutrition Dx:   Inadequate oral intake related to inability to eat as evidenced by npo status; ongoing   Goal:   Diet advancement. Patient to meet estimated needs with meals and supplements.   New Goal: TF to meet >/= 90% of their estimated nutrition needs; will meet with new goal rate  Monitor:   TF tolerance, total protein/energy intake, labs, weights, diet order, sedation  Assessment:   10/12:  NPO on c pap. Currently being intubated.  Ate 1/2 omelet Saturday. Mother reports that patient has been afraid of eating for some time for fear of vomiting.  Patient last seen by RD 03/20/14. Diagnosed with severe malnutrition at that time due to weight loss and poor intake.  Weight has increased from 190 lbs in July to 208 lbs currently. Probably fluid gain.  Intake has been poor prior to admit.  Patient is currently intubated on ventilator support  MV: 11.6 L/min  Temp (24hrs), Avg:99 F (37.2 C), Min:98.3 F (36.8 C), Max:99.7 F (37.6 C)   Propofol: 20 ml/hr   10/13: Patient remains intubated on ventilator support MV: 11.2 L/min Temp (24hrs), Avg:100.1 F (37.8 C), Min:98.4 F (36.9 C), Max:101.9 F (38.8 C)  Propofol: 36.8 ml/hr  Vital AF 1.2 initiated at 20 ml/hr on 10/13 with goal rate of 60 ml/hr. RD to order adult enteral protocol.  MD noted pt increasingly agitated on ventilator. Propofol level increased, providing approximately 972 kcal/day. Low K levels being repleted. Weight increased 8 lbs since admit, likely d/t fluid Pt s/p thoracentesis with 500 ml fluid removal  10/15: -Pt's tube feeding on hold d/t elevated gastric residuals.  -Vital AF 1.2 had been running at 25 ml/hr. On 10/14,  residuals > 500. OGT clamped. Residuals 350 ml on 10/15, bile looking appearance per RN -Per discussion with RN, pt's tube feed to be held for 24 hours before restarting. Gastric residuals likely related to sedation, and bowel function will likely improve with sedation weaning. RN noted only minimal bowel sounds -Propofol was decreased earlier this morning to 19.8 ml/hr for WUA; and then was increased back to 36.8 ml/hr  -MD recommended f/u abd xray -Continue with current TF recommendation to be reinitiated per MD discretion, will monitor propofol levels/possible weaning and adjust as warranted -Total bili elevated, likely d/t cirrhosis of liver and hx of ETOH -Weight increased from 40 lb from admit, likely d/t fluid as pt with +2 generalized edema and +5 L fluid balance  10/16: -Received consult to initiate TF. Per discussion with RN, MD requesting trickle feeds at this time to assess tolerance, and increase to goal rate when able -Pharm noted pt with elevated triglycerides (> 400). Per RN, propofol is d/c and Versed will be used for sedation -Phosphorus slightly low at 2.1, K/Mg WNL  10/19: -Pt with elevated ammonia, lactulose ordered TID.  -Assisted in improving constipation; however resulted in ongoing loose stools. Plan to decrease lactulose and add H2 blockade -Oxepa to continue as 20 ml/hr trickle feeds until loose stools episodes decrease. 60 ml residuals per RN  -Pt being diuresed, -2 L fluid balance, and only 4 lbs increased from admit weight. -Mg repleted, Phos/Mg/K all WNL  10/21: -Pt receiving  Oxepa at 30 ml/hr, which provides 1080 kcal (57% est kcal needs), and 45 gram protein (66% re-est protein needs) -Ordered Pro-Stat 30 ml once daily to provide 100 kcal, 15 gram protein -0ml residuals noted, continues with loose stools d/t lactulose. - Lactulose not decreased d/t continued hepatic encephalopathy. Modified protein requirements, and will increase as warranted -Phos/K//Mg WNL,  glucose slightly elevated -Pt self extubated on 10/20, and was reintubated. Plan for trach placement on 10/22  10/23: -Pt s/p trach placement on 10/22 -Continues to receive Oxepa at 30 ml/hr, 0 ml residuals -Continues with loose stools, rectal tube placed on 10/22. MD plan to decrease lactulose. Currently ordered TID  10/28: -Tolerating Oxepa at goal rate of 50 ml/hr with prostat 30 ml once daily, providing 1900 kcal (100% est kcal needs), and 90 gram protein (90% est protein needs) -Per MD, pt no longer requiring full ARDS protocol -Will modify to Vital AF 1.2 -Edema improving, net +1.5 L  11/03: -Vital AF 1.2 infusing at 50 ml/hr, did not advance d/t abd distension -Tolerating w/out residuals. Receiving Pro-Stat 30 ml BID -Per discussion with RN, pt has been having multiple episodes of loose stools last night and yesterday; however has not had one yet today. Abd continues to be distended, but tender. Will trial advancement of Vital AF 1.2 to 55 ml/hr and assess tolerance -On 10/31, RN noted stage 2 pressure ulcer on ear and shoulder. Will increase estimated protein needs. -Pt with improvement in edema, none noted.  -Receiving 300 ml free water flushes 6 times daily to assist with hypernatremia, provides additional 1800 ml free water. TF will provide 1070 ml free water; equalling total of 2870 ml free water  Height: Ht Readings from Last 1 Encounters:  06/20/14 5\' 10"  (1.778 m)    Weight Status:   Wt Readings from Last 1 Encounters:  06/30/14 194 lb 3.6 oz (88.1 kg)  06/05/14 208 lb (94.5 kg)  Re-estimated needs:  Kcal: 1883 Protein: 115-125 gram Fluid: 2.4L  Skin: WDL  Diet Order: Diet NPO time specified   Intake/Output Summary (Last 24 hours) at 06/30/14 1002 Last data filed at 06/30/14 0819  Gross per 24 hour  Intake 4571.4 ml  Output   8388 ml  Net -3816.6 ml    Last BM: 10/27   Labs:   Recent Labs Lab 06/25/14 0333  06/27/14 0540 06/28/14 0350  06/29/14 0334 06/30/14 0325  NA 148*  < > 147 149* 146 146  K 3.8  < > 4.3 3.4* 3.6* 3.9  CL 112  < > 114* 116* 111 110  CO2 24  < > 21 22 22 23   BUN 25*  < > 21 19 17 19   CREATININE 0.88  < > 0.65 0.69 0.77 0.80  CALCIUM 8.4  < > 8.4 8.3* 8.1* 8.8  MG 1.8  --  1.7  --   --  1.6  PHOS 3.3  --  3.7  --   --  3.8  GLUCOSE 168*  < > 155* 149* 148* 158*  < > = values in this interval not displayed.  CBG (last 3)   Recent Labs  06/28/14 1138 06/28/14 1635 06/28/14 1956  GLUCAP 150* 161* 165*    Scheduled Meds: . antiseptic oral rinse  7 mL Mouth Rinse QID  . chlorhexidine  15 mL Mouth Rinse BID  . clonazePAM  1 mg Per Tube BID  . cloNIDine  0.3 mg Oral TID  . feeding supplement (PRO-STAT SUGAR FREE 64)  30 mL Per Tube BID  . fentaNYL  50 mcg Transdermal Q72H  . free water  300 mL Per Tube 6 times per day  . furosemide  40 mg Intravenous Q12H  . insulin aspart  0-9 Units Subcutaneous 6 times per day  . insulin glargine  10 Units Subcutaneous Daily  . lactulose  30 g Per Tube TID  . phytonadione  10 mg Oral Daily  . potassium chloride  40 mEq Per Tube Q12H  . ranitidine  150 mg Oral QHS  . rifaximin  200 mg Oral 3 times per day  . risperiDONE  2 mg Oral BID  . sodium chloride  3 mL Intravenous Q12H  . Valproic Acid  250 mg Oral BID  . vancomycin  1,250 mg Intravenous Q8H    Continuous Infusions: . sodium chloride 10 mL/hr at 06/30/14 0700  . dexmedetomidine 3.002 mcg/kg/hr (06/30/14 0948)  . feeding supplement (VITAL AF 1.2 CAL)      Lloyd Huger MS RD LDN Clinical Dietitian Pager:[phone]

## 2014-06-30 NOTE — Procedures (Signed)
First Tracheostomy Change  Patient leaking with shiley trach, changer placed, trach removed, size 6 cuffed distal extra-long placed.  Good color change, will order CXR and f/u.  Alyson ReedyWesam G. Javarri Segal, M.D. [facility]/Critical [facility] Medicine. Pager: [phone]. After hours pager: [phone].

## 2014-07-01 LAB — CULTURE, BLOOD (ROUTINE X 2)
CULTURE: NO GROWTH
Culture: NO GROWTH

## 2014-07-01 LAB — BASIC METABOLIC PANEL
ANION GAP: 14 (ref 5–15)
BUN: 20 mg/dL (ref 6–23)
CHLORIDE: 112 meq/L (ref 96–112)
CO2: 23 meq/L (ref 19–32)
Calcium: 9 mg/dL (ref 8.4–10.5)
Creatinine, Ser: 0.78 mg/dL (ref 0.50–1.35)
GFR calc Af Amer: >90 mL/min (ref >90)
GFR calc non Af Amer: >90 mL/min (ref >90)
GLUCOSE: 151 mg/dL — AB (ref 70–99)
Potassium: 3.8 mEq/L (ref 3.7–5.3)
SODIUM: 149 meq/L — AB (ref 137–147)

## 2014-07-01 LAB — GLUCOSE, CAPILLARY
GLUCOSE-CAPILLARY: 130 mg/dL — AB (ref 70–99)
GLUCOSE-CAPILLARY: 135 mg/dL — AB (ref 70–99)
Glucose-Capillary: 139 mg/dL — ABNORMAL HIGH (ref 70–99)
Glucose-Capillary: 153 mg/dL — ABNORMAL HIGH (ref 70–99)
Glucose-Capillary: 141 mg/dL — ABNORMAL HIGH (ref 70–99)

## 2014-07-01 MED ORDER — ONDANSETRON HCL 4 MG/2ML IJ SOLN
4.0000 mg | Freq: Three times a day (TID) | INTRAMUSCULAR | Status: DC | PRN
  Administered 2014-07-01: 4 mg via INTRAVENOUS
  Filled 2014-07-01: qty 2

## 2014-07-01 MED ORDER — DEXTROSE 5 % IV SOLN
INTRAVENOUS | Status: DC
  Administered 2014-07-01: 10:00:00 via INTRAVENOUS

## 2014-07-01 NOTE — Evaluation (Signed)
Speech Language Pathology Evaluation Patient Details Name: Jerome BodyWilliam Jester MRN: [id number] DOB: 09/28/1975 Today's Date: 07/01/2014 Time: 1355-1440 SLP Time Calculation (min): 45 min  Problem List:  Patient Active Problem List   Diagnosis Date Noted  . Acute on chronic respiratory failure 06/19/2014  . Tracheostomy status 06/19/2014  . Encephalopathy acute 06/13/2014  . Hepatic encephalopathy 06/13/2014  . CAP (community acquired pneumonia) 06/07/2014  . Acute blood loss anemia 06/05/2014  . Chronic kidney disease (CKD), stage III (moderate) 06/05/2014  . Type 2 diabetes mellitus with stage 3 chronic kidney disease 06/05/2014  . Esophageal varices in alcoholic cirrhosis 06/05/2014  . Protein-calorie malnutrition, severe 03/21/2014  . Diabetes mellitus 03/19/2014  . Hypertension 03/19/2014  . Alcohol abuse 03/19/2014  . Anemia 03/19/2014   Past Medical History:  Past Medical History  Diagnosis Date  . Diabetes mellitus without complication   . Hypertension    Past Surgical History:  Past Surgical History  Procedure Laterality Date  . Esophagogastroduodenoscopy N/A 03/20/2014    Procedure: ESOPHAGOGASTRODUODENOSCOPY (EGD);  Surgeon: Shirley FriarVincent C. 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 Patient [facility] where he was getting detox from alcohol and was found to have low hemoglobin. Patient received 2 unit PRBC , was seen by [facility] and underwent EGD which showed nonbleeding distal esophageal varices, gastritis and portal gastropathy. Patient was discharged home and was supposed to followup [place]. Patient reports seeing his PCP once  after discharge but did not have any followup lab work . He reports that he was drinking every day  but has cut down to drinking every other day and drinks about 5-to 6 ounces of vodka . He reports having hand tremors on the days he doesn't drink. He informs feeling fatigued and tired for about 7 days. Also reports poor appetite. Developed progressive respiratory failure with Rt > Lt pulmonary infiltrates, and PCCM assumed care in ICU.  Required oral intubation 06/13/14,  self-extubated 10/20, trached 10/22, changed to XLT #6 cuffed shiley; trach collar 11/3, on vent at night; ATC 11/4.   Assessment / Plan / Recommendation Clinical Impression  Patient tolerated cuff deflation and initial PMSV trial well, with immediate phonation obtained once PMSV was placed.  Pt had normal voice quality, but low vocal intensity, requiring frequent cues to inhale prior to speaking, for increased volume.  Positive strong cough noted, with bloody secretions coughed from trach.  Parents were present throughout session, and were excited to hear pt respond to questions.  Pt closed his eyes and stated he was tired, therefore, PMSV was removed.  Family was educated to plan for brief trials (15-30 minutes) with supervision via SLP, RT, and/or RN for now.  Educated to cuff deflation prior to PMSV placement, and removal of PMSV if sleeping.      SLP Assessment  Patient needs continued Speech Lanaguage Pathology Services    Follow Up Recommendations       Frequency and Duration min 3x week  2 weeks   Pertinent Vitals/Pain Pain Assessment: No/denies pain   SLP Goals  Patient/Family Stated Goal: "It would help him not be frustrated if he could tell us what he  wants." Potential to Achieve Goals: Good  SLP Evaluation Prior Functioning      Cognition       Comprehension       Expression     Oral / Motor Motor Speech Intelligibility: Intelligibility reduced Word: 75-100% accurate Phrase: 75-100% accurate Sentence:  75-100% accurate Conversation: 75-100% accurate   GO     Maryjo RochesterWillis, Ellenora Talton T 07/01/2014, 3:12 PM

## 2014-07-01 NOTE — Progress Notes (Signed)
PULMONARY / CRITICAL CARE MEDICINE  Name: Jerome [name] MRN: [id number] DOB:  10/28/1975 ADMISSION DATE: 06/05/2014  CONSULTATION DATE: 06/08/2014  REFERRING MD : TRH  CHIEF COMPLAINT: Respiratory Failure     INITIAL PRESENTATION:  38 yo male with hx of ETOH presented with dyspnea/fatigue from profound anemia (Hb 4.5), and received multiple transfusions of PRBC.  Developed progressive respiratory failure with Rt > Lt pulmonary infiltrates, and PCCM assumed care in ICU.  Studies: 10/9 - There is hepatomegaly and splenomegaly consistent with parenchymal<BR>liver disease  SIGNIFICANT EVENTS:  10/09 admitted; PRBC x3, GI consulted 10/10 PRBC x1 10/11 PRBC x1; new onset PNA on chest x ray 10/12: acute hypoxic respiratory failure overnight requiring BiPAP. Alcohol withdrawal requiring precedex gtt. Intubated for decreasing LOC and increasing 02 needs and worsening PNA on CXR.  10/13 bronchoscopy >> respiratory secretions from RLL, friable airways  - 10/13 Rt thoracentesis > 500 ml fluid: transudate  10/14 gastric residuals  - 10/14 Echo >> EF 55 to 60%, PAS 33 mmHg 10/16 acidosis ?from diprivan >> change to versed gtt  06/13/14:  Significant intermittent agitation.  On 45% fio2.hick tenacious ET tube secretions + .  on fent 150mcg and versed 6mg /h gtt. START LACTULOSE AND XIFAXAN 10/19 fio2 50 % and peep 12 on ARDS protocol 10/20 fio2 40% 8 peep. Slow progress 10/20 self extubation with urgent reintubation and fall off bed to floor 10/22 trached (df) 10/23 agitation remains an issue. 10/25 fever, abx started 10/26 sedated or agitated 10/27 agitation remains an issue. Tolerated t collar 4 hours. 10/28 increase t collar as tolerated. Adjustments in anxiety medications. 10/29 will seek LTAC 10/30 ATC one hour 10/31 ATC only 10 minutes, but PSV 15/5 for 12 hours 11/3: Changed trach to 6 XLT distal due to high cuff pressures required to get Vt on vent. Tolerated all day on ATC. Trying  to taper precedex from 3mg /hr down to 2 mg/hr 11/4: OOB, using walker. Still on precedex.   Intake/Output Summary (Last 24 hours) at 07/01/14 0858 Last data filed at 07/01/14 0700  Gross per 24 hour  Intake 4623.29 ml  Output   4325 ml  Net 298.29 ml    SUBJECTIVE/OVERNIGHT/INTERVAL HX No distress. Up in chair. Progressing   PHYSICAL EXAM:  Vital signs in last 24 hours: Temp:  [98.3 F (36.8 C)-100.4 F (38 C)] 99.3 F (37.4 C) (11/04 0400) Pulse Rate:  [65-92] 92 (11/04 0700) Resp:  [0-46] 20 (11/04 0700) BP: (115-148)/(44-80) 123/44 mmHg (11/04 0600) SpO2:  [92 %-100 %] 93 % (11/04 0700) FiO2 (%):  [40 %] 40 % (11/04 0450) Weight:  [85.4 kg (188 lb 4.4 oz)] 85.4 kg (188 lb 4.4 oz) (11/04 0500) Last BM Date: 06/30/14 VENT SETTINGS: Vent Mode:  [-] PRVC FiO2 (%):  [40 %] 40 % Set Rate:  [16 bmp] 16 bmp Vt Set:  [580 mL] 580 mL PEEP:  [5 cmH20] 5 cmH20 Plateau Pressure:  [15 cmH20-17 cmH20] 17 cmH20 I/O: Intake/Output      11/03 0701 - 11/04 0700 11/04 0701 - 11/05 0700   I.V. (mL/kg) 1525.5 (17.9)    Other 650    NG/GT 2080    IV Piggyback 500    Total Intake(mL/kg) 4755.5 (55.7)    Urine (mL/kg/hr) 4525 (2.2)    Stool     Total Output 4525     Net +230.5          Stool Occurrence 4 x      PHYSICAL EXAMINATION:   Gen:no  distress HEENT: NCAT, yellow pink tinged thick but improved secretions PULM: Rhonchi scattered  CV: RRR, no mgr AB: distended, BS+, nontender Ext: edema Neuro: follows commands, nods head to questions  Lab Results: CBC  Recent Labs Lab 06/27/14 0540 06/28/14 0350 06/30/14 0325  HGB 7.1* 7.4* 7.3*  HCT 23.6* 24.4* 24.9*  WBC 16.2* 13.8* 18.1*  PLT 227 211 203    COAGULATION No results for input(s): INR in the last 168 hours.  CHEMISTRY  Recent Labs Lab 06/25/14 0333  06/27/14 0540 06/28/14 0350 06/29/14 0334 06/30/14 0325 07/01/14 0532  NA 148*  < > 147 149* 146 146 149*  K 3.8  < > 4.3 3.4* 3.6* 3.9 3.8  CL 112   < > 114* 116* 111 110 112  CO2 24  < > 21 22 22 23 23   GLUCOSE 168*  < > 155* 149* 148* 158* 151*  BUN 25*  < > 21 19 17 19 20   CREATININE 0.88  < > 0.65 0.69 0.77 0.80 0.78  CALCIUM 8.4  < > 8.4 8.3* 8.1* 8.8 9.0  MG 1.8  --  1.7  --   --  1.6  --   PHOS 3.3  --  3.7  --   --  3.8  --   < > = values in this interval not displayed. Estimated Creatinine Clearance: 131.8 mL/min (by C-G formula based on Cr of 0.78).   LIVER No results for input(s): AST, ALT, ALKPHOS, BILITOT, PROT, ALBUMIN, INR in the last 168 hours.   INFECTIOUS No results for input(s): LATICACIDVEN, PROCALCITON in the last 168 hours.   ENDOCRINE CBG (last 3)   Recent Labs  06/30/14 1943 06/30/14 2353 07/01/14 0411  GLUCAP 156* 139* 130*     IMAGING x48h Dg Chest Port 1 View  06/30/2014   CLINICAL DATA:  38 year old male with change in tracheostomy. Diabetic hypertensive patient. Subsequent encounter.  EXAM: PORTABLE CHEST - 1 VIEW  COMPARISON:  06/30/2014 4:55 a.m.  FINDINGS: Tracheostomy tube tip is midline 3 cm above the carina.  Tip of feeding tube and not well delineated secondary to technique.  Cardiomegaly.  Elevated right hemidiaphragm.  Pulmonary vascular congestion most notable centrally.  Limited evaluation of mediastinal structures.  IMPRESSION: Tracheostomy tube tip is midline 3 cm above the carina.  Tip of feeding tube and not well delineated secondary to technique.  Cardiomegaly.  Elevated right hemidiaphragm.  Pulmonary vascular congestion most notable centrally.   Electronically Signed   By: Bridgett Larsson M.D.   On: 06/30/2014 12:05   Dg Chest Port 1 View  06/30/2014   CLINICAL DATA:  38 year old diabetic hypertensive male with history of alcohol abuse. Respiratory failure. Subsequent encounter.  EXAM: PORTABLE CHEST - 1 VIEW  COMPARISON:  06/29/2014.  FINDINGS: Rotation to the right.  Tracheostomy tube tip midline.  Feeding tube courses below the diaphragm. Tip is not included on the  present exam.  Cardiomegaly.  Pulmonary vascular congestion/ pulmonary edema similar to prior exam.  Nodularity peripheral aspect right lung. Recommend followup until clearance.  Medial left base consolidation may represent atelectasis. Underlying infiltrate or mass not excluded. Attention to this on follow up.  Remote rib fractures bilaterally.  IMPRESSION: No significant change.  Please see above.   Electronically Signed   By: Bridgett Larsson M.D.   On: 06/30/2014 07:41  PCXR: edema/ bibasilar atx; persistent LUL nodule    Intake/Output Summary (Last 24 hours) at 07/01/14 0858 Last data filed at 07/01/14  0700  Gross per 24 hour  Intake 4623.29 ml  Output   4325 ml  Net 298.29 ml   ASSESSMENT / PLAN:  PULMONARY  OETT 10/12 >>10/20 self extubation, re intubated 10/20>>10/22, Trach #6 (DF)>>  A:  Acute on chronic hypoxic respiratory failure 2nd to PNA, and b/l pleural effusions (transudate on Rt).  Prolonged Mechanical Ventilation due to the above + agitation / encephalopathy Trach cuff leak Improving. Tolerating ATC all day   P:  Cont to push ATC...  Lasix.for neg fluid balance  Continue to titrate sedation to RASS 0. SLP eval---> can start PMV trials   CARDIOVASCULAR  A:  Hypervolemia> improving P:  Goal net even to slightly negative. Monitor QTc.  RENAL  A:  Stage III CKD Non gap metabolic acidosis, (resolved) Hypernatremia -->a little worse 11/4   P:  Lasix 40 mg to stay at BID for now Continue free water Chem daily Replace electrolytes as indicated.  GASTROINTESTINAL  A:  Severe protein calorie malnutrition. ETOH with cirrhosis? Vs hepatitis, improving  P:  Continue Tube feeds. H2 blockade. Follow LFT. Lactulose 30 mg PO TID. Cont Rifaximin 220 mg PO TID.  If able to stay of the vent will order swallow evaluation, if not then will need a PEG towards the end of the week (hopefully we can avoid given ascites).   HEMATOLOGIC   A:  Anemia from chronic GI  bleeding >> stable at present. Hepatic coagulopathy Bloody tracheal secretions  P: Two doses of Vit K (start 11/3) Repeat coags and CBC 11/5  INFECTIOUS  A:  Right > left PNA. HCAP vs aspiration.  Flu negative. S/p cefepime 7d ending 06/14/14 Fevers 10/24 (infection vs medication induced?) Trach site cellulitis  10/23 urine>>>neg 10/23 blood>>>neg 10/23 cdiff>>>neg 10/29 bc x 2>> 10/29uc>> neg 10/29 sputum>>normal flora  10/30 c.diff > neg  P:  Ceftaz 10/24>>> 10/27 Vanc 10/25>>>10/27  Vanco 10/30 >>>  Continue vancomycin for trach site cellulitis Wound care for trach site, will complete 7 days   ENDOCRINE  A:  DMII with nephropathy. P:  SSI Continue low dose lantus  NEUROLOGIC  A:  Acute encephalopathy 2nd to delirium tremens, respiratory failure, meds, critical illness.   P:  - RASS goal 0 - Continue precedex gtt, will increase Clonazepam to 3mg  tid and decrease Precedex ceiling to 1.2, hold here and try to decrease again 11/5 - Continue clonazepam. - Risperdal 2mg  BID, continue to monitor QT-c daily - Continue fentanyl patch @ 2750mcg/hr - prn fentanyl pushes - 10/26 added Depakote  - Foot drop boots - PT consult  - continue Lactulose and rifaximin - get him OOB/ try to establish normal day/night routine.   Plan of care was discussed at length with the family at bedside w/ the RN present. All questions answered 11/4  Anders SimmondsPete Babcock ACNP-BC [facility] Pul[facility]/Critical Care Pager # [phone] OR # [phone] if no answer  Attending Note: Will maintain on precedex for now, goal is to liberate from the ventilator completely then send to inpatient rehab.  Attempting to decrease precedex dose as able and will continue current management.  CC time 35 min.  Patient seen and examined, agree with above note.  I dictated the care and orders written for this patient under my direction.  Alyson ReedyWesam G Yacoub, MD [phone]

## 2014-07-01 NOTE — Progress Notes (Signed)
ANTIBIOTIC CONSULT NOTE - FOLLOW UP  Pharmacy Consult for Vancomycin Indication: Cellulitis  No Known Allergies  Patient Measurements: Height: 5\' 10"  (177.8 cm) Weight: 188 lb 4.4 oz (85.4 kg) IBW/kg (Calculated) : 73  Vital Signs: Temp: 99.3 F (37.4 C) (11/04 0400) Temp Source: Oral (11/04 0400) BP: 131/49 mmHg (11/04 1009) Pulse Rate: 78 (11/04 1000) Intake/Output from previous day: 11/03 0701 - 11/04 0700 In: 4755.5 [I.V.:1525.5; NG/GT:2080; IV Piggyback:500] Out: 4525 [Urine:4525] Intake/Output from this shift: Total I/O In: 257.4 [I.V.:107.4; NG/GT:150] Out: 320 [Urine:320]  Labs:  Recent Labs  06/29/14 0334 06/30/14 0325 07/01/14 0532  WBC  --  18.1*  --   HGB  --  7.3*  --   PLT  --  203  --   CREATININE 0.77 0.80 0.78   Estimated Creatinine Clearance: 131.8 mL/min (by C-G formula based on Cr of 0.78). No results for input(s): VANCOTROUGH, VANCOPEAK, VANCORANDOM, GENTTROUGH, GENTPEAK, GENTRANDOM, TOBRATROUGH, TOBRAPEAK, TOBRARND, AMIKACINPEAK, AMIKACINTROU, AMIKACIN in the last 72 hours.   Microbiology: Recent Results (from the past 720 hour(s))  MRSA PCR Screening     Status: Abnormal   Collection Time: 06/05/14  6:45 PM  Result Value Ref Range Status   MRSA by PCR POSITIVE (A) NEGATIVE Final    Comment:        The GeneXpert MRSA Assay (FDA approved for NASAL specimens only), is one component of a comprehensive MRSA colonization surveillance program. It is not intended to diagnose MRSA infection nor to guide or monitor treatment for MRSA infections. RESULT CALLED TO, READ BACK BY AND VERIFIED WITH: C.AYERS,RN AT 2213 ON 06/05/14 BY SHEAW  Culture, expectorated sputum-assessment     Status: None   Collection Time: 06/07/14 10:01 PM  Result Value Ref Range Status   Specimen Description SPUTUM  Final   Special Requests NONE  Final   Sputum evaluation   Final    MICROSCOPIC FINDINGS SUGGEST THAT THIS SPECIMEN IS NOT REPRESENTATIVE OF LOWER  RESPIRATORY SECRETIONS. PLEASE RECOLLECT.   Report Status 06/07/2014 FINAL  Final  Culture, bal-quantitative     Status: None   Collection Time: 06/09/14 10:25 AM  Result Value Ref Range Status   Specimen Description BRONCHIAL ALVEOLAR LAVAGE  Final   Special Requests NONE  Final   Gram Stain   Final    RARE WBC PRESENT, PREDOMINANTLY MONONUCLEAR NO SQUAMOUS EPITHELIAL CELLS SEEN NO ORGANISMS SEEN Performed at [facility]   Colony Count NO GROWTH [facility]formed at [facility]  Final   Culture   Final    NO GROWTH 2 DAYS Performed at [facility]   Report Status 06/11/2014 FINAL  Final  Body fluid culture     Status: None   Collection Time: 06/09/14 10:34 AM  Result Value Ref Range Status   Specimen Description THORACENTESIS PLEURAL  Final   Special Requests NONE  Final   Gram Stain   Final    NO WBC SEEN NO ORGANISMS SEEN Performed at [facility]   Culture   Final    NO GROWTH 3 DAYS Performed at [facility]   Report Status 06/12/2014 FINAL  Final  Culture, respiratory (NON-Expectorated)     Status: None   Collection Time: 06/10/14  8:30 AM  Result Value Ref Range Status   Specimen Description TRACHEAL ASPIRATE  Final   Special Requests NONE  Final   Gram Stain   Final    MODERATE WBC PRESENT,BOTH PMN AND MONONUCLEAR NO SQUAMOUS EPITHELIAL CELLS SEEN  NO ORGANISMS SEEN Performed at [facility]   Culture   Final    Non-Pathogenic Oropharyngeal-type Flora Isolated. Performed at [facility]   Report Status 06/12/2014 FINAL  Final  Culture, blood (routine x 2)     Status: None   Collection Time: 06/19/14 11:54 AM  Result Value Ref Range Status   Specimen Description BLOOD RIGHT ARM  Final   Special Requests BOTTLES DRAWN AEROBIC AND ANAEROBIC 10CC  Final   Culture  Setup Time   Final    06/19/2014 14:51 Performed at [facility]   Culture   Final    NO GROWTH 5 DAYS Performed at [facility]    Report Status 06/25/2014 FINAL  Final  Culture, blood (routine x 2)     Status: None   Collection Time: 06/19/14 12:00 PM  Result Value Ref Range Status   Specimen Description BLOOD LEFT ARM  Final   Special Requests BOTTLES DRAWN AEROBIC AND ANAEROBIC 10CC  Final   Culture  Setup Time   Final    06/19/2014 14:51 Performed at [facility]   Culture   Final    NO GROWTH 5 DAYS Performed at [facility]   Report Status 06/25/2014 FINAL  Final  Urine culture     Status: None   Collection Time: 06/19/14 12:15 PM  Result Value Ref Range Status   Specimen Description URINE, CATHETERIZED  Final   Special Requests Normal  Final   Culture  Setup Time   Final    06/19/2014 15:13 Performed at [facility]   Colony Count NO GROWTH Performed at [facility]  Final   Culture NO GROWTH Performed at [facility]  Final   Report Status 06/20/2014 FINAL  Final  Clostridium Difficile by PCR     Status: None   Collection Time: 06/19/14  2:28 PM  Result Value Ref Range Status   C difficile by pcr NEGATIVE NEGATIVE Final    Comment: [facility]  Culture, respiratory (NON-Expectorated)     Status: None   Collection Time: 06/25/14 11:49 AM  Result Value Ref Range Status   Specimen Description TRACHEAL ASPIRATE  Final   Special Requests Normal  Final   Gram Stain   Final    RARE WBC PRESENT,BOTH PMN AND MONONUCLEAR NO SQUAMOUS EPITHELIAL CELLS SEEN RARE GRAM POSITIVE COCCI IN PAIRS IN CLUSTERS Performed at [facility]    Culture   Final    Non-Pathogenic Oropharyngeal-type Flora Isolated. Performed at [facility]    Report Status 06/28/2014 FINAL  Final  Culture, blood (routine x 2)     Status: None   Collection Time: 06/25/14 11:50 AM  Result Value Ref Range Status   Specimen Description BLOOD RIGHT ARM  Final   Special Requests BOTTLES DRAWN AEROBIC ONLY 5CC  Final   Culture  Setup Time   Final    06/25/2014  14:33 Performed at [facility]    Culture   Final    NO GROWTH 5 DAYS Performed at [facility]    Report Status 07/01/2014 FINAL  Final  Culture, blood (routine x 2)     Status: None   Collection Time: 06/25/14 11:56 AM  Result Value Ref Range Status   Specimen Description BLOOD LEFT HAND  Final   Special Requests BOTTLES DRAWN AEROBIC ONLY 5CC  Final   Culture  Setup Time   Final    06/25/2014  14:32 Performed at [facility]    Culture   Final    NO GROWTH 5 DAYS Performed at [facility]    Report Status 07/01/2014 FINAL  Final  Culture, Urine     Status: None   Collection Time: 06/25/14  1:12 PM  Result Value Ref Range Status   Specimen Description URINE, CATHETERIZED  Final   Special Requests Normal  Final   Culture  Setup Time   Final    06/26/2014 00:21 Performed at [facility]   Colony Count NO GROWTH Performed at [facility]  Final   Culture NO GROWTH Performed at [facility]  Final   Report Status 06/27/2014 FINAL  Final  Clostridium Difficile by PCR     Status: None   Collection Time: 06/26/14 12:09 PM  Result Value Ref Range Status   C difficile by pcr NEGATIVE NEGATIVE Final    Comment: Performed at [facility]    Assessment: 38 yo M admitted 10/9 with progressive respiratory failure in setting of EtOH withdrawal and hepatic encephalopathy. He was treated for HCAP with empiric antibiotics - completed course on 10/18. Febrile to 102.6 on 10/24 - empiric antibiotic therapy was resumed with ceftazidime 2 grams IV q8h, but remained febrile on 10/25 and pharmacy was consulted to resume vancomycin dosing. All antibiotics dc'd 10/27. Pharmacy reconsulted 10/30 to resume vancomycin dosing for possible cellulitis at trach site.  10/11 >> levofloxacin >> 10/12 10/12 >> vanc >> 10/16 10/12 >> cefepime >> 10/18 10/17 >> rifaximin (HE) >> 10/26 10/24 >> ceftazidime >> 10/27 10/25 >> vancomycin >>  10/27, restart 10/30 >>  Tmax: 100.4 WBC: elevated, worse this am Renal: SCr wnl, CrCl > 100 ml/min   10/9 MRSA PCR: positive 10/12 influenza panel: neg 10/13 BAL cx: NGF 10/13 thoracentesis pleural cx: NGF 10/14 trach aspirate: oral flora 10/23 CDiff: neg 10/23 urine: NGF 10/23 blood x 2: NGF 10/29 trach asp: nonpathogenic flora 10/29 urine: NGF 10/29 blood x 2: ngtd 10/30 C. Difficile: negative  Drug level / dose changes info: 10/13 1700 VT: <5 on 1g q8h, incr to 1250mg  q8h 10/15 1700 VT: 8.6 on 1250mg  q8h increase to 1750mg  Q8H 10/27 0130 VT =12.6 mg/L On 1500mg  q8h (VT before 6th dose, includes 2g loading dose), increase to 1750 q8 == 11/1 VT at 1130 = 16.7 on 1500mg  q8h before 7th dose, decrease 1250mg  q8h  Goal of Therapy:  Vancomycin trough level 10-15 mcg/ml  Plan:  Day #6 vancomycin  Continue vancomycin to 1250mg  IV q8h   Hold off on repeating vanco trough as notes state to complete 7-days of therapy (will complete 11/5)  Juliette Alcide, PharmD, BCPS.   Pager: [phone] 07/01/2014,11:34 AM

## 2014-07-01 NOTE — Progress Notes (Signed)
RT removed PT from vent at 0805 and placed on 40% ATC. PT tolerating at this time- RN aware. Current Sp02 94%, RR 28, HR 98, BP 126/47 (RN aware).

## 2014-07-01 NOTE — Evaluation (Addendum)
Physical Therapy Evaluation Patient Details Name: Dion BodyWilliam Hooley MRN: [id number] DOB: 03/26/1976 Today's Date: 07/01/2014   History of Present Illness  38 yo male admitted 10/9 with acute blood loss anemia, respiratory failure, ETOH WD. Intubated 10/12. Trach 10/22. Hx of ETOH abuse, cirrhosis, anemia, HTN, DM.   Clinical Impression  On eval, pt required Mod assist +2 for mobility-able to stand and take a few lateral steps with RW. Unable to attempt ambulation due to not having correct tubing to use trach with portable tank. Assisted pt back to bed and onto bed pan. Made RN aware of the need for suctioning and that pt was on bedpan. Recommend CIR consult.     Follow Up Recommendations CIR; LTACH    Equipment Recommendations   (to be determined)    Recommendations for Other Services OT consult;Rehab consult     Precautions / Restrictions Precautions Precautions: Fall Precaution Comments: trach Restrictions Weight Bearing Restrictions: No      Mobility  Bed Mobility Overal bed mobility: Needs Assistance Bed Mobility: Supine to Sit;Sit to Supine     Supine to sit: Mod assist;+2 for physical assistance;+2 for safety/equipment;HOB elevated Sit to supine: Mod assist;+2 for physical assistance;+2 for safety/equipment   General bed mobility comments: Assist for trunk and bil LEs. Increased time. Multimodal cues for safety, technique.   Transfers Overall transfer level: Needs assistance Equipment used: Rolling walker (2 wheeled) Transfers: Sit to/from Stand Sit to Stand: Mod assist;+2 physical assistance;+2 safety/equipment;From elevated surface         General transfer comment: Assist to rise, stabilize, control descent. Mulitimodal cues for safety, technique, hand placement  Ambulation/Gait Ambulation/Gait assistance: Mod assist;+2 physical assistance;+2 safety/equipment           General Gait Details: [facility] to [facility] with [facility]W. Assist to stabilize and manage  lines  Stairs            Wheelchair Mobility    Modified Rankin (Stroke Patients Only)       Balance Overall balance assessment: Needs assistance Sitting-balance support: Bilateral upper extremity supported;Feet supported Sitting balance-Leahy Scale: Fair     Standing balance support: Bilateral upper extremity supported;During functional activity Standing balance-Leahy Scale: Poor                               Pertinent Vitals/Pain Pain Assessment: No/denies pain    Home Living                   Additional Comments: unable to get info from pt-on trach    Prior Function           Comments: unable to get info from pt-on trach     Hand Dominance        Extremity/Trunk Assessment   Upper Extremity Assessment: Generalized weakness           Lower Extremity Assessment: Generalized weakness      Cervical / Trunk Assessment: Kyphotic  Communication   Communication: Tracheostomy  Cognition Arousal/Alertness: Awake/alert Behavior During Therapy: WFL for tasks assessed/performed Overall Cognitive Status: Difficult to assess                      General Comments      Exercises        Assessment/Plan    PT Assessment Patient needs continued PT services  PT Diagnosis Difficulty walking;Generalized weakness   PT Problem List Decreased strength;Decreased activity tolerance;Decreased balance;Decreased  mobility;Decreased coordination;Decreased knowledge of use of DME;Decreased safety awareness;Cardiopulmonary status limiting activity  PT Treatment Interventions DME instruction;Gait training;Functional mobility training;Therapeutic activities;Therapeutic exercise;Patient/family education;Balance training   PT Goals (Current goals can be found in the Care Plan section) Acute Rehab PT Goals Patient Stated Goal: none stated PT Goal Formulation: Patient unable to participate in goal setting Time For Goal Achievement:  07/15/14 Potential to Achieve Goals: Good    Frequency Min 3X/week   Barriers to discharge        Co-evaluation               End of Session Equipment Utilized During Treatment: Oxygen Activity Tolerance: Patient limited by fatigue Patient left: in bed;with call bell/phone within reach;with nursing/sitter in room (called nursing in to suction trach)           Time: [id number] PT Time Calculation (min): 23 min   Charges:   PT Evaluation $Initial PT Evaluation Tier I: 1 Procedure PT Treatments $Therapeutic Activity: 23-37 mins   PT G Codes:          Rebeca AlertJannie Saleem Coccia, MPT Pager: [phone]

## 2014-07-02 LAB — GLUCOSE, CAPILLARY
GLUCOSE-CAPILLARY: 109 mg/dL — AB (ref 70–99)
GLUCOSE-CAPILLARY: 103 mg/dL — AB (ref 70–99)
GLUCOSE-CAPILLARY: 116 mg/dL — AB (ref 70–99)
Glucose-Capillary: 104 mg/dL — ABNORMAL HIGH (ref 70–99)
Glucose-Capillary: 132 mg/dL — ABNORMAL HIGH (ref 70–99)
Glucose-Capillary: 134 mg/dL — ABNORMAL HIGH (ref 70–99)
Glucose-Capillary: 144 mg/dL — ABNORMAL HIGH (ref 70–99)

## 2014-07-02 LAB — CBC
HEMATOCRIT: 24.5 % — AB (ref 39.0–52.0)
Hemoglobin: 7.3 g/dL — ABNORMAL LOW (ref 13.0–17.0)
MCH: 25.7 pg — ABNORMAL LOW (ref 26.0–34.0)
MCHC: 29.8 g/dL — ABNORMAL LOW (ref 30.0–36.0)
MCV: 86.3 fL (ref 78.0–100.0)
Platelets: 212 10*3/uL (ref 150–400)
RBC: 2.84 MIL/uL — ABNORMAL LOW (ref 4.22–5.81)
RDW: 24.5 % — ABNORMAL HIGH (ref 11.5–15.5)
WBC: 14.2 10*3/uL — AB (ref 4.0–10.5)

## 2014-07-02 LAB — PROTIME-INR
INR: 1.29 (ref 0.00–1.49)
PROTHROMBIN TIME: 16.3 s — AB (ref 11.6–15.2)

## 2014-07-02 LAB — COMPREHENSIVE METABOLIC PANEL
ALK PHOS: 195 U/L — AB (ref 39–117)
ALT: 42 U/L (ref 0–53)
AST: 76 U/L — ABNORMAL HIGH (ref 0–37)
Albumin: 2.4 g/dL — ABNORMAL LOW (ref 3.5–5.2)
Anion gap: 12 (ref 5–15)
BILIRUBIN TOTAL: 1.5 mg/dL — AB (ref 0.3–1.2)
BUN: 23 mg/dL (ref 6–23)
CHLORIDE: 116 meq/L — AB (ref 96–112)
CO2: 24 meq/L (ref 19–32)
CREATININE: 0.83 mg/dL (ref 0.50–1.35)
Calcium: 9.1 mg/dL (ref 8.4–10.5)
GFR calc Af Amer: >90 mL/min (ref >90)
Glucose, Bld: 118 mg/dL — ABNORMAL HIGH (ref 70–99)
POTASSIUM: 4.2 meq/L (ref 3.7–5.3)
Sodium: 152 mEq/L — ABNORMAL HIGH (ref 137–147)
Total Protein: 6.9 g/dL (ref 6.0–8.3)

## 2014-07-02 MED ORDER — METOPROLOL TARTRATE 25 MG/10 ML ORAL SUSPENSION
25.0000 mg | Freq: Two times a day (BID) | ORAL | Status: DC
  Filled 2014-07-02 (×2): qty 10

## 2014-07-02 MED ORDER — METOPROLOL TARTRATE 25 MG PO TABS
25.0000 mg | ORAL_TABLET | Freq: Two times a day (BID) | ORAL | Status: DC
  Administered 2014-07-02 – 2014-07-04 (×4): 25 mg via NASOGASTRIC
  Filled 2014-07-02 (×5): qty 1

## 2014-07-02 NOTE — Progress Notes (Signed)
Called to pt bedside by RN.  Pt noted for increased wob, rr30s on 40% atc.  Pt states he is getting tired.  Pt was placed back on vent at previous settings, prvc mode, vt 580, rr16, 40%, +5peep.  RN aware, at bedside.

## 2014-07-02 NOTE — Plan of Care (Signed)
Problem: ICU Phase Progression Outcomes Goal: Dyspnea controlled at rest Outcome: Progressing  Problem: Phase I Progression Outcomes Goal: Dyspnea controlled at rest Outcome: Progressing Goal: Hemodynamically stable Outcome: Progressing Goal: Pain controlled Outcome: Progressing

## 2014-07-02 NOTE — Progress Notes (Signed)
Rehab Admissions Coordinator Note:  Patient was screened by Trish MageLogue, Antonetta Clanton M for appropriateness for an Inpatient Acute Rehab Consult. Once patient is off vent and is medically more stable, could consider inpatient rehab consult.  Will also need an OT consult since patient has [employer] insurance and will need authorization for inpatient rehab if that becomes the venue of care desired.  Trish MageLogue, Caylie Sandquist M 07/02/2014, 9:43 AM  I can be reached at [phone].

## 2014-07-02 NOTE — Progress Notes (Signed)
PULMONARY / CRITICAL CARE MEDICINE  Name: Jerome BodyWilliam [name] MRN: [id number] DOB:  10/28/1975 ADMISSION DATE: 06/05/2014  CONSULTATION DATE: 06/08/2014  REFERRING MD : TRH  CHIEF COMPLAINT: Respiratory Failure     INITIAL PRESENTATION:  38 yo male with hx of ETOH presented with dyspnea/fatigue from profound anemia (Hb 4.5), and received multiple transfusions of PRBC.  Developed progressive respiratory failure with Rt > Lt pulmonary infiltrates, and PCCM assumed care in ICU.  Studies: 10/9 - There is hepatomegaly and splenomegaly consistent with parenchymal<BR>liver disease  SIGNIFICANT EVENTS:  10/09 admitted; PRBC x3, GI consulted 10/10 PRBC x1 10/11 PRBC x1; new onset PNA on chest x ray 10/12: acute hypoxic respiratory failure overnight requiring BiPAP. Alcohol withdrawal requiring precedex gtt. Intubated for decreasing LOC and increasing 02 needs and worsening PNA on CXR.  10/13 bronchoscopy >> respiratory secretions from RLL, friable airways  - 10/13 Rt thoracentesis > 500 ml fluid: transudate  10/14 gastric residuals  - 10/14 Echo >> EF 55 to 60%, PAS 33 mmHg 10/16 acidosis ?from diprivan >> change to versed gtt  06/13/14:  Significant intermittent agitation.  On 45% fio2.hick tenacious ET tube secretions + .  on fent 150mcg and versed 6mg /h gtt. START LACTULOSE AND XIFAXAN 10/19 fio2 50 % and peep 12 on ARDS protocol 10/20 fio2 40% 8 peep. Slow progress 10/20 self extubation with urgent reintubation and fall off bed to floor 10/22 trached (df) 10/23 agitation remains an issue. 10/25 fever, abx started 10/26 sedated or agitated 10/27 agitation remains an issue. Tolerated t collar 4 hours. 10/28 increase t collar as tolerated. Adjustments in anxiety medications. 10/29 will seek LTAC 10/30 ATC one hour 10/31 ATC only 10 minutes, but PSV 15/5 for 12 hours 11/3: Changed trach to 6 XLT distal due to high cuff pressures required to get Vt on vent. Tolerated all day on ATC. Trying  to taper precedex from 3mg /hr down to 2 mg/hr 11/4: OOB, using walker. Still on precedex. Started PMV trials. Went back on vent before midnight   11/5: precedex off.   Intake/Output Summary (Last 24 hours) at 07/02/14 0818 Last data filed at 07/02/14 0700  Gross per 24 hour  Intake 3971.75 ml  Output   3830 ml  Net 141.75 ml    SUBJECTIVE/OVERNIGHT/INTERVAL HX No distress. Up in chair. Progressing   PHYSICAL EXAM:  Vital signs in last 24 hours: Temp:  [97.9 F (36.6 C)-98.7 F (37.1 C)] 98 F (36.7 C) (11/05 0755) Pulse Rate:  [69-101] 101 (11/05 0805) Resp:  [14-63] 23 (11/05 0805) BP: (91-138)/(37-65) 124/64 mmHg (11/05 0805) SpO2:  [90 %-100 %] 99 % (11/05 0805) FiO2 (%):  [40 %] 40 % (11/05 0805) Weight:  [84.7 kg (186 lb 11.7 oz)] 84.7 kg (186 lb 11.7 oz) (11/05 0500) Last BM Date: 07/02/14 VENT SETTINGS: Vent Mode:  [-] PRVC FiO2 (%):  [40 %] 40 % Set Rate:  [16 bmp] 16 bmp Vt Set:  [580 mL] 580 mL PEEP:  [5 cmH20] 5 cmH20 Plateau Pressure:  [13 cmH20-16 cmH20] 13 cmH20 I/O: Intake/Output      11/04 0701 - 11/05 0700 11/05 0701 - 11/06 0700   I.V. (mL/kg) 770.7 (9.1)    Other 0    NG/GT 2780    IV Piggyback 500    Total Intake(mL/kg) 4050.7 (47.8)    Urine (mL/kg/hr) 3830 (1.9)    Total Output 3830     Net +220.7          Stool Occurrence 4 x  PHYSICAL EXAMINATION:   Gen:no distress HEENT: NCAT, good phonation w/ PMV PULM: Rhonchi scattered, improve w/ cough CV: RRR, no mgr AB: distended, BS+, nontender Ext: edema Neuro: follows commands, nods head to questions  Lab Results: CBC  Recent Labs Lab 06/28/14 0350 06/30/14 0325 07/02/14 0335  HGB 7.4* 7.3* 7.3*  HCT 24.4* 24.9* 24.5*  WBC 13.8* 18.1* 14.2*  PLT 211 203 212    COAGULATION  Recent Labs Lab 07/02/14 0335  INR 1.29    CHEMISTRY  Recent Labs Lab 06/27/14 0540 06/28/14 0350 06/29/14 0334 06/30/14 0325 07/01/14 0532 07/02/14 0335  NA 147 149* 146 146 149* 152*   K 4.3 3.4* 3.6* 3.9 3.8 4.2  CL 114* 116* 111 110 112 116*  CO2 21 22 22 23 23 24   GLUCOSE 155* 149* 148* 158* 151* 118*  BUN 21 19 17 19 20 23   CREATININE 0.65 0.69 0.77 0.80 0.78 0.83  CALCIUM 8.4 8.3* 8.1* 8.8 9.0 9.1  MG 1.7  --   --  1.6  --   --   PHOS 3.7  --   --  3.8  --   --    Estimated Creatinine Clearance: 127 mL/min (by C-G formula based on Cr of 0.83).   LIVER  Recent Labs Lab 07/02/14 0335  AST 76*  ALT 42  ALKPHOS 195*  BILITOT 1.5*  PROT 6.9  ALBUMIN 2.4*  INR 1.29     INFECTIOUS No results for input(s): LATICACIDVEN, PROCALCITON in the last 168 hours.   ENDOCRINE CBG (last 3)   Recent Labs  07/01/14 1936 07/01/14 2340 07/02/14 0347  GLUCAP 134* 144* 109*     IMAGING x48h Dg Chest Port 1 View  06/30/2014   CLINICAL DATA:  38 year old male with change in tracheostomy. Diabetic hypertensive patient. Subsequent encounter.  EXAM: PORTABLE CHEST - 1 VIEW  COMPARISON:  06/30/2014 4:55 a.m.  FINDINGS: Tracheostomy tube tip is midline 3 cm above the carina.  Tip of feeding tube and not well delineated secondary to technique.  Cardiomegaly.  Elevated right hemidiaphragm.  Pulmonary vascular congestion most notable centrally.  Limited evaluation of mediastinal structures.  IMPRESSION: Tracheostomy tube tip is midline 3 cm above the carina.  Tip of feeding tube and not well delineated secondary to technique.  Cardiomegaly.  Elevated right hemidiaphragm.  Pulmonary vascular congestion most notable centrally.   Electronically Signed   By: Bridgett Larsson M.D.   On: 06/30/2014 12:05  PCXR: edema/ bibasilar atx; persistent LUL nodule    ASSESSMENT / PLAN:  PULMONARY  OETT 10/12 >>10/20 self extubation, re intubated 10/20>>10/22, Trach #6 (DF)>>  A:  Acute on chronic hypoxic respiratory failure 2nd to PNA, and b/l pleural effusions (transudate on Rt).  Prolonged Mechanical Ventilation due to the above + agitation / encephalopathy Trach cuff  leak Improving. Tolerating ATC all day   P:  Cont to push ATC...  Lasix.for neg fluid balance  Continue to titrate sedation to RASS 0. SLP to direct PMV trials.   CARDIOVASCULAR  A:  Hypervolemia> improving P:  Goal net even to slightly negative. Monitor QTc.  RENAL  A:  Stage III CKD Non gap metabolic acidosis, (resolved) Hypernatremia -->a little worse 11/5   P:  Hold lasix 11/5 then resume at Lasix 40 mg daily Continue free water Chem daily Replace electrolytes as indicated.  GASTROINTESTINAL  A:  Severe protein calorie malnutrition. ETOH with cirrhosis? Vs hepatitis, improving  P:  Continue Tube feeds. H2 blockade. Follow LFT.  Lactulose 30 mg PO TID. Cont Rifaximin 220 mg PO TID.  If able to stay of the vent will order swallow evaluation, if not then will need a PEG towards the end of the week (hopefully we can avoid given ascites).   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 Hepatic coagulopathy--> INR improved w/ 2 doses vit K (11/3 and 11/4) Bloody tracheal secretions  P: Trend intermittent CBC and coags  INFECTIOUS  A:  Right > left PNA. HCAP vs aspiration.  Flu negative. S/p cefepime 7d ending 06/14/14 Fevers 10/24 (infection vs medication induced?) Trach site cellulitis  10/23 urine>>>neg 10/23 blood>>>neg 10/23 cdiff>>>neg 10/29 bc x 2>> neg  10/29uc>> neg 10/29 sputum>>normal flora  10/30 c.diff > neg  P:  Ceftaz 10/24>>> 10/27 Vanc 10/25>>>10/27  Vanco 10/30 >>>  Continue vancomycin for trach site cellulitis Wound care for trach site, will complete 7 days   ENDOCRINE  A:  DMII with nephropathy. P:  SSI Continue low dose lantus  NEUROLOGIC  A:  Acute encephalopathy 2nd to delirium tremens, respiratory failure, meds, critical illness.   P:  - RASS goal 0 - cont Clonazepam to 3mg  tid - f/u later--> worried how he will do off precedex  - Risperdal 2mg  BID, continue to monitor QT-c daily -  Continue fentanyl patch @ 550mcg/hr - prn fentanyl pushes - 10/26 added Depakote  - Foot drop boots - PT consulted - continue Lactulose and rifaximin - get him OOB/ try to establish normal day/night routine.   Plan of care was discussed at length with the family at bedside w/ the RN present. All questions answered   Anders SimmondsPete Babcock ACNP-BC [facility] Pager # [phone] OR # [phone] if no answer  Attending Note:  Remains needing precedex, not ready for weaning by evidence of last night.  Will hold in ICU.Marland Kitchen.  Patient seen and examined, agree with above note.  I dictated the care and orders written for this patient under my direction.  Alyson ReedyWesam G Yacoub, MD [phone]

## 2014-07-02 NOTE — Progress Notes (Signed)
Speech Language Pathology Treatment: Hillary BowPassy Muir Speaking valve  Patient Details Name: Dion BodyWilliam Bernet MRN: [id number] DOB: 12/21/1975 Today's Date: 07/02/2014 Time: [id number] SLP Time Calculation (min): 14 min  Assessment / Plan / Recommendation Clinical Impression  Pt off vent currently after being placed on vent last night at midnight per RN.  RN reports pt with productive cough and is able to expectorate secretions tracheally.   Parents Olegario MessierKathy and Gene in room with pt today and were educated to placement and removal of valve during session using return demonstration.  Pt benefited from max verbal/visual cues fading to moderate cue to focus on increasing phonatory strength by timing vocalization upon onset of exhalation.  He was mildly confused stating there was a blow dryer in the room while trying to pull at his catheter.  Mother present and informed pt to need to leave catheter alone.   Pt tolerated PMSV for approximately 9 minutes with first attempt, 6 minutes second attempt before having significant coughing episode without ability to expectorate secretions orally - likely due to viscosity of secretions.  Coughing episode second time appeared to fatigue pt and trials were discontinued at that time.    Will continue to follow pt to help maximize PMSV tolerance and phonatory strength.  Encouraged pt to continue to cough and expectorate secretions - and requested pt continue with full supervision for PMSV use for safety.  Provided written handout for pt/family and posted PMSV sign above pt's bed with precautions.     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 Patient was admitted about 3 months back from [facility] where he was getting detox from alcohol and was found to have low hemoglobin. Patient  received 2 unit PRBC , was seen by [facility] and underwent EGD which showed nonbleeding distal esophageal varices, gastritis and portal gastropathy. Patient was discharged home and was supposed to followup with [place] PCP in [place]. Patient reports seeing his PCP once after discharge but did not have any followup lab work . He reports that he was drinking every day  but has cut down to drinking every other day and drinks about 5-to 6 ounces of vodka . He reports having hand tremors on the days he doesn't drink. He informs feeling fatigued and tired for about 7 days. Also reports poor appetite. Developed progressive respiratory failure with Rt > Lt pulmonary infiltrates, and PCCM assumed care in ICU.  Required oral intubation 06/13/14,  self-extubated 10/20, trached 10/22, changed to XLT #6 cuffed shiley; trach collar 11/3, on vent at night; ATC 11/4.   Pertinent Vitals Pain Assessment: No/denies pain  SLP Plan       Recommendations        Patient may use Passy-Muir Speech Valve: Intermittently with supervision;During all therapies with supervision PMSV Supervision: Full            GO     Donavan Burnetamara Tamas Suen, MS [facility] SLP [phone]

## 2014-07-03 ENCOUNTER — Encounter (HOSPITAL_COMMUNITY): Payer: Self-pay | Admitting: Physical Medicine and Rehabilitation

## 2014-07-03 LAB — GLUCOSE, CAPILLARY
GLUCOSE-CAPILLARY: 121 mg/dL — AB (ref 70–99)
GLUCOSE-CAPILLARY: 102 mg/dL — AB (ref 70–99)
Glucose-Capillary: 138 mg/dL — ABNORMAL HIGH (ref 70–99)
Glucose-Capillary: 100 mg/dL — ABNORMAL HIGH (ref 70–99)
Glucose-Capillary: 112 mg/dL — ABNORMAL HIGH (ref 70–99)
Glucose-Capillary: 138 mg/dL — ABNORMAL HIGH (ref 70–99)
Glucose-Capillary: 130 mg/dL — ABNORMAL HIGH (ref 70–99)

## 2014-07-03 LAB — COMPREHENSIVE METABOLIC PANEL
ALBUMIN: 2.6 g/dL — AB (ref 3.5–5.2)
ALK PHOS: 224 U/L — AB (ref 39–117)
ALT: 49 U/L (ref 0–53)
ANION GAP: 13 (ref 5–15)
AST: 91 U/L — ABNORMAL HIGH (ref 0–37)
BUN: 18 mg/dL (ref 6–23)
CALCIUM: 8.8 mg/dL (ref 8.4–10.5)
CO2: 23 mEq/L (ref 19–32)
Chloride: 112 mEq/L (ref 96–112)
Creatinine, Ser: 0.79 mg/dL (ref 0.50–1.35)
GFR calc non Af Amer: >90 mL/min (ref >90)
GLUCOSE: 99 mg/dL (ref 70–99)
POTASSIUM: 3.5 meq/L — AB (ref 3.7–5.3)
Sodium: 148 mEq/L — ABNORMAL HIGH (ref 137–147)
TOTAL PROTEIN: 6.8 g/dL (ref 6.0–8.3)
Total Bilirubin: 1.7 mg/dL — ABNORMAL HIGH (ref 0.3–1.2)

## 2014-07-03 MED ORDER — POTASSIUM CHLORIDE 20 MEQ/15ML (10%) PO SOLN: 20.0000 meq | ORAL | Status: DC

## 2014-07-03 MED ORDER — POTASSIUM CHLORIDE CRYS ER 20 MEQ PO TBCR
20.0000 meq | EXTENDED_RELEASE_TABLET | Freq: Once | ORAL | Status: AC
  Administered 2014-07-03: 20 meq via ORAL
  Filled 2014-07-03: qty 1

## 2014-07-03 MED ORDER — GLUCERNA SHAKE PO LIQD
237.0000 mL | ORAL | Status: DC
  Administered 2014-07-04: 237 mL via ORAL
  Filled 2014-07-03: qty 237

## 2014-07-03 NOTE — Progress Notes (Signed)
SLP spoke to [employer] ResourcesN Alex re: pt's tolerance of lunch tray.  She reports good tolerance but pt requiring reminders to slow rate of intake.  Recommend continue full supervision with po for maximal airway protection.  SLP to follow up next week, if pt develops difficulties over the weekend, please order MBS as indicated.    Donavan Burnetamara Rogerio Boutelle, MS [facility] SLP [phone]

## 2014-07-03 NOTE — Plan of Care (Signed)
Problem: Phase I Progression Outcomes Goal: Pain controlled Outcome: Completed/Met Date Met:  07/03/14

## 2014-07-03 NOTE — Progress Notes (Signed)
PULMONARY / CRITICAL CARE MEDICINE  Name: Jerome [name] MRN: [id number] DOB:  10/28/1975 ADMISSION DATE: 06/05/2014  CONSULTATION DATE: 06/08/2014  REFERRING MD : TRH  CHIEF COMPLAINT: Respiratory Failure     INITIAL PRESENTATION:  38 yo male with hx of ETOH presented with dyspnea/fatigue from profound anemia (Hb 4.5), and received multiple transfusions of PRBC.  Developed progressive respiratory failure with Rt > Lt pulmonary infiltrates, and PCCM assumed care in ICU.  Studies: 10/9 - There is hepatomegaly and splenomegaly consistent with parenchymal<BR>liver disease  SIGNIFICANT EVENTS:  10/09 admitted; PRBC x3, GI consulted 10/10 PRBC x1 10/11 PRBC x1; new onset PNA on chest x ray 10/12: acute hypoxic respiratory failure overnight requiring BiPAP. Alcohol withdrawal requiring precedex gtt. Intubated for decreasing LOC and increasing 02 needs and worsening PNA on CXR.  10/13 bronchoscopy >> respiratory secretions from RLL, friable airways  - 10/13 Rt thoracentesis > 500 ml fluid: transudate  10/14 gastric residuals  - 10/14 Echo >> EF 55 to 60%, PAS 33 mmHg 10/16 acidosis ?from diprivan >> change to versed gtt  06/13/14:  Significant intermittent agitation.  On 45% fio2.hick tenacious ET tube secretions + .  on fent 150mcg and versed 6mg /h gtt. START LACTULOSE AND XIFAXAN 10/19 fio2 50 % and peep 12 on ARDS protocol 10/20 fio2 40% 8 peep. Slow progress 10/20 self extubation with urgent reintubation and fall off bed to floor 10/22 trached (df) 10/23 agitation remains an issue. 10/25 fever, abx started 10/26 sedated or agitated 10/27 agitation remains an issue. Tolerated t collar 4 hours. 10/28 increase t collar as tolerated. Adjustments in anxiety medications. 10/29 will seek LTAC 10/30 ATC one hour 10/31 ATC only 10 minutes, but PSV 15/5 for 12 hours 11/3: Changed trach to 6 XLT distal due to high cuff pressures required to get Vt on vent. Tolerated all day on ATC. Trying  to taper precedex from 3mg /hr down to 2 mg/hr 11/4: OOB, using walker. Still on precedex. Started PMV trials. Went back on vent before midnight   11/5: precedex off.  11/6 trach capped 11/6 swallow eval>>  Intake/Output Summary (Last 24 hours) at 07/03/14 1017 Last data filed at 07/03/14 0800  Gross per 24 hour  Intake 2088.17 ml  Output   2050 ml  Net  38.17 ml    SUBJECTIVE/OVERNIGHT/INTERVAL HX No distress. Up in chair. Progressing well  PHYSICAL EXAM:  Vital signs in last 24 hours: Temp:  [98.8 F (37.1 C)-99.2 F (37.3 C)] 98.9 F (37.2 C) (11/06 0800) Pulse Rate:  [104-133] 116 (11/06 0832) Resp:  [11-34] 23 (11/06 0832) BP: (113-157)/(38-80) 149/63 mmHg (11/06 0832) SpO2:  [85 %-100 %] 95 % (11/06 0832) FiO2 (%):  [40 %] 40 % (11/06 0832) Weight:  [189 lb 9.5 oz (86 kg)] 189 lb 9.5 oz (86 kg) (11/06 0422) Last BM Date: 07/03/14 VENT SETTINGS: Vent Mode:  [-]  FiO2 (%):  [40 %] 40 % I/O: Intake/Output      11/05 0701 - 11/06 0700 11/06 0701 - 11/07 0700   I.V. (mL/kg) 240 (2.8) 10 (0.1)   NG/GT 1533.2 0   IV Piggyback 500    Total Intake(mL/kg) 2273.2 (26.4) 10 (0.1)   Urine (mL/kg/hr) 1950 (0.9) 350 (1.2)   Total Output 1950 350   Net +323.2 -340          PHYSICAL EXAMINATION:   Gen:no distress, up and walking with walker HEENT: NCAT, good phonation w/ PMV PULM: Rhonchi scattered, improve w/ cough CV: RRR, no mgr AB:  distended, BS+, nontender Ext: edema Neuro: follows command, looks great, interactive, voice clear via pmv.  Lab Results: CBC  Recent Labs Lab 06/28/14 0350 06/30/14 0325 07/02/14 0335  HGB 7.4* 7.3* 7.3*  HCT 24.4* 24.9* 24.5*  WBC 13.8* 18.1* 14.2*  PLT 211 203 212    COAGULATION  Recent Labs Lab 07/02/14 0335  INR 1.29   CHEMISTRY  Recent Labs Lab 06/27/14 0540  06/29/14 0334 06/30/14 0325 07/01/14 0532 07/02/14 0335 07/03/14 0403  NA 147  < > 146 146 149* 152* 148*  K 4.3  < > 3.6* 3.9 3.8 4.2 3.5*  CL  114*  < > 111 110 112 116* 112  CO2 21  < > 22 23 23 24 23   GLUCOSE 155*  < > 148* 158* 151* 118* 99  BUN 21  < > 17 19 20 23 18   CREATININE 0.65  < > 0.77 0.80 0.78 0.83 0.79  CALCIUM 8.4  < > 8.1* 8.8 9.0 9.1 8.8  MG 1.7  --   --  1.6  --   --   --   PHOS 3.7  --   --  3.8  --   --   --   < > = values in this interval not displayed. Estimated Creatinine Clearance: 131.8 mL/min (by C-G formula based on Cr of 0.79).  LIVER  Recent Labs Lab 07/02/14 0335 07/03/14 0403  AST 76* 91*  ALT 42 49  ALKPHOS 195* 224*  BILITOT 1.5* 1.7*  PROT 6.9 6.8  ALBUMIN 2.4* 2.6*  INR 1.29  --    INFECTIOUS No results for input(s): LATICACIDVEN, PROCALCITON in the last 168 hours.  ENDOCRINE CBG (last 3)   Recent Labs  07/03/14 0003 07/03/14 0411 07/03/14 0742  GLUCAP 138* 100* 112*   IMAGING x48h No results found.PCXR: edema/ bibasilar atx; persistent LUL nodule   ASSESSMENT / PLAN:  PULMONARY  OETT 10/12 >>10/20 self extubation, re intubated 10/20>>10/22, Trach #6 (DF)>>  A:  Acute on chronic hypoxic respiratory failure 2nd to PNA, and b/l pleural effusions (transudate on Rt).  Prolonged Mechanical Ventilation due to the above + agitation / encephalopathy Trach cuff leak Improving. Tolerating ATC all day   P:  Cont to push ATC... 11/6 24 off vent. Try capping trach 11/6 Lasix.for neg fluid balance  SLP to direct PMV trials and diet. If continues to do ok overnight will change to cuffless 6 shiley in AM.   CARDIOVASCULAR  A:  Hypervolemia> improving P:  Goal net even to slightly negative. Monitor QTc.  RENAL   Recent Labs Lab 07/01/14 0532 07/02/14 0335 07/03/14 0403  NA 149* 152* 148*    A:  Stage III CKD Non gap metabolic acidosis, (resolved) Hypernatremia -->better 11/6   P:  Hold lasix 11/5 then resume at Lasix 40 mg daily Continue free water Chem daily Replace electrolytes as indicated.  GASTROINTESTINAL  A:  Severe protein calorie  malnutrition. ETOH with cirrhosis? Vs hepatitis, improving  P:  Continue Tube feeds. H2 blockade. Follow LFT. Lactulose 30 mg PO TID. Cont Rifaximin 220 mg PO TID.  Passed swallow evaluation, will begin diet per speech recommendations.  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 Hepatic coagulopathy--> INR improved w/ 2 doses vit K (11/3 and 11/4) Bloody tracheal secretions  P: Trend intermittent CBC and coags  INFECTIOUS  A:  Right > left PNA. HCAP vs aspiration.  Flu negative. S/p cefepime 7d ending 06/14/14 Fevers  10/24 (infection vs medication induced?) Trach site cellulitis  10/23 urine>>>neg 10/23 blood>>>neg 10/23 cdiff>>>neg 10/29 bc x 2>> neg  10/29uc>> neg 10/29 sputum>>normal flora  10/30 c.diff > neg  P:  Ceftaz 10/24>>> 10/27 Vanc 10/25>>>10/27  Vanco 10/30 >>>11/5  Continue vancomycin for trach site cellulitis Wound care for trach site, will complete 7 days   ENDOCRINE  CBG (last 3)   Recent Labs  07/03/14 0003 07/03/14 0411 07/03/14 0742  GLUCAP 138* 100* 112*     A:  DMII with nephropathy. P:  SSI Continue low dose lantus  NEUROLOGIC  A:  Acute encephalopathy 2nd to delirium tremens, respiratory failure, meds, critical illness.  Improving. Off precedex P:  - RASS goal 0 - cont Clonazepam to 3mg  tid - f/u later--> worried how he will do off precedex  - Risperdal 2mg  BID, continue to monitor QT-c daily - Continue fentanyl patch @ 72mcg/hr - prn fentanyl pushes - 10/26 added Depakote  - Foot drop boots - PT consulted - continue Lactulose and rifaximin - get him OOB/ try to establish normal day/night routine.   Plan of care was discussed at length with the family at bedside w/ the RN present. All questions answered 11/6  [facility] Minor ACNP Adolph Pollack PCCM Pager [phone] till 3 pm If no answer page [phone] 07/03/2014, 10:18 AM  Patient seen and examined, agree with above note.  I dictated the care  and orders written for this patient under my direction.  Alyson Reedy, MD [phone]

## 2014-07-03 NOTE — Evaluation (Signed)
Clinical/Bedside Swallow Evaluation Patient Details  Name: Jerome [name] MRN: [id number] Date of Birth: 05/22/1976  Today's Date: 07/03/2014 Time: 1010-1054 SLP Time Calculation (min): 44 min  Past Medical History:  Past Medical History  Diagnosis Date  . Diabetes mellitus without complication   . Hypertension    Past Surgical History:  Past Surgical History  Procedure Laterality Date  . Esophagogastroduodenoscopy N/A 03/20/2014    Procedure: ESOPHAGOGASTRODUODENOSCOPY (EGD);  Surgeon: Shirley FriarVincent C. 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 Patient was admitted about 3 months back from [facility] where he was getting detox from alcohol and was found to have low hemoglobin. Patient received 2 unit PRBC , was seen by [facility] and underwent EGD which showed nonbleeding distal esophageal varices, gastritis and portal gastropathy. Patient was discharged home and was supposed to followup with his PCP in [place]ology [place]. Patient reports seeing his PCP once after discharge but did not have any followup lab work . He reports that he was drinking every day  but has cut down to drinking every other day and drinks about 5-to 6 ounces of vodka . He reports having hand tremors on the days he doesn't drink. He informs feeling fatigued and tired for about 7 days. Also reports poor appetite. Developed progressive respiratory failure with Rt > Lt pulmonary infiltrates, and PCCM assumed care in ICU.  Required oral intubation 06/13/14,  self-extubated 10/20, trached 10/22, changed to XLT #6 cuffed shiley; trach collar 11/3, on vent at night; ATC 11/4.   Assessment / Plan / Recommendation Clinical Impression  Pt without CN  deficits and was tested with PMSV in place.  Swallow was timely & laryngeal elevation appeared adequate palpated at bedside.  Multiple swallows noted across boluses - suspect piecemealing and compensatory.  Pt denies sensation of pharyngeal stasis or dysphagia.  Suspect pt did aspirate cracker bolus when consuming large bite - pt admits to occurrence.  Intermittent cough noted with and without intake which pt and family states has been occuring this am before po.  Using teach back, SLP reiterated importance of aspiration precautions to pt/family using teach back.    Can not rule out aspiration at bedside given pt coughing with and without intake (likely secretion related)  and therefore options include initiating diet and assessing tolerance or progressing with instrumental evaluation.  Spoke to Dr Molli KnockYacoub who desired diet to be started and SLP follow up to determine tolerance/indication for further testing.      Recommend strict precautions, will followfor po tolerance.      Aspiration Risk  Mild    Diet Recommendation Dysphagia 3 (Mechanical Soft);Thin liquid   Liquid Administration via: Cup;Straw Medication Administration: Whole meds with puree Supervision: Patient able to self feed;Full supervision/cueing for compensatory strategies Compensations: Slow rate;Small sips/bites (cease intake and allow rest breaks if pt dyspenic) Postural Changes and/or Swallow Maneuvers: Seated upright 90 degrees;Upright 30-60 min after meal    Other  Recommendations Oral Care Recommendations: Oral care BID   Follow Up Recommendations  Inpatient Rehab    Frequency and Duration min 3x week  2 weeks   Pertinent Vitals/Pain Afebrile, decreased     Swallow Study Prior Functional Status   see HHX  General Date of Onset: 07/03/14 HPI: 38 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 Patient was admitted about 3 months back from [facility] where he was getting detox from alcohol and was found to have low hemoglobin. Patient received 2 unit PRBC , was seen by [facility] and underwent EGD which showed nonbleeding distal esophageal varices, gastritis and portal gastropathy. Patient was discharged home and was supposed to followup with his PCP in [place]ology in  [place]. Patient reports seeing his PCP once after discharge but did not have any followup lab work . He reports that he was drinking every day  but has cut down to drinking every other day and drinks about 5-to 6 ounces of vodka . He reports having hand tremors on the days he doesn't drink. He informs feeling fatigued and tired for about 7 days. Also reports poor appetite. Developed progressive respiratory failure with Rt > Lt pulmonary infiltrates, and PCCM assumed care in ICU.  Required oral intubation 06/13/14,  self-extubated 10/20, trached 10/22, changed to XLT #6 cuffed shiley; trach collar 11/3, on vent at night; ATC 11/4. Type of Study: Bedside swallow evaluation Diet Prior to this Study: NPO;Panda (pt had panda but removed it over night) Temperature Spikes Noted: No Respiratory Status: Trach collar Trach Size and Type: #6;Extra long;With PMSV in place History of Recent Intubation: Yes Length of Intubations (days):  (pt has trach, been off vent even over night for 2 days) Behavior/Cognition: Alert;Cooperative;Pleasant mood Oral Cavity - Dentition: Adequate natural dentition Self-Feeding Abilities: Able to feed self Patient Positioning: Upright in chair Baseline Vocal Quality: Clear Volitional Cough: Strong Volitional Swallow: Able to elicit    Oral/Motor/Sensory Function Overall Oral Motor/Sensory Function: Appears within functional limits for tasks assessed   Ice Chips Ice chips: Within functional limits Presentation: Self Fed;Spoon   Thin Liquid  Thin Liquid: Within functional limits Presentation: Cup;Self Fed;Straw;Spoon Other Comments: multiple swallows     Nectar Thick Nectar Thick Liquid: Not tested   Honey Thick Honey Thick Liquid: Not tested   Puree Puree: Within functional limits Presentation: Self Fed;Spoon Other Comments: multiple swallows   Solid   GO    Solid: Impaired Presentation: Self Fed Pharyngeal Phase Impairments: Cough - Immediate Other Comments: excessive strong coughing episode after intake of solid - suspect aspiration due to large bolus size       Donavan Burnetamara Krista Som, MS [facility] SLP [phone]

## 2014-07-03 NOTE — Progress Notes (Signed)
Rehab admissions - Evaluated for possible admission.  Please see rehab MD consult done today by Dr. Riley KillSwartz recommending SNF placement.  Lacks social supports that he would need for a successful inpatient rehab stay.  Call me for questions.  [phone]

## 2014-07-03 NOTE — Progress Notes (Signed)
Nutrition Note  Pt advanced to Dys3 diet on 11/06. TF discontinued. Pt reported consuming 75% of meals. Denied any nausea, abd pain or difficulty tolerating diet texture. Encouraged intake of snack or supplement to assist in meeting nutrient needs during recovery.   With family encouragement, pt agreed upon Glucerna shake once daily at 10AM. RD ordered to begin 11/07.  Lloyd HugerSarah F Brasen Bundren MS RD LDN Clinical Dietitian [phone]

## 2014-07-03 NOTE — Progress Notes (Signed)
[facility]ACEMENT PROTOCOL FOR AM LAB REPLACEMENT ONLY  The patient does apply for the [facility] Adult [facility] Electrolyte Replacment Protocol based on the criteria listed below:   1. Is GFR >/= 40 ml/min? Yes.    Patient's GFR today is >90 2. Is urine output >/= 0.5 ml/kg/hr for the last 6 hours? Yes.   Patient's UOP is 1.4 ml/kg/hr 3. Is BUN < 60 mg/dL? Yes.    Patient's BUN today is 18 4. Abnormal electrolyte(s)K3.5 5. Ordered repletion with: 6320meq/tube 6. If a panic level lab has been reported, has the CCM MD in charge been notified? Yes.  .   Physician:  B Gabriel CirriMcQuaid,MD  Jerome [name] Karry 07/03/2014 6:34 AM

## 2014-07-03 NOTE — Consult Note (Signed)
Physical Medicine and Rehabilitation Consult  Reason for Consult: Deconditioning due to anemia with hepatic encephalopathy and VDRF. Referring Physician: Dr. Molli KnockYacoub   HPI: Jerome [name] is a 38 y.o. male admitted 06/05/14 for anemia with Hgb 4.5 in ED. He has had a couple weeks' worth of progressive fatigue and poor energy. He has history of alcohol-mediated cirrhosis and endoscopy in July showed non-bleeding esophageal varices as well as portal gastropathy. He has completed detox at [facility] but has continued to drink  4-5 vodkas every couple day since return to home. GI was consulted for input and felt anemia due to slow bleeding due to portal gastropathy. Dr. Dulce Sellarutlaw recommended transfusion prn and no need for endoscopic work up.  He was treated w/ transfusions (5 units total PRBCs), medical care and monitoring. Developed ETOH w/d on 10/12 w progressive resp distress and decrease in LOC on 10/12 requiring intubation.  CXR with PNA and patient underwent right thoracocentesis of > 500cc transudate. He continued to have significant agitation and was started on lactulose and xifaxan for hepatic encephalopathy. He had difficulty with vent wean due to agitation/anxiety and trached on 10/22. Tolerating vent wean and tolerated TC last night. PMSV trials initiated. He continue to have confusion. Started on dysphagia 3, thin liquids today. PT evaluation done and CIR recommended due to deconditioned state.     ROS   Past Medical History  Diagnosis Date  . Diabetes mellitus without complication   . Hypertension   . Cirrhosis, alcoholic   . Gastropathy   . Esophageal varices   . Alcohol abuse     Past Surgical History  Procedure Laterality Date  . Esophagogastroduodenoscopy N/A 03/20/2014    Procedure: ESOPHAGOGASTRODUODENOSCOPY (EGD);  Surgeon: Shirley FriarVincent C. Schooler, MD;  Location[facility];  Service: Endoscopy;  Laterality: N/A;    History reviewed. No pertinent family  history.   Social History:   reports that he has never smoked. He has never used smokeless tobacco. He reports that he drinks alcohol. He reports that he does not use illicit drugs.   Allergies: No Known Allergies   Medications Prior to Admission  Medication Sig Dispense Refill  . albuterol (PROVENTIL HFA;VENTOLIN HFA) 108 (90 BASE) MCG/ACT inhaler Inhale 1-2 puffs into the lungs every 6 (six) hours as needed for wheezing or shortness of breath (shortness of breath).    Marland Kitchen. dexlansoprazole (DEXILANT) 60 MG capsule Take 60 mg by mouth daily.    . insulin glargine (LANTUS) 100 UNIT/ML injection Inject 15 Units into the skin daily with breakfast.    . LORazepam (ATIVAN) 1 MG tablet Take 1 mg by mouth 2 (two) times daily as needed for anxiety (anxiety).     Marland Kitchen. losartan (COZAAR) 50 MG tablet Take 50 mg by mouth daily.    . magnesium oxide (MAG-OX) 400 MG tablet Take 400 mg by mouth 2 (two) times daily.    . metoprolol tartrate (LOPRESSOR) 25 MG tablet Take 25 mg by mouth 2 (two) times daily.      Home: Home Living Family/patient expects to be discharged to:: Private residence Living Arrangements: Alone Additional Comments: unable to get info from pt-on trach  Functional History: Prior Function Comments: unable to get info from pt-on trach Functional Status:  Mobility: Bed Mobility Overal bed mobility: Needs Assistance Bed Mobility: Supine to Sit, Sit to Supine Supine to sit: Mod assist, +2 for physical assistance, +2 for safety/equipment, HOB elevated Sit to supine: Mod assist, +2 for physical assistance, +2  for safety/equipment General bed mobility comments: pt OOB in recliner.  Transfers Overall transfer level: Needs assistance Equipment used: Rolling walker (2 wheeled) Transfers: Sit to/from Stand Sit to Stand: Min assist, +2 physical assistance, +2 safety/equipment General transfer comment: Assist to rise, stabilize, control descent. Mulitimodal cues for safety, technique, hand  placement Ambulation/Gait Ambulation/Gait assistance: Min assist, +2 physical assistance Ambulation Distance (Feet): 75 Feet (75'x1, 50'x1) Assistive device: Rolling walker (2 wheeled) Gait Pattern/deviations: Step-through pattern, Ataxic, Decreased stride length General Gait Details: Unsteady. Assist to support/stabilize pt and to maneuver with walker. Followed closely with recliner. 1 seated rest break needed after 1st 50'. O2 sats remained in mid-high 90s%    ADL:    Cognition: Cognition Overall Cognitive Status: Difficult to assess Orientation Level: Oriented to person Cognition Arousal/Alertness: Awake/alert Behavior During Therapy: WFL for tasks assessed/performed Overall Cognitive Status: Difficult to assess Difficult to assess due to: Tracheostomy  Blood pressure 159/58, pulse 116, temperature 98.9 F (37.2 C), temperature source Oral, resp. rate 27, height 5\' 10"  (1.778 m), weight 86 kg (189 lb 9.5 oz), SpO2 97 %. Physical Exam  Results for orders placed or performed during the hospital encounter of 06/05/14 (from the past 24 hour(s))  Glucose, capillary     Status: Abnormal   Collection Time: 07/02/14 12:37 PM  Result Value Ref Range   Glucose-Capillary 104 (H) 70 - 99 mg/dL  Glucose, capillary     Status: Abnormal   Collection Time: 07/02/14  4:25 PM  Result Value Ref Range   Glucose-Capillary 103 (H) 70 - 99 mg/dL  Glucose, capillary     Status: Abnormal   Collection Time: 07/02/14  8:13 PM  Result Value Ref Range   Glucose-Capillary 116 (H) 70 - 99 mg/dL  Glucose, capillary     Status: Abnormal   Collection Time: 07/03/14 12:03 AM  Result Value Ref Range   Glucose-Capillary 138 (H) 70 - 99 mg/dL  Comprehensive metabolic panel     Status: Abnormal   Collection Time: 07/03/14  4:03 AM  Result Value Ref Range   Sodium 148 (H) 137 - 147 mEq/L   Potassium 3.5 (L) 3.7 - 5.3 mEq/L   Chloride 112 96 - 112 mEq/L   CO2 23 19 - 32 mEq/L   Glucose, Bld 99 70 - 99  mg/dL   BUN 18 6 - 23 mg/dL   Creatinine, Ser 1.610.79 0.50 - 1.35 mg/dL   Calcium 8.8 8.4 - 09.610.5 mg/dL   Total Protein 6.8 6.0 - 8.3 g/dL   Albumin 2.6 (L) 3.5 - 5.2 g/dL   AST 91 (H) 0 - 37 U/L   ALT 49 0 - 53 U/L   Alkaline Phosphatase 224 (H) 39 - 117 U/L   Total Bilirubin 1.7 (H) 0.3 - 1.2 mg/dL   GFR calc non Af Amer >90 >90 mL/min   GFR calc Af Amer >90 >90 mL/min   Anion gap 13 5 - 15  Glucose, capillary     Status: Abnormal   Collection Time: 07/03/14  4:11 AM  Result Value Ref Range   Glucose-Capillary 100 (H) 70 - 99 mg/dL   Comment 1 Notify RN   Glucose, capillary     Status: Abnormal   Collection Time: 07/03/14  7:42 AM  Result Value Ref Range   Glucose-Capillary 112 (H) 70 - 99 mg/dL   No results found.  Assessment/Plan: Diagnosis: deconditioning/ etoh abuse 1. Does the need for close, 24 hr/day medical supervision in concert with the patient's rehab  needs make it unreasonable for this patient to be served in a less intensive setting? Potentially 2. Co-Morbidities requiring supervision/potential complications: htn, dm 3. Due to bladder management, bowel management, safety and pain management, does the patient require 24 hr/day rehab nursing? Potentially 4. Does the patient require coordinated care of a physician, rehab nurse, PT (1-2 hrs/day, 5 days/week) and OT (1-2 hrs/day, 5 days/week) to address physical and functional deficits in the context of the above medical diagnosis(es)? Potentially Addressing deficits in the following areas: balance, endurance, locomotion, strength, transferring, bowel/bladder control, bathing, dressing, feeding, grooming and toileting 5. Can the patient actively participate in an intensive therapy program of at least 3 hrs of therapy per day at least 5 days per week? Potentially 6. The potential for patient to make measurable gains while on inpatient rehab is fair 7. Anticipated functional outcomes upon discharge from inpatient rehab are n/a   with PT, n/a with OT, n/a with SLP. 8. Estimated rehab length of stay to reach the above functional goals is: n/a 9. Does the patient have adequate social supports to accommodate these discharge functional goals? No 10. Anticipated D/C setting: Other 11. Anticipated post D/C treatments: N/A 12. Overall Rehab/Functional Prognosis: fair  RECOMMENDATIONS: This patient's condition is appropriate for continued rehabilitative care in the following setting: SNF Patient has agreed to participate in recommended program. Potentially Note that insurance prior authorization may be required for reimbursement for recommended care.  Comment:    Ranelle Oyster, MD, [facility] Physical Medicine & Rehabilitation 07/03/2014     07/03/2014

## 2014-07-03 NOTE — Plan of Care (Signed)
Problem: Phase I Progression Outcomes Goal: Optimized method of communication. Outcome: Completed/Met Date Met:  07/03/14

## 2014-07-03 NOTE — Progress Notes (Signed)
[facility]. Last Insulin coverage was 1600. Last Lantus was 1000. CBG at [place] was 132. CBG now 110. Mittens re applied. DR. Kendrick FriesMcQuaid notified. Will leave out for now. Requested To increase  Dextrose infusion.

## 2014-07-03 NOTE — Progress Notes (Signed)
Physical Therapy Treatment Patient Details Name: Dion BodyWilliam Mikles MRN: [id number] DOB: 06/06/1976 Today's Date: 07/03/2014    History of Present Illness 38 yo male admitted 10/9 with acute blood loss anemia, respiratory failure, ETOH WD. Intubated 10/12. Trach 10/22. Hx of ETOH abuse, cirrhosis, anemia, HTN, DM.     PT Comments    Progressing with mobility. Pt tolerated activity well and is very motivated to progress. Feel pt would be a great CIR candidate once medically stable.   Follow Up Recommendations  CIR;LTACH     Equipment Recommendations  Rolling walker with 5" wheels    Recommendations for Other Services       Precautions / Restrictions Precautions Precautions: Fall Precaution Comments: trach, O2 Restrictions Weight Bearing Restrictions: No    Mobility  Bed Mobility               General bed mobility comments: pt OOB in recliner.   Transfers Overall transfer level: Needs assistance Equipment used: Rolling walker (2 wheeled) Transfers: Sit to/from Stand Sit to Stand: Min assist;+2 physical assistance;+2 safety/equipment         General transfer comment: Assist to rise, stabilize, control descent. Mulitimodal cues for safety, technique, hand placement  Ambulation/Gait Ambulation/Gait assistance: Min assist;+2 physical assistance Ambulation Distance (Feet): 75 Feet (75'x1, 50'x1) Assistive device: Rolling walker (2 wheeled) Gait Pattern/deviations: Step-through pattern;Ataxic;Decreased stride length     General Gait Details: Unsteady. Assist to support/stabilize pt and to maneuver with walker. Followed closely with recliner. 1 seated rest break needed after 1st 50'. O2 sats remained in mid-high 90s%   Stairs            Wheelchair Mobility    Modified Rankin (Stroke Patients Only)       Balance                                    Cognition Arousal/Alertness: Awake/alert Behavior During Therapy: WFL for tasks  assessed/performed Overall Cognitive Status: Difficult to assess                      Exercises      General Comments        Pertinent Vitals/Pain Pain Assessment: No/denies pain    Home Living                      Prior Function            PT Goals (current goals can now be found in the care plan section) Progress towards PT goals: Progressing toward goals    Frequency  Min 3X/week    PT Plan Current plan remains appropriate    Co-evaluation             End of Session Equipment Utilized During Treatment: Oxygen;Gait belt Activity Tolerance: Patient tolerated treatment well Patient left: in chair;with call bell/phone within reach;with family/visitor present     Time: 1000-1019 PT Time Calculation (min): 19 min  Charges:  $Gait Training: 8-22 mins                    G Codes:      Rebeca AlertJannie Nettye Flegal, MPT Pager: [phone]

## 2014-07-03 NOTE — Progress Notes (Addendum)
Tolerated Trach Collar all night without WOB noted. Spasmotic cough at times, however only small amt of tracheal or oral secretions, which he is able to cough up without suctioning. CBG 100 at 0400. No orders received for Increasing the D5 infusion. Calm only when someone is in the room with him. Attempts to remove trach, IV, mittens.  Not capable of following instructions.  Constant observation and vigilance this shift. He is severely  sleep deprived.

## 2014-07-04 ENCOUNTER — Inpatient Hospital Stay (HOSPITAL_COMMUNITY): Payer: BC Managed Care – PPO

## 2014-07-04 DIAGNOSIS — E119 Type 2 diabetes mellitus without complications: Secondary | ICD-10-CM

## 2014-07-04 LAB — GLUCOSE, CAPILLARY
GLUCOSE-CAPILLARY: 96 mg/dL (ref 70–99)
Glucose-Capillary: 93 mg/dL (ref 70–99)
Glucose-Capillary: 107 mg/dL — ABNORMAL HIGH (ref 70–99)
Glucose-Capillary: 120 mg/dL — ABNORMAL HIGH (ref 70–99)

## 2014-07-04 LAB — BASIC METABOLIC PANEL
Anion gap: 12 (ref 5–15)
BUN: 12 mg/dL (ref 6–23)
CHLORIDE: 112 meq/L (ref 96–112)
CO2: 23 mEq/L (ref 19–32)
Calcium: 8.7 mg/dL (ref 8.4–10.5)
Creatinine, Ser: 0.78 mg/dL (ref 0.50–1.35)
GFR calc non Af Amer: >90 mL/min (ref >90)
Glucose, Bld: 103 mg/dL — ABNORMAL HIGH (ref 70–99)
Potassium: 3.6 mEq/L — ABNORMAL LOW (ref 3.7–5.3)
Sodium: 147 mEq/L (ref 137–147)

## 2014-07-04 LAB — CBC
HCT: 23 % — ABNORMAL LOW (ref 39.0–52.0)
HEMOGLOBIN: 6.9 g/dL — AB (ref 13.0–17.0)
MCH: 26.1 pg (ref 26.0–34.0)
MCHC: 30 g/dL (ref 30.0–36.0)
MCV: 87.1 fL (ref 78.0–100.0)
Platelets: 207 10*3/uL (ref 150–400)
RBC: 2.64 MIL/uL — ABNORMAL LOW (ref 4.22–5.81)
RDW: 23.9 % — ABNORMAL HIGH (ref 11.5–15.5)
WBC: 10.6 10*3/uL — ABNORMAL HIGH (ref 4.0–10.5)

## 2014-07-04 MED ORDER — PHYTONADIONE 5 MG PO TABS
10.0000 mg | ORAL_TABLET | Freq: Every day | ORAL | Status: DC
  Administered 2014-07-04 – 2014-07-06 (×3): 10 mg via ORAL
  Filled 2014-07-04 (×3): qty 2

## 2014-07-04 MED ORDER — VALPROIC ACID 250 MG PO CAPS
250.0000 mg | ORAL_CAPSULE | Freq: Two times a day (BID) | ORAL | Status: DC
Start: 2014-07-04 — End: 2014-07-09
  Administered 2014-07-04 – 2014-07-09 (×10): 250 mg via ORAL
  Filled 2014-07-04 (×11): qty 1

## 2014-07-04 MED ORDER — FENTANYL CITRATE 0.05 MG/ML IJ SOLN: 25.0000 ug | INTRAMUSCULAR | Status: DC | PRN

## 2014-07-04 MED ORDER — POTASSIUM CHLORIDE 20 MEQ/15ML (10%) PO SOLN: 20.0000 meq | ORAL | Status: DC

## 2014-07-04 MED ORDER — POTASSIUM CHLORIDE CRYS ER 20 MEQ PO TBCR
40.0000 meq | EXTENDED_RELEASE_TABLET | Freq: Once | ORAL | Status: AC
  Administered 2014-07-04: 40 meq via ORAL
  Filled 2014-07-04: qty 2

## 2014-07-04 MED ORDER — CLONAZEPAM 1 MG PO TABS
1.0000 mg | ORAL_TABLET | Freq: Two times a day (BID) | ORAL | Status: DC
  Administered 2014-07-04 – 2014-07-08 (×8): 1 mg via ORAL
  Filled 2014-07-04 (×8): qty 1

## 2014-07-04 MED ORDER — METOPROLOL TARTRATE 25 MG PO TABS
25.0000 mg | ORAL_TABLET | Freq: Two times a day (BID) | ORAL | Status: DC
  Administered 2014-07-04 – 2014-07-09 (×10): 25 mg via ORAL
  Filled 2014-07-04 (×12): qty 1

## 2014-07-04 NOTE — Progress Notes (Signed)
PULMONARY / CRITICAL CARE MEDICINE  Name: Jerome [name] MRN: [id number] DOB:  10/28/1975 ADMISSION DATE: 06/05/2014  CONSULTATION DATE: 06/08/2014  REFERRING MD : TRH  CHIEF COMPLAINT: Respiratory Failure     INITIAL PRESENTATION:  38 yo male with hx of ETOH presented with dyspnea/fatigue from profound anemia (Hb 4.5), and received multiple transfusions of PRBC.  Developed progressive respiratory failure with Rt > Lt pulmonary infiltrates, and PCCM assumed care in ICU.   SIGNIFICANT EVENTS:  10/09 admitted; PRBC x3, GI consulted 10/09 US abd: There is hepatomegaly and splenomegaly consistent with parenchymal liver disease 10/10 PRBC x1 10/11 PRBC x1; new onset PNA on chest x ray 10/12: acute hypoxic respiratory failure overnight requiring BiPAP. Alcohol withdrawal requiring precedex gtt. Intubated for decreasing LOC and increasing 02 needs and worsening PNA on CXR.  10/13 bronchoscopy >> respiratory secretions from RLL, friable airways  - 10/13 Rt thoracentesis > 500 ml fluid: transudate  10/14 Echo >> EF 55 to 60%, PAS 33 mmHg 10/16 acidosis from diprivan >> change to versed gtt 10/17:  Significant intermittent agitation.  On 45% fio2.hick tenacious ET tube secretions + .  on fent 150mcg and versed 6mg /h gtt. START LACTULOSE AND XIFAXAN 10/19 fio2 50 % and peep 12 on ARDS protocol 10/20 fio2 40% 8 peep. Slow progress 10/20 self extubation with urgent reintubation and fall off bed to floor 10/20 CT head: NAD 10/22 trached (df) 10/23 agitation remains an issue. 10/25 fever, abx started 10/26 sedated or agitated 10/27 agitation remains an issue. Tolerated t collar 4 hours. 10/28 increase t collar as tolerated. Adjustments in anxiety medications. 10/29 will seek LTAC 10/30 ATC one hour 10/31 ATC only 10 minutes, but PSV 15/5 for 12 hours 11/3: Changed trach to 6 XLT distal due to high cuff pressures required to get Vt on vent. Tolerated all day on ATC. Trying to taper precedex  from 3mg /hr down to 2 mg/hr 11/4: OOB, using walker. Still on precedex. Started PMV trials. Went back on vent before midnight   11/5: precedex off.  11/6 trach capped 11/6 swallow eval: D III diet initiated 11/07 Cognition intact. Severe diarrhea. Lactulose DC'd. Remains off vent. Changed to SDU status    Intake/Output Summary (Last 24 hours) at 07/04/14 1156 Last data filed at 07/04/14 0700  Gross per 24 hour  Intake    762 ml  Output    575 ml  Net    187 ml    SUBJECTIVE/OVERNIGHT/INTERVAL HX Mild tachypnea. No complaints. RASS 0. CAM ICU negative. RN reports freq loose BMs  PHYSICAL EXAM:  Vital signs in last 24 hours: Temp:  [98.4 F (36.9 C)-98.9 F (37.2 C)] 98.9 F (37.2 C) (11/07 0350) Pulse Rate:  [87-113] 87 (11/07 0340) Resp:  [0-30] 16 (11/07 1000) BP: (94-147)/(42-109) 127/66 mmHg (11/07 1000) SpO2:  [88 %-100 %] 93 % (11/07 1000) FiO2 (%):  [28 %-40 %] 28 % (11/07 0900) Last BM Date: 07/03/14 VENT SETTINGS: Vent Mode:  [-]  FiO2 (%):  [28 %-40 %] 28 % I/O: Intake/Output      11/06 0701 - 11/07 0700 11/07 0701 - 11/08 0700   P.O. 699    I.V. (mL/kg) 103 (1.2)    NG/GT 0    IV Piggyback     Total Intake(mL/kg) 802 (9.3)    Urine (mL/kg/hr) 1275 (0.6)    Total Output 1275     Net -473          Urine Occurrence 6 x    Stool  Occurrence 6 x      PHYSICAL EXAMINATION:   Gen: NAD HEENT: WNL, trach site clean PULM: few rhonchi CV: Reg, no M AB: Soft, NT, +BS Ext: minimal edema Neuro: no focal deficits  Lab Results: I have reviewed all of today's lab results. Relevant abnormalities are discussed in the A/P section  CXR:  ASSESSMENT / PLAN:   PULMONARY  OETT 10/12 >>10/20 self extubation, 10/20>>10/22 Trach #6 (DF)>> A:  Acute on chronic hypoxic respiratory failure 2nd to PNA, and b/l pleural effusions (transudate on Rt).  Prolonged VDRF Trach status P:  Cont ATC Cont supp O2 Monitor CXR intermittently  CARDIOVASCULAR  A:   P:   Goal net even to slightly negative. Monitor QTc.  RENAL  A:  Non gap metabolic acidosis, (resolved) Hypernatremia, mild Hypervolemia, much improved P:  Monitor BMET intermittently Monitor I/Os Correct electrolytes as indicated Cont diuresis as needed to keep I/Os even to slightly neg  GASTROINTESTINAL  A:  Severe protein calorie malnutrition. Alcoholic cirrhosis Dysphagia, mild P:  Cont D II diet Monitor LFTs intermittently  HEMATOLOGIC  A:  Anemia from chronic GI bleeding  Hepatic coagulopathy P: DVT px: SCDs Monitor CBC intermittently Transfuse per usual ICU guidelines Monitor coags Scheduled PO vitamin K ordered 11/07  INFECTIOUS  A:  HCAP, NOS - treated Fevers, resolved Trach site cellulitis, resolved  10/23 urine>>>neg 10/23 blood>>>neg 10/23 cdiff>>>neg 10/29 bc x 2>> neg  10/29uc>> neg 10/29 sputum>>normal flora  10/30 c.diff > neg  P:  Ceftaz 10/24>>> 10/27 Vanc 10/25>>>10/27  Vanco 10/30 >>>11/5    ENDOCRINE  A:  DMII  Hyperglycemia, very well controlled P:  Cont SSI DC Lantus 11/07  NEUROLOGIC  A:  Acute encephalopathy, delirium tremens - much improved ICU associated discomfort Severe deconditioning P:  Cont current Rx CIR has evaluated - he is close to being ready for transfer  Pt, mother and father updated in detail Change to SDU status  Billy Fischeravid Nieves Chapa, MD ; [facility] service Mobile [phone].  After 5:30 PM or weekends, call [phone]

## 2014-07-04 NOTE — Progress Notes (Signed)
[facility] FOR [facility] LAB REPLACEMENT ONLY  The patient does apply [facility] the [facility] Protocol based on the criteria listed below:   1. Is GFR >/= 40 ml/min? Yes.    Patient's GFR today is >90 2. Is urine output >/= 0.5 ml/kg/hr for the last 6 hours? Yes.   Patient's UOP is 0.5 ml/kg/hr 3. Is BUN < 60 mg/dL? Yes.    Patient's BUN today is 12 4. Abnormal electrolyte(s): K3.6 5. Ordered repletion with:[id number] 6. If a panic level lab has been reported, has the CCM MD in charge been notified? Yes.  .   Physician:  Thea GistV Sood,MD  Anguel Delapena Ugochukwu 07/04/2014 6:26 AM

## 2014-07-04 NOTE — Progress Notes (Signed)
OT Cancellation Note  Patient Details Name: Jerome [name] MRN: [id number] DOB: 11/05/1975   Cancelled Treatment:    Reason Eval/Treat Not Completed: Medical issues which prohibited therapy (Hemoglobin 6.9)  Kathryn Linarez A 07/04/2014, 8:36 AM

## 2014-07-04 NOTE — Progress Notes (Signed)
CRITICAL VALUE ALERT  Critical value received:  HgB 6.9  Date of notification:  07/04/2014  Time of notification:  0439  Critical value read back:Yes.    Nurse who received alert:  Verl BlalockHans Johnson, RN and Valla LeaverBrianne Leo Weyandt, RN    MD notified (1st page):  Via Elink   Time of first page:  [phone]  MD notified (2nd page):  Time of second page:  Responding MD:  Soot   Time MD responded:  [phone]

## 2014-07-05 LAB — COMPREHENSIVE METABOLIC PANEL
ALT: 52 U/L (ref 0–53)
AST: 110 U/L — ABNORMAL HIGH (ref 0–37)
Albumin: 2.5 g/dL — ABNORMAL LOW (ref 3.5–5.2)
Alkaline Phosphatase: 204 U/L — ABNORMAL HIGH (ref 39–117)
Anion gap: 11 (ref 5–15)
BUN: 11 mg/dL (ref 6–23)
CALCIUM: 9 mg/dL (ref 8.4–10.5)
CHLORIDE: 105 meq/L (ref 96–112)
CO2: 23 meq/L (ref 19–32)
Creatinine, Ser: 0.76 mg/dL (ref 0.50–1.35)
GLUCOSE: 108 mg/dL — AB (ref 70–99)
Potassium: 4.1 mEq/L (ref 3.7–5.3)
SODIUM: 139 meq/L (ref 137–147)
Total Bilirubin: 1.4 mg/dL — ABNORMAL HIGH (ref 0.3–1.2)
Total Protein: 6.9 g/dL (ref 6.0–8.3)

## 2014-07-05 LAB — GLUCOSE, CAPILLARY
GLUCOSE-CAPILLARY: 92 mg/dL (ref 70–99)
GLUCOSE-CAPILLARY: 87 mg/dL (ref 70–99)
Glucose-Capillary: 101 mg/dL — ABNORMAL HIGH (ref 70–99)
Glucose-Capillary: 108 mg/dL — ABNORMAL HIGH (ref 70–99)
Glucose-Capillary: 127 mg/dL — ABNORMAL HIGH (ref 70–99)
Glucose-Capillary: 97 mg/dL (ref 70–99)

## 2014-07-05 LAB — CBC
HCT: 24.1 % — ABNORMAL LOW (ref 39.0–52.0)
HEMOGLOBIN: 7.2 g/dL — AB (ref 13.0–17.0)
MCH: 26 pg (ref 26.0–34.0)
MCHC: 29.9 g/dL — ABNORMAL LOW (ref 30.0–36.0)
MCV: 87 fL (ref 78.0–100.0)
Platelets: 231 10*3/uL (ref 150–400)
RBC: 2.77 MIL/uL — AB (ref 4.22–5.81)
RDW: 23.1 % — ABNORMAL HIGH (ref 11.5–15.5)
WBC: 9.5 10*3/uL (ref 4.0–10.5)

## 2014-07-05 LAB — PROTIME-INR
INR: 1.3 (ref 0.00–1.49)
PROTHROMBIN TIME: 16.4 s — AB (ref 11.6–15.2)

## 2014-07-05 LAB — APTT: aPTT: 44 seconds — ABNORMAL HIGH (ref 24–37)

## 2014-07-05 MED ORDER — RISPERIDONE 1 MG/ML PO SOLN
1.0000 mg | Freq: Two times a day (BID) | ORAL | Status: DC
  Administered 2014-07-05 – 2014-07-06 (×2): 1 mg via ORAL
  Filled 2014-07-05 (×5): qty 1

## 2014-07-05 MED ORDER — CLONIDINE HCL 0.2 MG PO TABS
0.2000 mg | ORAL_TABLET | Freq: Three times a day (TID) | ORAL | Status: DC
  Administered 2014-07-05 – 2014-07-08 (×8): 0.2 mg via ORAL
  Filled 2014-07-05 (×2): qty 1
  Filled 2014-07-05: qty 2
  Filled 2014-07-05: qty 1
  Filled 2014-07-05 (×5): qty 2
  Filled 2014-07-05 (×2): qty 1

## 2014-07-05 NOTE — Progress Notes (Signed)
PULMONARY / CRITICAL CARE MEDICINE  Name: Jerome BodyWilliam [name] MRN: [id number] DOB:  10/28/1975 ADMISSION DATE: 06/05/2014  CONSULTATION DATE: 06/08/2014  REFERRING MD : TRH  CHIEF COMPLAINT: Respiratory Failure     INITIAL PRESENTATION:  38 yo male with hx of ETOH presented with dyspnea/fatigue from profound anemia (Hb 4.5), and received multiple transfusions of PRBC.  Developed progressive respiratory failure with Rt > Lt pulmonary infiltrates, and PCCM assumed care in ICU.   SIGNIFICANT EVENTS:  10/09 admitted; PRBC x3, GI consulted 10/09 US abd: There is hepatomegaly and splenomegaly consistent with parenchymal liver disease 10/10 PRBC x1 10/11 PRBC x1; new onset PNA on chest x ray 10/12: acute hypoxic respiratory failure overnight requiring BiPAP. Alcohol withdrawal requiring precedex gtt. Intubated for decreasing LOC and increasing 02 needs and worsening PNA on CXR.  10/13 bronchoscopy >> respiratory secretions from RLL, friable airways  - 10/13 Rt thoracentesis > 500 ml fluid: transudate  10/14 Echo >> EF 55 to 60%, PAS 33 mmHg 10/16 acidosis from diprivan >> change to versed gtt 10/17:  Significant intermittent agitation.  On 45% fio2.hick tenacious ET tube secretions + .  on fent 150mcg and versed 6mg /h gtt. START LACTULOSE AND XIFAXAN 10/19 fio2 50 % and peep 12 on ARDS protocol 10/20 fio2 40% 8 peep. Slow progress 10/20 self extubation with urgent reintubation and fall off bed to floor 10/20 CT head: NAD 10/22 trached (df) 10/23 agitation remains an issue. 10/25 fever, abx started 10/26 sedated or agitated 10/27 agitation remains an issue. Tolerated t collar 4 hours. 10/28 increase t collar as tolerated. Adjustments in anxiety medications. 10/29 will seek LTAC 10/30 ATC one hour 10/31 ATC only 10 minutes, but PSV 15/5 for 12 hours 11/3: Changed trach to 6 XLT distal due to high cuff pressures required to get Vt on vent. Tolerated all day on ATC. Trying to taper precedex  from 3mg /hr down to 2 mg/hr 11/4: OOB, using walker. Still on precedex. Started PMV trials. Went back on vent before midnight   11/5: precedex off.  11/6 trach capped 11/6 swallow eval: D III diet initiated 11/07 Cognition intact. Severe diarrhea. Lactulose DC'd. Remains off vent. Changed to SDU status 11/08 Much improved. Somnolent. Risperdal dose reduced   Intake/Output Summary (Last 24 hours) at 07/05/14 1732 Last data filed at 07/05/14 1352  Gross per 24 hour  Intake    483 ml  Output   2075 ml  Net  -1592 ml    SUBJECTIVE/OVERNIGHT/INTERVAL HX Mild tachypnea. No complaints. RASS 0. CAM ICU negative. RN reports freq loose BMs  PHYSICAL EXAM:  Vital signs in last 24 hours: Temp:  [97.7 F (36.5 C)-98.8 F (37.1 C)] 97.9 F (36.6 C) (11/08 0730) Pulse Rate:  [87-96] 96 (11/08 1045) Resp:  [0-45] 15 (11/08 1600) BP: (92-146)/(40-79) 101/78 mmHg (11/08 1654) SpO2:  [90 %-100 %] 93 % (11/08 1640) FiO2 (%):  [28 %-30 %] 30 % (11/08 1640) Last BM Date: 07/04/14 VENT SETTINGS: Vent Mode:  [-]  FiO2 (%):  [28 %-30 %] 30 % I/O: Intake/Output      11/07 0701 - 11/08 0700 11/08 0701 - 11/09 0700   P.O.  480   I.V. (mL/kg)  3 (0)   Total Intake(mL/kg)  483 (5.6)   Urine (mL/kg/hr) 1250 (0.6) 1175 (1.3)   Total Output 1250 1175   Net -1250 -692          PHYSICAL EXAMINATION:   Gen: NAD HEENT: WNL, trach site clean PULM: few rhonchi CV:  Reg, no M AB: Soft, NT, +BS Ext: minimal edema Neuro: no focal deficits  Lab Results: I have reviewed all of today's lab results. Relevant abnormalities are discussed in the A/P section  CXR:  ASSESSMENT / PLAN:   PULMONARY  OETT 10/12 >>10/20 self extubation, 10/20>>10/22 Trach #6 (DF)>> A:  Acute on chronic hypoxic respiratory failure 2nd to PNA, and b/l pleural effusions (transudate on Rt).  Prolonged VDRF Trach status P:  Cont ATC Cont supp O2 Monitor CXR intermittently  CARDIOVASCULAR  A:  No acute issues P:   Monitor QTc on neuroleptics   RENAL  A:  Non gap metabolic acidosis, (resolved) Hypernatremia, resolved Hypervolemia, much improved P:  Monitor BMET intermittently Monitor I/Os Correct electrolytes as indicated Cont diuresis as needed to keep I/Os even to slightly neg  GASTROINTESTINAL  A:  Severe protein calorie malnutrition. Alcoholic cirrhosis Dysphagia, mild P:  Cont D II diet Monitor LFTs intermittently  HEMATOLOGIC  A:  Anemia from chronic GI bleeding  Hepatic coagulopathy P: DVT px: SCDs Monitor CBC intermittently Transfuse per usual ICU guidelines Monitor coags Scheduled PO vitamin K ordered 11/07  INFECTIOUS  A:  HCAP, NOS - treated Fevers, resolved Trach site cellulitis, resolved  10/23 urine>>>neg 10/23 blood>>>neg 10/23 cdiff>>>neg 10/29 bc x 2>> neg  10/29uc>> neg 10/29 sputum>>normal flora  10/30 c.diff > neg  P:  Ceftaz 10/24>>> 10/27 Vanc 10/25>>>10/27  Vanco 10/30 >>>11/5    ENDOCRINE  A:  DMII, diet controlled Hyperglycemia, very well controlled P:  DC SSI Resume for glu > 180  NEUROLOGIC  A:  Acute encephalopathy, much improved Delirium tremens, resolved ICU associated discomfort Severe deconditioning P:  Begin to taper sedating meds slowly - risperdal dose decreased 11/08 CIR has evaluated - he is close to being ready for transfer   Billy Fischeravid Simonds, MD ; [facility] service Mobile [phone].  After 5:30 PM or weekends, call [phone]

## 2014-07-06 LAB — GLUCOSE, CAPILLARY
GLUCOSE-CAPILLARY: 103 mg/dL — AB (ref 70–99)
GLUCOSE-CAPILLARY: 109 mg/dL — AB (ref 70–99)
Glucose-Capillary: 91 mg/dL (ref 70–99)
Glucose-Capillary: 119 mg/dL — ABNORMAL HIGH (ref 70–99)

## 2014-07-06 LAB — MRSA PCR SCREENING: MRSA by PCR: NEGATIVE

## 2014-07-06 MED ORDER — BOOST PLUS PO LIQD
237.0000 mL | Freq: Three times a day (TID) | ORAL | Status: DC
  Administered 2014-07-07 – 2014-07-09 (×8): 237 mL via ORAL
  Filled 2014-07-06 (×10): qty 237

## 2014-07-06 MED ORDER — RISPERIDONE 1 MG PO TABS
1.0000 mg | ORAL_TABLET | Freq: Two times a day (BID) | ORAL | Status: DC
  Administered 2014-07-06 – 2014-07-08 (×4): 1 mg via ORAL
  Filled 2014-07-06 (×5): qty 1

## 2014-07-06 MED ORDER — PHYTONADIONE 5 MG PO TABS
10.0000 mg | ORAL_TABLET | Freq: Every day | ORAL | Status: AC
  Administered 2014-07-07: 10 mg via ORAL
  Filled 2014-07-06: qty 2

## 2014-07-06 NOTE — Progress Notes (Signed)
Repeat MRSA swab by PCR negative.  Contact isolation D/C'd.

## 2014-07-06 NOTE — Progress Notes (Signed)
PULMONARY / CRITICAL CARE MEDICINE  Name: Jerome [name] MRN: [id number] DOB:  10/28/1975 ADMISSION DATE: 06/05/2014  CONSULTATION DATE: 06/08/2014  REFERRING MD : TRH  CHIEF COMPLAINT: Respiratory Failure     INITIAL PRESENTATION:  38 yo male with hx of ETOH presented with dyspnea/fatigue from profound anemia (Hb 4.5), and received multiple transfusions of PRBC.  Developed progressive respiratory failure with Rt > Lt pulmonary infiltrates, and PCCM assumed care in ICU.   SIGNIFICANT EVENTS:  10/09 admitted; PRBC x3, GI consulted 10/09 US abd: There is hepatomegaly and splenomegaly consistent with parenchymal liver disease 10/10 PRBC x1 10/11 PRBC x1; new onset PNA on chest x ray 10/12: acute hypoxic respiratory failure overnight requiring BiPAP. Alcohol withdrawal requiring precedex gtt. Intubated for decreasing LOC and increasing 02 needs and worsening PNA on CXR.  10/13 bronchoscopy >> respiratory secretions from RLL, friable airways  - 10/13 Rt thoracentesis > 500 ml fluid: transudate  10/14 Echo >> EF 55 to 60%, PAS 33 mmHg 10/16 acidosis from diprivan >> change to versed gtt 10/17:  Significant intermittent agitation.  On 45% fio2.hick tenacious ET tube secretions + .  on fent 150mcg and versed 6mg /h gtt. START LACTULOSE AND XIFAXAN 10/19 fio2 50 % and peep 12 on ARDS protocol 10/20 fio2 40% 8 peep. Slow progress 10/20 self extubation with urgent reintubation and fall off bed to floor 10/20 CT head: NAD 10/22 trached (df) 10/23 agitation remains an issue. 10/25 fever, abx started 10/26 sedated or agitated 10/27 agitation remains an issue. Tolerated t collar 4 hours. 10/28 increase t collar as tolerated. Adjustments in anxiety medications. 10/29 will seek LTAC 10/30 ATC one hour 10/31 ATC only 10 minutes, but PSV 15/5 for 12 hours 11/3: Changed trach to 6 XLT distal due to high cuff pressures required to get Vt on vent. Tolerated all day on ATC. Trying to taper precedex  from 3mg /hr down to 2 mg/hr 11/4: OOB, using walker. Still on precedex. Started PMV trials. Went back on vent before midnight   11/5: precedex off.  11/6 trach capped 11/6 swallow eval: D III diet initiated 11/07 Cognition intact. Severe diarrhea. Lactulose DC'd. Remains off vent. Changed to SDU status 11/08 Much improved. Somnolent. Risperdal dose reduced   Intake/Output Summary (Last 24 hours) at 07/06/14 1057 Last data filed at 07/06/14 1005  Gross per 24 hour  Intake    740 ml  Output   2950 ml  Net  -2210 ml    SUBJECTIVE/OVERNIGHT/INTERVAL HX  No complaints. RASS 0. CAM ICU negative. Off precedex over weekend Tolerates PM valve  PHYSICAL EXAM:  Vital signs in last 24 hours: Temp:  [97.8 F (36.6 C)-98.8 F (37.1 C)] 97.8 F (36.6 C) (11/09 0800) Pulse Rate:  [92-118] 118 (11/09 0903) Resp:  [0-25] 21 (11/09 1000) BP: (96-154)/(59-79) 154/79 mmHg (11/09 0903) SpO2:  [91 %-97 %] 92 % (11/09 1000) FiO2 (%):  [30 %] 30 % (11/08 1640) Last BM Date: 07/04/14 VENT SETTINGS: Vent Mode:  [-]  FiO2 (%):  [30 %] 30 % I/O: Intake/Output      11/08 0701 - 11/09 0700 11/09 0701 - 11/10 0700   P.O. 480 740   I.V. (mL/kg) 3 (0)    Total Intake(mL/kg) 483 (5.6) 740 (8.6)   Urine (mL/kg/hr) 2775 (1.3) 600 (1.8)   Total Output 2775 600   Net -2292 +140          PHYSICAL EXAMINATION:   Gen: NAD HEENT: WNL, trach site clean PULM: clear, BL decreased  CV: Reg, no M AB: Soft, NT, +BS Ext: minimal edema Neuro: no focal deficits  Lab Results: I have reviewed all of today's lab results. Relevant abnormalities are discussed in the A/P section  CXR:  ASSESSMENT / PLAN:   PULMONARY  OETT 10/12 >>10/20 self extubation, 10/20>>10/22 Trach #6 (DF)>> A:  Acute on chronic hypoxic respiratory failure 2nd to PNA, and b/l pleural effusions (transudate on Rt).  Prolonged VDRF Trach status P:  Cont ATC Cont supp O2 Monitor CXR intermittently  CARDIOVASCULAR  A:  No acute  issues P:  Monitor QTc on neuroleptics   RENAL  A:  Non gap metabolic acidosis, (resolved) Hypernatremia, resolved Hypervolemia, much improved P:  Monitor BMET intermittently Monitor I/Os Correct electrolytes as indicated Cont diuresis as needed to keep I/Os even to slightly neg  GASTROINTESTINAL  A:  Severe protein calorie malnutrition. Alcoholic cirrhosis Dysphagia, mild P:  Cont D II diet Monitor LFTs intermittently  HEMATOLOGIC  A:  Anemia from chronic GI bleeding  Hepatic coagulopathy P: DVT px: SCDs Monitor CBC intermittently Transfuse per usual ICU guidelines Monitor coags Scheduled PO vitamin K ordered 11/07 x 3 doses  INFECTIOUS  A:  HCAP, NOS - treated Fevers, resolved Trach site cellulitis, resolved  10/23 urine>>>neg 10/23 blood>>>neg 10/23 cdiff>>>neg 10/29 bc x 2>> neg  10/29uc>> neg 10/29 sputum>>normal flora  10/30 c.diff > neg  P:  Ceftaz 10/24>>> 10/27 Vanc 10/25>>>10/27  Vanco 10/30 >>>11/5    ENDOCRINE  A:  DMII, diet controlled Hyperglycemia, very well controlled P:  DC SSI Resume for glu > 180  NEUROLOGIC  A:  Acute encephalopathy, much improved Delirium tremens, resolved ICU associated discomfort Severe deconditioning P:  Begin to taper sedating meds slowly - risperdal dose decreased 11/08   Dispo - plan for SNF eventually vs home with PT  Oretha MilchALVA,Jerome Vivero V. MD

## 2014-07-06 NOTE — Procedures (Signed)
Tracheostomy Change Note  Patient Details:   Name: Jerome [name] DOB: 06/30/1976 MRN: [id number]    Airway Documentation:    Changed trach to #4 cuffless Shiley per MD order. Placement confirmed by ETC02 color change.   Evaluation  O2 sats: stable throughout Complications: No apparent complications Patient did tolerate procedure well. Bilateral Breath Sounds: Clear, Diminished Suctioning: Airway  Kendall FlackJackson, Louvina Cleary Royal 07/06/2014, 11:52 AM

## 2014-07-06 NOTE — Progress Notes (Deleted)
Clinical Social Work Department CLINICAL SOCIAL WORK PLACEMENT NOTE 07/06/2014  Patient:  Jerome [name],Jerome [name]  Account Number:  [id number] Admit date:  07/04/2014  Clinical Social Worker:  Jacelyn GripSUZANNA BYRD, LCSWA  Date/time:  07/06/2014 04:00 PM  Clinical Social Work is seeking post-discharge placement for this patient at the following level of care:   SKILLED NURSING   (*CSW will update this form in Epic as items are completed)   07/06/2014  [facility]/family provided with [facility] of Clinical Social Work's list of facilities offering this level of care within the geographic area requested by the patient (or if unable, by the patient's family).  07/06/2014  Patient/family informed of their freedom to choose among providers that offer the needed level of care, that participate in Medicare, Medicaid or managed care program needed by the patient, have an available bed and are willing to accept the patient.  07/06/2014  Patient/family informed of [facility]' ownership interest in [facility], as well as of the fact that they are under no obligation to receive care at this facility.  PASARR submitted to EDS on 07/06/2014 PASARR number received on 07/06/2014  FL2 transmitted to all facilities in geographic area requested by pt/family on  07/06/2014 FL2 transmitted to all facilities within larger geographic area on   Patient informed that his/her managed care company has contracts with or will negotiate with  certain facilities, including the following:     Patient/family informed of bed offers received:   Patient chooses bed at  Physician recommends and patient chooses bed at    Patient to be transferred to  on   Patient to be transferred to facility by  Patient and family notified of transfer on  Name of family member notified:    The following physician request were entered in Epic:   Additional Comments:   Loletta SpecterSuzanna Kidd, MSW, LCSW Clinical Social  Work [phone]

## 2014-07-06 NOTE — Plan of Care (Signed)
Problem: Phase II Progression Outcomes Goal: O2 sats > equal to 90% on RA or at baseline Outcome: Progressing Goal: ADLs completed with minimal assistance Outcome: Progressing Goal: Activity at appropriate level-compared to baseline (UP IN CHAIR FOR HEMODIALYSIS)  Outcome: Progressing Goal: Tolerating diet Outcome: Completed/Met Date Met:  07/06/14  Problem: Phase III Progression Outcomes Goal: Pain controlled with appropriate interventions Outcome: Completed/Met Date Met:  07/06/14 Goal: Hemodynamically stable Outcome: Completed/Met Date Met:  07/06/14 Goal: Nutritional plan in place Outcome: Completed/Met Date Met:  07/06/14 Goal: Changed to appropriate Path within 48 hrs extubation Outcome: Completed/Met Date Met:  07/06/14

## 2014-07-06 NOTE — Discharge Summary (Signed)
Physician Discharge Summary  Patient ID: Jerome [name] MRN: [id number] DOB/AGE: July 15, 1976 38 y.o.  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   Discharge Plan: -Follow up CBC, INR with PCP  -Monitor Hgb / stools as outpatient  -ETOH cessation imperative  -Continue Dexilant  -Continue Rifaximin for hepatic encephalopathy   Diabetes Mellitus II Hyperglycemia   Discharge Plan: -Hold outpatient lantus until seen by PCP, CBG's have been low to  normal prior to discharge  -pt instructed to record CBG's and take with him to see PCP  CBG (last 3)   Recent Labs  07/08/14 1723 07/09/14 0729 07/09/14 1139  GLUCAP 139* 128* 120*     Acute Hepatic Encephalopathy  Delirium  ICU Discomfort Severe Deconditioning   Discharge Plan: -Resume prior PRN ativan -Arranged for home PT, OT, RN & SLP   Hypertension   Discharge Plan: -Wean Clonidine to off (was utilized as adjunct to Precedex while in ICU) -Continue lopressor 25 mg BID  -hold home cozaar  -follow up with PCP with blood pressure review                DISCHARGE SUMMARY   Jerome [name] is a 38 y.o. y/o male with a PMH of HTN, DM, ETOH abuse with subsequent ETOH cirrhosis & esophageal varices who presented to the [facility] on 06/05/14 with approximately 2 weeks of progressive fatigue / decreased energy levels.  He was recently admitted in late July 2015 for GIB and anemia.  July endoscopy showed non-bleeding esophageal varices as well as portal gastropathy.  Prior to admission, the patient continued to drink alcohol.  Initial work up was notable for a Hgb of 4.5 on presentation.  He was admitted by the hospitalist service and treated with transfusions for anemia.  He received 5 units of PRBC's and monitoring.  US of the abdomen was assessed and demonstrated hepatomegaly and  splenomegaly consistent with parenchymal liver disease.  He was evaluated by GI in the setting of recurrent bleeding.    Unfortunately, on 10/12 he developed ETOH withdrawal with progressive respiratory distress.  He was noted to have an interval development of pneumonia on CXR and placed on NIPPV.  Pulmonary / Critical Care was consulted for evaluation.  He required intubation / mechanical ventilation secondary to decreasing LOC, increased O2 needs and worsening airspace disease on CXR.  Bronchoscopy was performed with culture from RLL. He also underwent thoracentesis 10/13 for R pleural effusion with  500 ml fluid removed, studies consistent with transudate.  He was treated with antibiotics as below for HCAP.  Cultures to date are negative.  His ICU course was complicated by significant agitated delirium.  He self extubated on 10/20 and fell out of bed.  He required emergent reintubation. CT of the head assessed and negative.  He underwent tracheostomy placement on 10/22 given his prolonged need for mechanical ventilation.  Delirium waxed and waned requiring frequent titration of Precedex (was on doses as high as 3 mcg/kg/hr) and multiple oral medications to attempt to control agitation. He was weaned from [place] on 11/5 and had slow improvement in mental status.  He was evaluated by SLP and diet progressed to a regular diet with thin liquids.  Physical therapy evaluated the patient and he was able to ambulate with a walker.  He was de-cannulated on 11/11 and ultimately, he was cleared for discharge home (with parents) with PT, OT, RN, SLP for continued rehab efforts.               SIGNIFICANT EVENTS:  10/09  admitted with anemia (Hgb 4.5), PRBC x3, GI consulted 10/09  [place] abd > hepatomegaly and splenomegaly consistent with parenchymal liver disease 10/10  PRBC x1 10/11  PRBC x1; new onset PNA on chest x ray 10/12  Acute hypoxic respiratory failure overnight requiring BiPAP. ETOH withdrawal requiring precedex gtt. Intubated for decreasing LOC and increasing 02 needs and worsening PNA on CXR.  10/13  bronchoscopy >> respiratory secretions from RLL, friable airways 10/13  Rt thoracentesis > 500 ml fluid: transudate  10/14  Echo >> EF 55 to 60%, PAS 33 mmHg 10/16  acidosis from diprivan >> change to versed gtt 10/17 Significant intermittent agitation. On 45% fio2 thick tenacious ET tube secretions + on fent 12mg and versed 645mh gtt. 10/07  START LACTULOSE AND XIFAXAN 10/19  fio2 50 % and peep 12 on ARDS protocol 10/20  fio2 40% 8 peep. Slow progress 10/20  self extubation with urgent  reintubation and fall off bed to floor 10/20  CT head: NAD 10/22  trached (Dr. FeTitus Mould10/23  agitation remains an issue. 10/25  fever, abx started 10/26  sedated or agitated 10/27  agitation remains an issue. Tolerated t collar 4 hours. 10/28  increase t collar as tolerated. Adjustments in anxiety medications. 10/29  will seek LTAC 10/30  ATC one hour 10/31  ATC only 10 minutes, but PSV 15/5 for 12 hours 11/03  Trach to 6 XLT distal due to high cuff pressures required to get Vt on vent. Tolerated all day on ATC. Trying to taper precedex from 64m57mr down to 2 mg/hr 11/04  OOB, using walker. Still on precedex. Started PMV trials. Went back on vent before midnight  11/05  precedex off.  11/06  trach capped 11/06  swallow eval: D III diet initiated 11/07  Cognition intact. Severe diarrhea. Lactulose DC'd. Remains off vent. Changed to  SDU status 11/08  Much improved. Somnolent. Risperdal dose reduced 11/09  Improving cognition, calm.  Trach changed to #4 cuffless  11/11  Decannulated, delirium regimen reduced    MICRO DATA  10/23 urine >> neg 10/23 blood >> neg 10/23 cdiff >> neg 10/29 bcx2 >> neg  10/29 uc >> neg 10/29 sputum >> normal flora  10/30 cdiff >> neg  ANTIBIOTICS Ceftaz 10/24 >> 10/27 Vanc 10/25 >>10/27  Vanco 10/30 >>11/5  CONSULTS GI - Dr. Paulita Fujita Inpatient Rehab - Dr. Naaman Plummer  TUBES / LINES OETT 10/12 >>10/20 self extubation, 10/20>>10/22 Lurline Idol #6 10/22 (DF) >> changed to #4 cuffless 11/9 >> 11/11 decannulated    Discharge Exam: General: well developed adult male in NAD Neuro: AAOx4, speech clear, MAE CV: s1s2 rrr, no m/r/g PULM: resp's even/non-labored, lungs bilaterally clear, stoma clean / dry GI: round/soft, bsx4 active  Extremities: warm/dry, no edema   Filed Vitals:   07/08/14 1924 07/08/14 2104 07/09/14 0532 07/09/14 0900  BP:  113/57 109/52   Pulse: 99 89 83   Temp:  98 F (36.7 C) 98 F (36.7 C)   TempSrc:  Oral Oral   Resp: 17  18 18    Height:      Weight:      SpO2: 95% 99% 93% 90%     Discharge Labs  BMET  Recent Labs Lab 07/03/14 0403 07/04/14 0350 07/05/14 0335 07/07/14 0513 07/08/14 0429  NA 148* 147 139 136* 138  K 3.5* 3.6* 4.1 3.8 3.8  CL 112 112 105 101 103  CO2 23 23 23 23 24   GLUCOSE 99 103* 108* 116* 126*  BUN 18 12 11 10 12   CREATININE 0.79 0.78 0.76 0.73 0.85  CALCIUM 8.8 8.7 9.0 9.3 9.5   CBC  Recent Labs Lab 07/05/14 0335 07/07/14 0513 07/08/14 0429  HGB 7.2* 7.6* 7.2*  HCT 24.1* 24.5* 23.8*  WBC 9.5 8.0 8.5  PLT 231 264 251   Anti-Coagulation  Recent Labs Lab 07/05/14 0335  INR 1.30        Medication List    STOP taking these medications        insulin glargine 100 UNIT/ML injection  Commonly known as:  LANTUS     losartan 50 MG tablet  Commonly known as:  COZAAR     magnesium oxide 400 MG tablet  Commonly known as:  MAG-OX      TAKE these medications        albuterol 108 (90 BASE) MCG/ACT inhaler  Commonly known as:  PROVENTIL HFA;VENTOLIN HFA  Inhale 1-2 puffs into the lungs every 6 (six) hours as needed for wheezing or shortness of breath (shortness of breath).     cloNIDine 0.1 MG tablet  Commonly known as:  CATAPRES  Take one tablet twice daily (am & pm) for 2 days, then one tablet daily for 2 days, then 1/2 tab daily for 2 days and stop     DEXILANT 60 MG capsule  Generic drug:  dexlansoprazole  Take 60 mg by mouth daily.     LORazepam 1 MG tablet  Commonly known as:  ATIVAN  Take 1 mg by mouth 2 (two) times daily as needed for anxiety (anxiety).     metoprolol tartrate 25 MG tablet  Commonly known as:  LOPRESSOR  Take 25 mg by mouth 2 (two) times daily.     rifaximin 200 MG tablet  Commonly known as:  XIFAXAN  Take 1 tablet (200 mg total) by mouth every 8 (  eight) hours.       Follow-up Information    Follow up with [facility], DO On 07/16/2014.   Specialty:  Family Medicine   Why:  Appt at 11:15 am.     Contact  information:   [address] [place] [address] [phone]         Disposition: Home with Parents.  Arranged for home PT, OT, RN & Aide  Discharged Condition: Jerome [name] has met maximum benefit of inpatient care and is medically stable and cleared for discharge.  Patient is pending follow up as above.      Time spent on disposition:  Greater than 35 minutes.   Signed: Noe Gens, NP-C [facility] Pgr: [phone] Office: [phone]    Physician Statement:  He can be weaned off clonidine, risperdal now that delerium is resolved & resume home dose of ativan. Tolerating decannulation well. The Patient was personally examined, the discharge assessment and plan has been personally reviewed and I agree with ACNP 's assessment and plan. > 30 minutes of time have been dedicated to discharge assessment, planning and discharge instructions.    Rigoberto Noel MD

## 2014-07-06 NOTE — Progress Notes (Signed)
NUTRITION FOLLOW UP  Intervention:     Dysphagia 3 diet with minced meat and thin liquids  Snacks as desired  Boost Plus tid  Encouraged intake  RD to follow.  Nutrition Dx:   Inadequate oral intake related to inability to eat as evidenced by npo status; ongoing -resolved  New diagnosis:   Increased nutrient needs related to medical status AEB protein needs >100 gm daily.  Goal:   Diet advancement. Patient to meet estimated needs with meals and supplements. -progressing  Monitor:   Intake of meals and supplements, tolerance of po, labs, weights, diet order, sedation  Assessment:   10/12:  NPO on c pap. Currently being intubated.  Ate 1/2 omelet Saturday. Mother reports that patient has been afraid of eating for some time for fear of vomiting.  Patient last seen by RD 03/20/14. Diagnosed with severe malnutrition at that time due to weight loss and poor intake.  Weight has increased from 190 lbs in July to 208 lbs currently. Probably fluid gain.  Intake has been poor prior to admit.  Patient is currently intubated on ventilator support  MV: 11.6 L/min  Temp (24hrs), Avg:99 F (37.2 C), Min:98.3 F (36.8 C), Max:99.7 F (37.6 C)   Propofol: 20 ml/hr   10/13: Patient remains intubated on ventilator support MV: 11.2 L/min Temp (24hrs), Avg:98.4 F (36.9 C), Min:97.8 F (36.6 C), Max:98.8 F (37.1 C)  Propofol: 36.8 ml/hr  Vital AF 1.2 initiated at 20 ml/hr on 10/13 with goal rate of 60 ml/hr. RD to order adult enteral protocol.  MD noted pt increasingly agitated on ventilator. Propofol level increased, providing approximately 972 kcal/day. Low K levels being repleted. Weight increased 8 lbs since admit, likely d/t fluid Pt s/p thoracentesis with 500 ml fluid removal  10/15: -Pt's tube feeding on hold d/t elevated gastric residuals.  -Vital AF 1.2 had been running at 25 ml/hr. On 10/14, residuals > 500. OGT clamped. Residuals 350 ml on 10/15, bile looking  appearance per RN -Per discussion with RN, pt's tube feed to be held for 24 hours before restarting. Gastric residuals likely related to sedation, and bowel function will likely improve with sedation weaning. RN noted only minimal bowel sounds -Propofol was decreased earlier this morning to 19.8 ml/hr for WUA; and then was increased back to 36.8 ml/hr  -MD recommended f/u abd xray -Continue with current TF recommendation to be reinitiated per MD discretion, will monitor propofol levels/possible weaning and adjust as warranted -Total bili elevated, likely d/t cirrhosis of liver and hx of ETOH -Weight increased from 40 lb from admit, likely d/t fluid as pt with +2 generalized edema and +5 L fluid balance  10/16: -Received consult to initiate TF. Per discussion with RN, MD requesting trickle feeds at this time to assess tolerance, and increase to goal rate when able -Pharm noted pt with elevated triglycerides (> 400). Per RN, propofol is d/c and Versed will be used for sedation -Phosphorus slightly low at 2.1, K/Mg WNL  10/19: -Pt with elevated ammonia, lactulose ordered TID.  -Assisted in improving constipation; however resulted in ongoing loose stools. Plan to decrease lactulose and add H2 blockade -Oxepa to continue as 20 ml/hr trickle feeds until loose stools episodes decrease. 60 ml residuals per RN  -Pt being diuresed, -2 L fluid balance, and only 4 lbs increased from admit weight. -Mg repleted, Phos/Mg/K all WNL  10/21: -Pt receiving Oxepa at 30 ml/hr, which provides 1080 kcal (57% est kcal needs), and 45 gram protein (66%  re-est protein needs) -Ordered Pro-Stat 30 ml once daily to provide 100 kcal, 15 gram protein -0ml residuals noted, continues with loose stools d/t lactulose. - Lactulose not decreased d/t continued hepatic encephalopathy. Modified protein requirements, and will increase as warranted -Phos/K//Mg WNL, glucose slightly elevated -Pt self extubated on 10/20, and was  reintubated. Plan for trach placement on 10/22  10/23: -Pt s/p trach placement on 10/22 -Continues to receive Oxepa at 30 ml/hr, 0 ml residuals -Continues with loose stools, rectal tube placed on 10/22. MD plan to decrease lactulose. Currently ordered TID  10/28: -Tolerating Oxepa at goal rate of 50 ml/hr with prostat 30 ml once daily, providing 1900 kcal (100% est kcal needs), and 90 gram protein (90% est protein needs) -Per MD, pt no longer requiring full ARDS protocol -Will modify to Vital AF 1.2 -Edema improving, net +1.5 L  11/03: -Vital AF 1.2 infusing at 50 ml/hr, did not advance d/t abd distension -Tolerating w/out residuals. Receiving Pro-Stat 30 ml BID -Per discussion with RN, pt has been having multiple episodes of loose stools last night and yesterday; however has not had one yet today. Abd continues to be distended, but tender. Will trial advancement of Vital AF 1.2 to 55 ml/hr and assess tolerance -On 10/31, RN noted stage 2 pressure ulcer on ear and shoulder. Will increase estimated protein needs. -Pt with improvement in edema, none noted.  -Receiving 300 ml free water flushes 6 times daily to assist with hypernatremia, provides additional 1800 ml free water. TF will provide 1070 ml free water; equalling total of 2870 ml free water  11/9: Patient with approximately 20 lb weight loss in the last month. Severe deconditioning noted. Dysphagia 3 thin liquid diet with minced meats.  Tolerating well with good intake. Assisted patient in ordering lunch.   Patient has tried Boost before and likes this. BMI is now 27.   Height: Ht Readings from Last 1 Encounters:  07/02/14 5\' 10"  (1.778 m)    Weight Status:   Wt Readings from Last 1 Encounters:  07/03/14 189 lb 9.5 oz (86 kg)  06/05/14 208 lb (94.5 kg)  Re-estimated needs:  Kcal: 2300-2400 Protein: 100-110 gram Fluid: 2.4L  Skin: WDL  Diet Order: DIET DYS 3 thin with minced meats.   Intake/Output Summary (Last  24 hours) at 07/06/14 1307 Last data filed at 07/06/14 1200  Gross per 24 hour  Intake    940 ml  Output   2950 ml  Net  -2010 ml    Last BM: 10/27   Labs:   Recent Labs Lab 06/30/14 0325  07/03/14 0403 07/04/14 0350 07/05/14 0335  NA 146  < > 148* 147 139  K 3.9  < > 3.5* 3.6* 4.1  CL 110  < > 112 112 105  CO2 23  < > 23 23 23   BUN 19  < > 18 12 11   CREATININE 0.80  < > 0.79 0.78 0.76  CALCIUM 8.8  < > 8.8 8.7 9.0  MG 1.6  --   --   --   --   PHOS 3.8  --   --   --   --   GLUCOSE 158*  < > 99 103* 108*  < > = values in this interval not displayed.  CBG (last 3)   Recent Labs  07/05/14 1200 07/05/14 1722 07/05/14 2225  GLUCAP 108* 127* 101*    Scheduled Meds: . antiseptic oral rinse  7 mL Mouth Rinse QID  . chlorhexidine  15 mL Mouth Rinse BID  . clonazePAM  1 mg Oral BID  . cloNIDine  0.2 mg Oral TID  . fentaNYL  50 mcg Transdermal Q72H  . metoprolol tartrate  25 mg Oral BID  . [START ON 07/07/2014] phytonadione  10 mg Oral Daily  . ranitidine  150 mg Oral QHS  . rifaximin  200 mg Oral 3 times per day  . risperiDONE  1 mg Oral BID  . sodium chloride  3 mL Intravenous Q12H  . valproic acid  250 mg Oral BID    Continuous Infusions:    Oran ReinLaura Berdella Bacot, RD, LDN Clinical Inpatient Dietitian Pager:  [phone] Weekend and after hours pager:  [phone]

## 2014-07-06 NOTE — Progress Notes (Signed)
Physical Therapy Treatment Patient Details Name: Jerome BodyWilliam [name] MRN: [id number] DOB: 06/11/1976 Today's Date: 07/06/2014    History of Present Illness 38 yo male admitted 10/9 with acute blood loss anemia, respiratory failure, ETOH WD. Intubated 10/12. Trach 10/22. Hx of ETOH abuse, cirrhosis, anemia, HTN, DM.     PT Comments    Patient made significant progress with mobility and activity tolerance this tx.  Pt is supervision to min A with all mobility and made two laps around ICU with one sitting rest break.  Pt was on RA during tx and maintained 90%+ O2 sats.  Pt is unsafe and unsteady during gait; he remains limited by weakness and decreased slightly decreased awareness.    Follow Up Recommendations  CIR;LTACH     Equipment Recommendations  Rolling walker with 5" wheels    Recommendations for Other Services OT consult;Rehab consult     Precautions / Restrictions Precautions Precautions: Fall Precaution Comments: trach Restrictions Weight Bearing Restrictions: No    Mobility  Bed Mobility Overal bed mobility: Needs Assistance Bed Mobility: Supine to Sit;Sit to Supine     Supine to sit: Supervision Sit to supine: Supervision   General bed mobility comments: pt requires increased time  Transfers Overall transfer level: Needs assistance Equipment used: Rolling walker (2 wheeled) Transfers: Sit to/from Stand Sit to Stand: Min guard         General transfer comment: sit to stand x2; pt is slightly impulsive; VCs for hand placement  Ambulation/Gait Ambulation/Gait assistance: Min assist;+2 safety/equipment Ambulation Distance (Feet): 500 Feet (x2) Assistive device: Rolling walker (2 wheeled) Gait Pattern/deviations: Step-through pattern;Drifts right/left     General Gait Details: VCs for RW management and safety; pt required on sitting rest break; pt is unsteady and slightly shaky with gait; pt demo tendency to drift L and can be unaware of obstacles; pt has  decreased safety awareness and is high fall risk   Stairs            Wheelchair Mobility    Modified Rankin (Stroke Patients Only)       Balance                                    Cognition Arousal/Alertness: Awake/alert Behavior During Therapy: WFL for tasks assessed/performed                        Exercises      General Comments        Pertinent Vitals/Pain Pain Assessment: No/denies pain    Home Living                      Prior Function            PT Goals (current goals can now be found in the care plan section) Progress towards PT goals: Progressing toward goals    Frequency  Min 3X/week    PT Plan Current plan remains appropriate    Co-evaluation             End of Session Equipment Utilized During Treatment: Gait belt Activity Tolerance: Patient tolerated treatment well Patient left: in bed;with call bell/phone within reach;with bed alarm set;with family/visitor present     Time: [id number] PT Time Calculation (min): 27 min  Charges:  G Codes:      Fernande Boydenerrick Miller SPTA  reviewed above  Felecia ShellingLori Bobetta Korf  PTA [facility]  Acute  Rehab Pager      [phone]   .

## 2014-07-06 NOTE — Progress Notes (Signed)
Clinical Social Work Department BRIEF PSYCHOSOCIAL ASSESSMENT 07/06/2014  Patient:  Jerome [name], Jerome [name]     Account Number:  [id number]     Admit date:  06/05/2014  Clinical Social Worker:  Ulyess Blossom  Date/Time:  07/06/2014 03:00 PM  Referred by:  Physician  Date Referred:  07/06/2014 Referred for  SNF Placement   Other Referral:   Interview type:  Patient Other interview type:    PSYCHOSOCIAL DATA Living Status:  ALONE Admitted from facility:   Level of care:   Primary support name:  Romie Minus Baumler/father/[phone] Primary support relationship to patient:  PARENT Degree of support available:   strong    CURRENT CONCERNS Current Concerns  Post-Acute Placement   Other Concerns:    SOCIAL WORK ASSESSMENT / PLAN CSW received referral that [facility] stated that pt is not appropriate for inpatient rehab given poor social supports. Recommendation now SNF.    CSW met with pt at bedside. CSW introduced self and explained role. Pt reports that he lived alone prior to admission. Pt shared that pt parents are a strong support to him. CSW discussed with pt recommendation for rehab prior to returning home. Pt stated that he was hopeful to go to outpatient rehabiliatation upon discharge. CSW discussed that at this time MD and PT/OT are recommending for pt to have rehabilitation inpatient at a facility. CSW discussed that this may be a skilled nursing facility or an inpatient rehab facility if CSW able to locate an inpatient rehab in [place]. Pt agreeable for CSW to explore options. CSW provided support as pt discussed events leading to his admission.    CSW completed FL2 and initiated referral to [facility].    CSW initiated referral to [facility] as well.    CSW to follow up on referrals and expand search if necessary.    CSW to continue to follow to provide support and assist with pt disposition needs.    Assessment/plan status:  Psychosocial Support/Ongoing Assessment of Needs Other assessment/ plan:   discharge planning   Information/referral to community resources:   Referral to [facility] and Rehab and [place]    PATIENT'S/FAMILY'S RESPONSE TO PLAN OF CARE: Pt alert and oriented x 4. Pt calm and cooperative throughout assessment. Pt was hopeful for outpatient rehab services and expressed eagerness to return home. Pt expressed that he is willing to explore options for rehab.   Alison Murray, MSW, [place] Work [phone]

## 2014-07-06 NOTE — Evaluation (Signed)
Occupational Therapy Evaluation Patient Details Name: Jerome [name] MRN: [id number] DOB: 05/06/1976 Today's Date: 07/06/2014    History of Present Illness 38 yo male admitted 10/9 with acute blood loss anemia, respiratory failure, ETOH WD. Intubated 10/12. Trach 10/22. Hx of ETOH abuse, cirrhosis, anemia, HTN, DM.    Clinical Impression   Pt admitted with anemia. Pt currently with functional limitations due to the deficits listed below (see OT Problem List).  Pt will benefit from skilled OT to increase their safety and independence with ADL and functional mobility for ADL to facilitate discharge to venue listed below.      Follow Up Recommendations  SNF    Equipment Recommendations  None recommended by OT    Recommendations for Other Services       Precautions / Restrictions Precautions Precautions: Fall Precaution Comments: trach Restrictions Weight Bearing Restrictions: No      Mobility Bed Mobility Overal bed mobility: Needs Assistance Bed Mobility: Supine to Sit;Sit to Supine     Supine to sit: Supervision Sit to supine: Supervision   General bed mobility comments: pt requires increased time  Transfers Overall transfer level: Needs assistance Equipment used: 1 person hand held assist Transfers: Sit to/from Stand;Stand Pivot Transfers Sit to Stand: Min guard Stand pivot transfers: Min guard       General transfer comment: sit to stand x2; pt is slightly impulsive; VCs for hand placement         ADL Overall ADL's : Needs assistance/impaired Eating/Feeding: Sitting;Set up   Grooming: Set up;Sitting   Upper Body Bathing: Minimal assitance   Lower Body Bathing: Sit to/from stand;Moderate assistance   Upper Body Dressing : Set up;Sitting   Lower Body Dressing: Moderate assistance;Sit to/from stand   Toilet Transfer: Hydrographic surveyorMin guard Toilet Transfer Details (indicate cue type and reason): bed to chair to eat lunch                            Pertinent Vitals/Pain Pain Assessment: No/denies pain           Communication Communication Communication: No difficulties   Cognition Arousal/Alertness: Awake/alert Behavior During Therapy: WFL for tasks assessed/performed Overall Cognitive Status: Within Functional Limits for tasks assessed                     General Comments   pt agreed to get OOB and have his lunch            Home Living Family/patient expects to be discharged to:: Unsure Living Arrangements: Alone Available Help at Discharge: Family Type of Home: House Home Access: Stairs to enter Secretary/administratorntrance Stairs-Number of Steps: 1   Home Layout: One level     Bathroom Shower/Tub: Tub/shower unit         Home Equipment: None               OT Diagnosis: Generalized weakness   OT Problem List: Decreased safety awareness;Decreased strength   OT Treatment/Interventions: Self-care/ADL training;Patient/family education    OT Goals(Current goals can be found in the care plan section) Acute Rehab OT Goals Patient Stated Goal: none stated OT Goal Formulation: With patient Time For Goal Achievement: 07/20/14  OT Frequency: Min 2X/week   Barriers to D/C: Decreased caregiver support             End of Session Nurse Communication: Mobility status  Activity Tolerance: Patient tolerated treatment well Patient left: in chair;with call bell/phone within reach;with chair  alarm set;with family/visitor present   Time: [id number] OT Time Calculation (min): 17 min Charges:  OT General Charges $OT Visit: 1 Procedure OT Evaluation $Initial OT Evaluation Tier I: 1 Procedure OT Treatments $Self Care/Home Management : 8-22 mins G-Codes:    Einar CrowEDDING, Tascha Casares D 07/06/2014, 1:56 PM

## 2014-07-06 NOTE — Progress Notes (Signed)
Speech Language Pathology Treatment: Dysphagia;Passy Muir Speaking valve  Patient Details Name: Jerome [name] MRN: [id number] DOB: 10/29/1975 Today's Date: 07/06/2014 Time: [id number] SLP Time Calculation (min): 20 min  Assessment / Plan / Recommendation Clinical Impression  Pt with much improved tolerance of PMSV, speech intelligibility and phonatory strength today!  Note trach was downsized to a #4 cuffless earlier.  SLP using teach back reviewed PMSV precautions and demonstrate placement and removal of PMSV.  Pt able to state that valve is a one=way valve subsequently allowing him to voice without cues.  Attempts at pt removing own valve with mirror were not successful as produced strong coughing.  SLP advised to defer given trach was changed today = in hopes of trach settling allowing pt to self-remove valve for safety.    Pt recalled "choking" episode on Friday during clinical swallow assessment and denies further occurences over the weekend.  Observed him consuming medicine from RN, solids and liquids with timely swallow and no indications of dysphagia/s/s of aspiration.  Suspect mild dysphagia has resolved.    Recommend advance diet to regular with continued aspiration precautions.  RN reports good tolerance and intake of po.  Will continue to follow pt briefly to assure po/pmsv tolerance.  Thanks for allowing me to help care for this pt.    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 Patient was admitted about 3 months back from [facility] where he was getting detox from alcohol and was found to have low hemoglobin. Patient received 2 unit PRBC , was seen by [facility] and underwent EGD which showed nonbleeding distal esophageal varices, gastritis and portal gastropathy. Patient was discharged home and  was supposed to followup with [place] PCP [place]. Patient reports seeing his PCP once after discharge but did not have any followup lab work . He reports that he was drinking every day  but has cut down to drinking every other day and drinks about 5-to 6 ounces of vodka . He reports having hand tremors on the days he doesn't drink. He informs feeling fatigued and tired for about 7 days. Also reports poor appetite. Developed progressive respiratory failure with Rt > Lt pulmonary infiltrates, and PCCM assumed care in ICU.  Required oral intubation 06/13/14,  self-extubated 10/20, trached 10/22, changed to XLT #6 cuffed shiley; trach collar 11/3, on vent at night; ATC 11/4.   Pertinent Vitals Pain Assessment: 0-10 Pain Score: 7  Pain Location: head Pain Descriptors / Indicators: Aching Pain Intervention(s): Patient requesting pain meds-RN notified;Other (comment) (rn provided medication during session)  SLP Plan  Continue with current plan of care    Recommendations Diet recommendations: Regular;Thin liquid Liquids provided via: Cup;Straw Medication Administration: Whole meds with liquid Supervision: Patient able to self feed;Intermittent supervision to cue for compensatory strategies Compensations: Slow rate;Small sips/bites Postural Changes and/or Swallow Maneuvers: Seated upright 90 degrees;Upright 30-60 min after meal      Patient may use Passy-Muir Speech Valve: During all therapies with supervision;During all waking hours (remove during sleep);During PO intake/meals PMSV Supervision: Intermittent       Oral Care Recommendations: Oral care BID Follow up Recommendations: Inpatient Rehab Plan: Continue with current plan of care    GO     Mills KollerKimball, Beautifull Cisar Ann Ayan [place], MS [facility] SLP [phone]

## 2014-07-07 LAB — CBC
HEMATOCRIT: 24.5 % — AB (ref 39.0–52.0)
HEMOGLOBIN: 7.6 g/dL — AB (ref 13.0–17.0)
MCH: 26 pg (ref 26.0–34.0)
MCHC: 31 g/dL (ref 30.0–36.0)
MCV: 83.9 fL (ref 78.0–100.0)
Platelets: 264 10*3/uL (ref 150–400)
RBC: 2.92 MIL/uL — ABNORMAL LOW (ref 4.22–5.81)
RDW: 22.3 % — AB (ref 11.5–15.5)
WBC: 8 10*3/uL (ref 4.0–10.5)

## 2014-07-07 LAB — BASIC METABOLIC PANEL
ANION GAP: 12 (ref 5–15)
BUN: 10 mg/dL (ref 6–23)
CALCIUM: 9.3 mg/dL (ref 8.4–10.5)
CHLORIDE: 101 meq/L (ref 96–112)
CO2: 23 meq/L (ref 19–32)
CREATININE: 0.73 mg/dL (ref 0.50–1.35)
GFR calc Af Amer: >90 mL/min (ref >90)
GFR calc non Af Amer: >90 mL/min (ref >90)
Glucose, Bld: 116 mg/dL — ABNORMAL HIGH (ref 70–99)
Potassium: 3.8 mEq/L (ref 3.7–5.3)
Sodium: 136 mEq/L — ABNORMAL LOW (ref 137–147)

## 2014-07-07 LAB — GLUCOSE, CAPILLARY
GLUCOSE-CAPILLARY: 108 mg/dL — AB (ref 70–99)
Glucose-Capillary: 125 mg/dL — ABNORMAL HIGH (ref 70–99)
Glucose-Capillary: 113 mg/dL — ABNORMAL HIGH (ref 70–99)

## 2014-07-07 NOTE — Plan of Care (Signed)
Problem: Phase I Progression Outcomes Goal: Dyspnea controlled at rest Outcome: Completed/Met Date Met:  07/07/14

## 2014-07-07 NOTE — Progress Notes (Signed)
Clinical Social Work Department CLINICAL SOCIAL WORK PLACEMENT NOTE 07/07/2014  Patient:  Dion BodyCOLSTON,Tarris  Account Number:  [id number] Admit date:  06/05/2014  Clinical Social Worker:  Jacelyn GripSUZANNA BYRD, LCSWA  Date/time:  07/06/2014 03:30 PM  Clinical Social Work is seeking post-discharge placement for this patient at the following level of care:   SKILLED NURSING   (*CSW will update this form in Epic as items are completed)   07/06/2014  Patient/family provided with [facility] Department of Clinical Social Work's list of facilities offering this level of care within the geographic area requested by the patient (or if unable, by the patient's family).    Patient/family informed of their freedom to choose among providers that offer the needed level of care, that participate in Medicare, Medicaid or managed care program needed by the patient, have an available bed and are willing to accept the patient.    Patient/family informed of [facility]' ownership interest in [facility], as well as of the fact that they are under no obligation to receive care at this facility.  PASARR submitted to EDS on  PASARR number received on   FL2 transmitted to all facilities in geographic area requested by pt/family on  07/06/2014 FL2 transmitted to all facilities within larger geographic area on   Patient informed that his/her managed care company has contracts with or will negotiate with  certain facilities, including the following:     Patient/family informed of bed offers received:  No offers available as facilities did not feel that pt insurance would cover rehab placement. Patient chooses bed at  Physician recommends and patient chooses bed at    Patient to be transferred to  on  Home with pt parents  Patient to be transferred to facility by  Patient and family notified of transfer on  Name of family member notified:    The following physician request were entered in  Epic:   Additional Comments:   Loletta SpecterSuzanna Felise Georgia, MSW, LCSW Clinical Social Work [phone]

## 2014-07-07 NOTE — Progress Notes (Signed)
Patient transferred from ICU to 1422, alert and oriented, denies any pain/distress. Oriented patient to room/unit and reviewed plan of care with patient/family and they verbalized understanding, bed alarm initiated, skin intact with bruises on arms. will continue to assess patient.

## 2014-07-07 NOTE — Progress Notes (Signed)
CSW continuing to follow for disposition planning.  CSW followed up with referrals to [facility]. Facilities discussed that given pt PT treatment from 07/06/14 and pt ambulated min assist 500 feet x 2 that pt insurance [employer] will not cover rehab placement.   CSW discussed with MD who reports that pt parents are agreeable to pt returning to their home upon discharge and that pt has potential for decannulation if remains stable with trach capped.   CSW and RNCM met with pt and pt parents at bedside. CSW discussed that pt insurance will not cover rehab placement given pt improved progress with physical therapy yesterday. Pt and pt parents agreeable to pt returning home with pt parents and having physical therapy at home.   CSW and RNCM discussed concern surrounding pt alcoholism and pt is agreeable to outpatient resources. CSW provided pt with outpatient resources in [place] area for outpatient substance abuse treatment.   Emotional support provided to pt as he discussed hospitalization and struggles with alcoholism prior to admission. Pt parents have been very supportive throughout hospitalization and plan to remain supportive as pt continues his recovery.   Pt to discharge home with pt parents and home physical therapy/occupational therapy once medically ready for discharge.  No further social work needs identified at this time. RNCM arranging physical therapy/occupational therapy follow up. Pt has resources for outpatient substance abuse follow up.  CSW signing off.   Alison Murray, MSW, [place] [facility] [phone]

## 2014-07-07 NOTE — Progress Notes (Signed)
PULMONARY / CRITICAL CARE MEDICINE  Name: Jerome [name] MRN: [id number] DOB:  10/28/1975 ADMISSION DATE: 06/05/2014  CONSULTATION DATE: 06/08/2014  REFERRING MD : TRH  CHIEF COMPLAINT: Respiratory Failure     INITIAL PRESENTATION:  38 y/o male with hx of ETOH presented with dyspnea/fatigue from profound anemia (Hb 4.5), and received multiple transfusions of PRBC.  Developed progressive respiratory failure with Rt > Lt pulmonary infiltrates, and PCCM assumed care in ICU.  ICU course complicated by severe agitated delirium.     SIGNIFICANT EVENTS:  10/09  admitted; PRBC x3, GI consulted 10/09  US abd: There is hepatomegaly and splenomegaly consistent with parenchymal liver disease 10/10  PRBC x1 10/11  PRBC x1; new onset PNA on chest x ray 10/12  acute hypoxic respiratory failure overnight requiring BiPAP. Alcohol withdrawal requiring precedex gtt. Intubated for decreasing LOC and increasing 02 needs and worsening PNA on CXR.  10/13  bronchoscopy >> respiratory secretions from RLL, friable airways 10/13  Rt thoracentesis > 500 ml fluid: transudate  10/14  Echo >> EF 55 to 60%, PAS 33 mmHg 10/16  acidosis from diprivan >> change to versed gtt 10/17  Significant intermittent agitation.  On 45% fio2.hick tenacious ET tube secretions + .  on fent 150mcg and versed 6mg /h gtt. START LACTULOSE AND XIFAXAN 10/19  fio2 50 % and peep 12 on ARDS protocol 10/20  fio2 40% 8 peep. Slow progress 10/20  self extubation with urgent reintubation and fall off bed to floor 10/20  CT head: NAD 10/22  trached (df) 10/23  agitation remains an issue. 10/25  fever, abx started 10/26  sedated or agitated 10/27  agitation remains an issue. Tolerated t collar 4 hours. 10/28  increase t collar as tolerated. Adjustments in anxiety medications. 10/29  will seek LTAC 10/30  ATC one hour 10/31  ATC only 10 minutes, but PSV 15/5 for 12 hours 11/03  Changed trach to 6 XLT distal due to high cuff pressures required  to get Vt on vent. Tolerated all day on ATC. Trying to taper precedex from 3mg /hr down to 2 mg/hr 11/04  OOB, using walker. Still on precedex. Started PMV trials. Went back on vent before midnight   11/05 precedex off.  11/06 trach capped 11/06 swallow eval: D III diet initiated 11/07 Cognition intact. Severe diarrhea. Lactulose DC'd. Remains off vent. Changed to SDU status 11/08 Much improved. Somnolent. Risperdal dose reduced 11/09 Improving cognition, calm. Trach changed to #4 cuffless     Intake/Output Summary (Last 24 hours) at 07/07/14 0803 Last data filed at 07/06/14 2200  Gross per 24 hour  Intake   1180 ml  Output   1700 ml  Net   -520 ml    SUBJECTIVE:  RN reports no acute events.  Pt denies complaints, hopeful to "get out of here"  PHYSICAL EXAM:  Vital signs in last 24 hours: Temp:  [98.7 F (37.1 C)-98.9 F (37.2 C)] 98.7 F (37.1 C) (11/10 0317) Pulse Rate:  [75-118] 86 (11/10 0431) Resp:  [0-24] 14 (11/10 0640) BP: (91-154)/(37-79) 102/43 mmHg (11/10 0640) SpO2:  [90 %-99 %] 95 % (11/10 0640) FiO2 (%):  [21 %-28 %] 21 % (11/09 2048) Last BM Date: 07/04/14   VENT SETTINGS: Vent Mode:  [-]  FiO2 (%):  [21 %-28 %] 21 %   I/O: Intake/Output      11/09 0701 - 11/10 0700 11/10 0701 - 11/11 0700   P.O. 1180    I.V. (mL/kg)     Total  Intake(mL/kg) 1180 (13.7)    Urine (mL/kg/hr) 1700 (0.8)    Total Output 1700     Net -520          Urine Occurrence 1 x      PHYSICAL EXAMINATION:  Gen: NAD HEENT: WNL, trach site clean, tolerating PMV PULM: clear, BL decreased CV: Reg, no M AB: Soft, NT, +BS Ext: warm/dry, no edema Neuro: no focal deficits  Lab Results: I have reviewed all of today's lab results. Relevant abnormalities are discussed in the A/P section  CXR: 11/7 mild edema, small L effusion   ASSESSMENT / PLAN:   PULMONARY  OETT 10/12 >>10/20 self extubation, 10/20>>10/22 Trach #6 (DF)>>changed to #4 cuffless 11/09 >> A:  Acute on  chronic hypoxic respiratory failure 2nd to PNA, and b/l pleural effusions (transudate on Rt).  Prolonged VDRF Trach status P:  Cont ATC Cont supp O2 Monitor CXR intermittently Mobilize as able Potential for decannulation once more mobile  CARDIOVASCULAR  A:  No acute issues P:  Monitor QTc on neuroleptics, 0.50 11/10, continue tele upon transfer  RENAL  A:  Non gap metabolic acidosis, (resolved) Hypernatremia, resolved Hypervolemia, much improved P:  Monitor BMET intermittently Monitor I/Os Correct electrolytes as indicated PRN diuresis as needed to keep I/Os even to slightly neg  GASTROINTESTINAL  A:  Severe protein calorie malnutrition. 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  Hepatic coagulopathy - s/p vitamin K 11/7 x3 doses P: DVT px: SCDs Monitor CBC intermittently Transfuse per usual ICU guidelines Monitor coag trend  INFECTIOUS  A:  HCAP, NOS - treated Fevers, resolved Trach site cellulitis, resolved  10/23 urine>>>neg 10/23 blood>>>neg 10/23 cdiff>>>neg 10/29 bc x 2>> neg  10/29 uc>> neg 10/29 sputum>>normal flora  10/30 c.diff > neg  P:  Ceftaz 10/24>>> 10/27 Vanc 10/25>>>10/27  Vanco 10/30 >>>11/5  Monitor fever curve / leukocytosis    ENDOCRINE  A:  DMII, diet controlled Hyperglycemia, very well controlled P:  DC SSI Resume for glu > 180  NEUROLOGIC  A:  Acute encephalopathy, much improved Delirium tremens, resolved ICU associated discomfort Severe deconditioning P:  Begin to taper sedating meds slowly - risperdal dose decreased 11/08 PT following OOB to chair BID   Dispo - plan for SNF vs home with PT.  Await SNF placement. SW working on placement near parents [place] in [place].  They have been very supportive, involved in his care.  Tx to med tele bed.   Canary BrimBrandi Ollis, NP-C [facility] Pgr: [phone] or  [phone]  ATTENDING NOTE: I have personally reviewed patient's available data, including medical history, events of note, physical examination and test results as part of my evaluation. I have discussed with resident/NP and other careteam providers such as pharmacist, RN and RRT & co-ordinated with consultants. In addition, I personally evaluated patient and elicited key history of resp failure requiring tstomy, exam findings of tolerating downsizing to #4 tstomy , tolerating PM valve & labs showing chronic anemia.  WIll cap tstomy & if tolerated, consider decannulation -this may change his dispo , plan for trach-SNF for now [place] per NP/medical resident whose note is outlined above and that I agree with and edited in full.    Oretha MilchALVA,Aeson Sawyers V. MD [phone]

## 2014-07-07 NOTE — Progress Notes (Signed)
Speech Language Pathology Treatment:  Patient Details Name: Jerome [name] MRN: [id number] DOB: 15-Sep-1975 Today's Date: 07/07/2014 Time: 9741-6384 SLP Time Calculation (min) (ACUTE ONLY): 8 min  Assessment / Plan / Recommendation Clinical Impression  Pt now with trach capped with great tolerance per family.  Pt sound asleep upon SLP arrival.  Parents Jerome [name] and Jerome [name]) present and report great tolerance of intake and no signs of difficulties.   Requested family keep PMSV after cleaning well for future use if indicated.  Instructed them to clean with a mild soap and allow to air dry.   Also educated parents to need to continue general aspiration precautions and that diet is advanced now to regular per [facility] order earlier today.  SLP will sign off at this time, as pt is now capped and tolerating po diet well with resolution of mild dysphagia s/p ventilator support.  Thanks for allowing me to help care for this pt.     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 Patient was admitted about 3 months back from [facility] where he was getting detox from alcohol and was found to have low hemoglobin. Patient received 2 unit PRBC , was seen by [facility] and underwent EGD which showed nonbleeding distal esophageal varices, gastritis and portal gastropathy. Patient was discharged home and was supposed to followup with his PCP in gastroenterology in  [place]. Patient reports seeing his PCP once after discharge but did not have any followup lab work . He reports that he was drinking every day  but has cut down to drinking every other day and drinks about 5-to 6 ounces of vodka . He reports having hand tremors on the days he doesn't drink. He informs feeling fatigued and tired for about 7 days. Also reports poor appetite.  Developed progressive respiratory failure with Rt > Lt pulmonary infiltrates, and PCCM assumed care in ICU.  Required oral intubation 06/13/14,  self-extubated 10/20, trached 10/22, changed to XLT #6 cuffed shiley; trach collar 11/3, on vent at night; ATC 11/4, downsized to #4 cuffless trach, capped today with plan for possible decannulation tomorrow per family.  SLP has followed pt for PMSV and dietary tolerance.  Pt has been on a dys3/thin diet - recommended advance to regular/thin.     Pertinent Vitals Pain Assessment:  (pt asleep - no s/s of pain)  SLP Plan  All goals met    Recommendations Diet recommendations: Regular;Thin liquid Liquids provided via: Cup;Straw Medication Administration: Whole meds with liquid Supervision: Patient able to self feed (family reports pt eating slowly without need for cues at this time and tolerating well) Compensations: Slow rate;Small sips/bites Postural Changes and/or Swallow Maneuvers: Seated upright 90 degrees;Upright 30-60 min after meal              Oral Care Recommendations: Oral care BID Follow up Recommendations: None Plan: All goals met    [place], [place], [facility] SLP [phone]

## 2014-07-08 LAB — BASIC METABOLIC PANEL
Anion gap: 11 (ref 5–15)
BUN: 12 mg/dL (ref 6–23)
CO2: 24 meq/L (ref 19–32)
Calcium: 9.5 mg/dL (ref 8.4–10.5)
Chloride: 103 mEq/L (ref 96–112)
Creatinine, Ser: 0.85 mg/dL (ref 0.50–1.35)
GFR calc Af Amer: >90 mL/min (ref >90)
GLUCOSE: 126 mg/dL — AB (ref 70–99)
POTASSIUM: 3.8 meq/L (ref 3.7–5.3)
Sodium: 138 mEq/L (ref 137–147)

## 2014-07-08 LAB — CBC
HCT: 23.8 % — ABNORMAL LOW (ref 39.0–52.0)
Hemoglobin: 7.2 g/dL — ABNORMAL LOW (ref 13.0–17.0)
MCH: 25.4 pg — ABNORMAL LOW (ref 26.0–34.0)
MCHC: 30.3 g/dL (ref 30.0–36.0)
MCV: 83.8 fL (ref 78.0–100.0)
PLATELETS: 251 10*3/uL (ref 150–400)
RBC: 2.84 MIL/uL — AB (ref 4.22–5.81)
RDW: 21.8 % — ABNORMAL HIGH (ref 11.5–15.5)
WBC: 8.5 10*3/uL (ref 4.0–10.5)

## 2014-07-08 LAB — GLUCOSE, CAPILLARY
Glucose-Capillary: 105 mg/dL — ABNORMAL HIGH (ref 70–99)
Glucose-Capillary: 104 mg/dL — ABNORMAL HIGH (ref 70–99)
Glucose-Capillary: 139 mg/dL — ABNORMAL HIGH (ref 70–99)

## 2014-07-08 MED ORDER — CLONIDINE HCL 0.1 MG PO TABS
0.1000 mg | ORAL_TABLET | Freq: Two times a day (BID) | ORAL | Status: DC
  Administered 2014-07-08 – 2014-07-09 (×3): 0.1 mg via ORAL
  Filled 2014-07-08 (×4): qty 1

## 2014-07-08 MED ORDER — CLONAZEPAM 0.5 MG PO TABS
0.5000 mg | ORAL_TABLET | Freq: Two times a day (BID) | ORAL | Status: DC
  Administered 2014-07-08 – 2014-07-09 (×2): 0.5 mg via ORAL
  Filled 2014-07-08 (×2): qty 1

## 2014-07-08 MED ORDER — ACETAMINOPHEN 325 MG PO TABS
325.0000 mg | ORAL_TABLET | Freq: Four times a day (QID) | ORAL | Status: DC | PRN
  Administered 2014-07-08: 325 mg via ORAL
  Filled 2014-07-08: qty 1

## 2014-07-08 MED ORDER — CLONIDINE HCL 0.1 MG PO TABS
0.1000 mg | ORAL_TABLET | Freq: Three times a day (TID) | ORAL | Status: DC
  Filled 2014-07-08 (×2): qty 1

## 2014-07-08 MED ORDER — RISPERIDONE 1 MG PO TABS
1.0000 mg | ORAL_TABLET | Freq: Every day | ORAL | Status: DC
  Filled 2014-07-08: qty 1

## 2014-07-08 NOTE — Progress Notes (Signed)
Stoma CK done. Pt vitals stable, no distress noted, no drainage from stoma. Pt remains on bedside pulse ox and emergency equipment in room.

## 2014-07-08 NOTE — Progress Notes (Signed)
Pt decannulated per MD order. Pt in no distress,vitals stable,able to talk,family at bedside. Rt placed white gauze over stoma no skin break down noted. Pt doing well at this time.

## 2014-07-08 NOTE — Plan of Care (Signed)
Problem: Phase I Progression Outcomes Goal: O2 sats > or equal 90% or at baseline Outcome: Completed/Met Date Met:  07/08/14

## 2014-07-08 NOTE — Progress Notes (Signed)
Occupational Therapy Treatment Patient Details Name: Jerome BodyWilliam Lahey MRN: [id number] DOB: 08/03/1976 Today's Date: 07/08/2014    History of present illness 38 yo male admitted 10/9 with acute blood loss anemia, respiratory failure, ETOH WD. Intubated 10/12. Trach 10/22. Hx of ETOH abuse, cirrhosis, anemia, HTN, DM.    OT comments  Plan is now home with parents  Follow Up Recommendations  Home health OT    Equipment Recommendations  None recommended by OT       Precautions / Restrictions Precautions Precautions: Fall Precaution Comments: trach              ADL       Grooming: Standing;Min guard           [name] Dressing : Set up;Sitting   [name] Dressing: Min guard;Sit to/from stand   Toilet Transfer: Min guard;RW   Toileting- ArchitectClothing Manipulation and Hygiene: Min guard;Sit to/from stand         General ADL Comments: pt tends to be impulive at times.  Pt was sitting in chair in criss cross position and reached for something on the bed and almost fell out. educated pt in safety and uncrossing legs prior to reaching                Cognition   Behavior During Therapy: [facility] for tasks assessed/performed Overall Cognitive Status:  (impulsive at times.  OT provided VC to clean up spilled ice that pt spilled setting up breakfast.  Pt aware of  ice he spilled but needed VC to clean it up even though it was near cell phone)                               General Comments  pt impulsive at times    Pertinent Vitals/ Pain       Pain Score: 0-No pain         Frequency Min 2X/week     Progress Toward Goals  OT Goals(current goals can now be found in the care plan section)  Progress towards OT goals: Progressing toward goals  Acute Rehab OT Goals Patient Stated Goal: go home with parents  Plan Discharge plan needs to be updated       End of Session     Activity Tolerance Patient tolerated treatment well   Patient Left   pt in  chair with chair alarm eating breakfast with call bell with in reach   Nurse Communication  pt in chair with chair alarm eating breakfast with call bell with in reach        Time: 0805-0828 OT Time Calculation (min): 23 min  Charges: OT General Charges $OT Visit: 1 Procedure OT Treatments $Self Care/Home Management : 23-37 mins  Adean Milosevic, Metro KungLorraine D 07/08/2014, 8:42 AM

## 2014-07-08 NOTE — Progress Notes (Signed)
PULMONARY / CRITICAL CARE MEDICINE  Name: Jerome [name] MRN: [id number] DOB:  10/28/1975 ADMISSION DATE: 06/05/2014  CONSULTATION DATE: 06/08/2014  REFERRING MD : TRH  CHIEF COMPLAINT: Respiratory Failure     INITIAL PRESENTATION:  38 y/o male with hx of ETOH presented with dyspnea/fatigue from profound anemia (Hb 4.5), and received multiple transfusions of PRBC.  Developed progressive respiratory failure with Rt > Lt pulmonary infiltrates, and PCCM assumed care in ICU.  ICU course complicated by severe agitated delirium.     SIGNIFICANT EVENTS:  10/09  admitted; PRBC x3, GI consulted 10/09  US abd: There is hepatomegaly and splenomegaly consistent with parenchymal liver disease 10/10  PRBC x1 10/11  PRBC x1; new onset PNA on chest x ray 10/12  acute hypoxic respiratory failure overnight requiring BiPAP. Alcohol withdrawal requiring precedex gtt. Intubated for decreasing LOC and increasing 02 needs and worsening PNA on CXR.  10/13  bronchoscopy >> respiratory secretions from RLL, friable airways 10/13  Rt thoracentesis > 500 ml fluid: transudate  10/14  Echo >> EF 55 to 60%, PAS 33 mmHg 10/16  acidosis from diprivan >> change to versed gtt 10/17  Significant intermittent agitation.  On 45% fio2.hick tenacious ET tube secretions + .  on fent 150mcg and versed 6mg /h gtt. START LACTULOSE AND XIFAXAN 10/19  fio2 50 % and peep 12 on ARDS protocol 10/20  fio2 40% 8 peep. Slow progress 10/20  self extubation with urgent reintubation and fall off bed to floor 10/20  CT head: NAD 10/22  trached (df) 10/23  agitation remains an issue. 10/25  fever, abx started 10/26  sedated or agitated 10/27  agitation remains an issue. Tolerated t collar 4 hours. 10/28  increase t collar as tolerated. Adjustments in anxiety medications. 10/29  will seek LTAC 10/30  ATC one hour 10/31  ATC only 10 minutes, but PSV 15/5 for 12 hours 11/03  Changed trach to 6 XLT distal due to high cuff pressures required  to get Vt on vent. Tolerated all day on ATC. Trying to taper precedex from 3mg /hr down to 2 mg/hr 11/04  OOB, using walker. Still on precedex. Started PMV trials. Went back on vent before midnight   11/05 precedex off.  11/06 trach capped 11/06 swallow eval: D III diet initiated 11/07 Cognition intact. Severe diarrhea. Lactulose DC'd. Remains off vent. Changed to SDU status 11/08 Much improved. Somnolent. Risperdal dose reduced 11/09 Improving cognition, calm. Trach changed to #4 cuffless     Intake/Output Summary (Last 24 hours) at 07/08/14 1214 Last data filed at 07/07/14 1500  Gross per 24 hour  Intake    300 ml  Output      0 ml  Net    300 ml    SUBJECTIVE:  Afebrile No dyspnea, CP with tstomy capped  PHYSICAL EXAM:  Vital signs in last 24 hours: Temp:  [98.3 F (36.8 C)-98.9 F (37.2 C)] 98.3 F (36.8 C) (11/11 0412) Pulse Rate:  [76-107] 83 (11/11 1154) Resp:  [16-20] 18 (11/11 1154) BP: (103-118)/(55-64) 103/55 mmHg (11/11 0412) SpO2:  [93 %-99 %] 94 % (11/11 1154) Last BM Date: 07/05/14 (per patient. )   VENT SETTINGS:     I/O: Intake/Output      11/10 0701 - 11/11 0700 11/11 0701 - 11/12 0700   P.O. 662    I.V. (mL/kg) 3 (0)    Total Intake(mL/kg) 665 (7.7)    Urine (mL/kg/hr)     Total Output  Net +665          Urine Occurrence 5 x      PHYSICAL EXAMINATION:  Gen: NAD HEENT: WNL, trach site clean, tolerating PMV PULM: clear, BL decreased CV: Reg, no M AB: Soft, NT, +BS Ext: warm/dry, no edema Neuro: no focal deficits  Lab Results: I have reviewed all of today's lab results. Relevant abnormalities are discussed in the A/P section  CXR: 11/7 mild edema, small L effusion   ASSESSMENT / PLAN:   PULMONARY  OETT 10/12 >>10/20 self extubation, 10/20>>10/22 Trach #6 (DF)>>changed to #4 cuffless 11/09 >> A:  Acute on chronic hypoxic respiratory failure 2nd to PNA, and b/l pleural effusions (transudate on Rt).  Prolonged VDRF Trach  status P:  Mobilize   decannulate  CARDIOVASCULAR  A:  No acute issues P:  Monitor QTc on neuroleptics, 0.50 11/10, continue tele upon transfer  RENAL  A:  Non gap metabolic acidosis, (resolved) Hypernatremia, resolved Hypervolemia, much improved P:  Monitor BMET intermittently Monitor I/Os Correct electrolytes as indicated PRN diuresis as needed to keep I/Os even to slightly neg  GASTROINTESTINAL  A:  Severe protein calorie malnutrition. 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  Hepatic coagulopathy - s/p vitamin K 11/7 x3 doses P: DVT px: SCDs Monitor CBC intermittently Transfuse per usual ICU guidelines Monitor coag trend  INFECTIOUS  A:  HCAP, NOS - treated Fevers, resolved Trach site cellulitis, resolved  10/23 urine>>>neg 10/23 blood>>>neg 10/23 cdiff>>>neg 10/29 bc x 2>> neg  10/29 uc>> neg 10/29 sputum>>normal flora  10/30 c.diff > neg  P:  Ceftaz 10/24>>> 10/27 Vanc 10/25>>>10/27  Vanco 10/30 >>>11/5  Monitor fever curve / leukocytosis    ENDOCRINE  A:  DMII, diet controlled Hyperglycemia, very well controlled P:  DC SSI Resume for glu > 180  NEUROLOGIC  A:  Acute encephalopathy, much improved Delirium tremens, resolved Severe deconditioning P:   taper sedating meds slowly - clonidine , risperdal dose decreased 11/11, dc fent patch PT following OOB to chair BID   Dispo - If succesful decannulation , Home PT ,SW working [place].  They have been very supportive, involved in his care.     Oretha MilchALVA,RAKESH V. MD [phone]

## 2014-07-08 NOTE — Progress Notes (Signed)
Physical Therapy Treatment Patient Details Name: Jerome [name] MRN: [id number] DOB: 04/10/1976 Today's Date: 07/08/2014    History of Present Illness 38 yo male admitted 10/9 with acute blood loss anemia, respiratory failure, ETOH WD. Intubated 10/12. Trach 10/22. Hx of ETOH abuse, cirrhosis, anemia, HTN, DM.     PT Comments    Assisted pt OOB and amb around unit twice with RW.  Pt progressing very well.   Follow Up Recommendations  Home health PT ([facility] )     Equipment Recommendations  Rolling walker with 5" wheels    Recommendations for Other Services       Precautions / Restrictions Precautions Precautions: Fall Precaution Comments: trach D/C Restrictions Weight Bearing Restrictions: No    Mobility  Bed Mobility Overal bed mobility: Needs Assistance Bed Mobility: Supine to Sit     Supine to sit: Supervision     General bed mobility comments: pt requires increased time  Transfers Overall transfer level: Needs assistance Equipment used: None Transfers: Sit to/from Stand Sit to Stand: Supervision         General transfer comment: sit to stand x2; pt is slightly impulsive; VCs for hand placement  Ambulation/Gait Ambulation/Gait assistance: Min guard;Supervision Ambulation Distance (Feet): 650 Feet Assistive device: Rolling walker (2 wheeled) Gait Pattern/deviations: Step-to pattern;Step-through pattern;Drifts right/left Gait velocity: decreased   General Gait Details: VCs for RW management and safety; pt is unsteady and slightly shaky with gait; pt demo tendency to drift L and can be unaware of obstacles; pt has decreased safety awareness and is high fall risk   Stairs            Wheelchair Mobility    Modified Rankin (Stroke Patients Only)       Balance                                    Cognition                            Exercises      General Comments        Pertinent  Vitals/Pain      Home Living                      Prior Function            PT Goals (current goals can now be found in the care plan section) Progress towards PT goals: Progressing toward goals    Frequency  Min 3X/week    PT Plan Current plan remains appropriate    Co-evaluation             End of Session Equipment Utilized During Treatment: Gait belt Activity Tolerance: Patient tolerated treatment well Patient left: in chair;with call bell/phone within reach;with chair alarm set     Time: 1426-1443 PT Time Calculation (min) (ACUTE ONLY): 17 min  Charges:  $Gait Training: 8-22 mins                    G Codes:      Felecia ShellingLori Tijah Hane  PTA [facility]  Acute  Rehab Pager      [phone]

## 2014-07-09 LAB — GLUCOSE, CAPILLARY
Glucose-Capillary: 128 mg/dL — ABNORMAL HIGH (ref 70–99)
Glucose-Capillary: 120 mg/dL — ABNORMAL HIGH (ref 70–99)

## 2014-07-09 MED ORDER — CLONIDINE HCL 0.1 MG PO TABS: ORAL_TABLET | ORAL | Status: AC

## 2014-07-09 MED ORDER — RIFAXIMIN 200 MG PO TABS: 200.0000 mg | ORAL_TABLET | Freq: Three times a day (TID) | ORAL | Status: AC

## 2014-07-09 NOTE — Progress Notes (Signed)
Occupational Therapy Treatment Patient Details Name: Dion BodyWilliam Magno MRN: [id number] DOB: 04/07/1976 Today's Date: 07/09/2014    History of present illness 38 yo male admitted 10/9 with acute blood loss anemia, respiratory failure, ETOH WD. Intubated 10/12. Trach 10/22. Hx of ETOH abuse, cirrhosis, anemia, HTN, DM.    OT comments  Pts parents are very involved and will a pt as needed.     Equipment Recommendations  None recommended by OT       Precautions / Restrictions Precautions Precautions: Fall Precaution Comments: trach D/C Restrictions Weight Bearing Restrictions: No       Mobility Bed Mobility   Bed Mobility: Supine to Sit     Supine to sit: Supervision Sit to supine: Supervision      Transfers Overall transfer level: Needs assistance Equipment used: None Transfers: Sit to/from Stand Sit to Stand: Supervision Stand pivot transfers: Supervision       General transfer comment: pt anxious to go home this day        ADL                       Lower Body Dressing: Supervision/safety;Sit to/from stand   Toilet Transfer: Supervision/safety;Comfort height toilet       Tub/ Shower Transfer: Minimal assistance;Shower seat;Grab Investment banker, operationalbars Tub/Shower Transfer Details (indicate cue type and reason): pts mom has a walk in shower with a grab bar and a seat   General ADL Comments: Pts mom present. Discussed ADL activity at home and activity. Educated pt and mom on progressing activity  in the home and stay OOB/ Pt and mom verbalize understanding.  Pt will go to OP OT                Cognition   Behavior During Therapy: [facility]FL for tasks assessed/performed Overall Cognitive Status: Within Functional Limits for tasks assessed                               General Comments  pt with good participation . PArents present and will provide A as needed    Pertinent Vitals/ Pain       Pain Assessment: No/denies pain         Frequency Min 2X/week      Progress Toward Goals  OT Goals(current goals can now be found in the care plan section)  Progress towards OT goals: Progressing toward goals  Acute Rehab OT Goals Patient Stated Goal: go home with parents  Plan Discharge plan remains appropriate    Co-evaluation                 End of Session     Activity Tolerance Patient tolerated treatment well   Patient Left in bed;with call bell/phone within reach;with family/visitor present   Nurse Communication Mobility status        Time: 5366-44031147-1202 OT Time Calculation (min): 15 min  Charges: OT General Charges $OT Visit: 1 Procedure OT Treatments $Self Care/Home Management : 8-22 mins  Letisia Schwalb, Karin GoldenLorraine D 07/09/2014, 12:04 PM

## 2014-07-09 NOTE — Trach Care Team (Addendum)
Trach Care Progression Note   Patient Details Name: Jerome [name] MRN: [id number] DOB: 06/09/1976 Today's Date: 07/09/2014   Tracheostomy Assessment       Care Needs     Respiratory Therapy O2 Device: Not Delivered FiO2 (%): 21 % SpO2: 90 %    Speech Language Pathology  Patient may use Passy-Muir Speech Valve: During all therapies with supervision, During all waking hours (remove during sleep), During PO intake/meals PMSV Supervision: Intermittent Follow up Recommendations: None   Physical Therapy Ambulation/Gait assistance: Min guard, Supervision PT Recommendation/Assessment: Patient needs continued PT services Follow Up Recommendations: Home health PT ([facility] ) PT equipment: Rolling walker with 5" wheels    Occupational Therapy OT Recommendation/Assessment: Patient needs continued OT Services Follow Up Recommendations: Home health OT OT Equipment: None recommended by OT    Nutritional Patient's Current Diet: Other (Comment) (dysphagia3) Tube Feeding: Vital AF 1.2 Cal Tube Feeding Frequency: Other (Comment) (feeding tube out) Tube Feeding Strength: Full strength Diet Recommendations: Dysphagia 3 (Mechanical Soft), Thin liquid    Case Management/Social Work      Theatre managerrovider Trach Care Team/Provider Recommendations [facility] Care Team Members -  Harlon DittyBonnie DeBlois, SLP, Gretta CoolZackary Brooks, SW Anders SimmondsPete Babcock, ACNP Decannulated on 07/08/14           Sencere Symonette, Silva BandyDebra Anita (scribe for team) 07/09/2014, 2:51 PM

## 2014-07-09 NOTE — Progress Notes (Signed)
CARE MANAGEMENT NOTE 07/09/2014  Patient:  Jerome [name],Jerome [name]   Account Number:  [id number]  Date Initiated:  06/09/2014  Documentation initiated by:  DAVIS,RHONDA  Subjective/Objective Assessment:   Bronchoscopy  Indications: Pneumonia with respiratory failure and mucus plugging on Rt/Vineet Craige CottaSood, MD Physician Signed Critical Care Procedures Service date: 06/09/2014 10:08 AM   Thoracentesis Procedure Note    intubated     Action/Plan:   tbd   Anticipated DC Date:  07/09/2014   Anticipated DC Plan:  HOME W HOME HEALTH SERVICES  In-house referral  Clinical Social Worker      DC Planning Services  CM consult      Choice offered to / List presented to:  C-1 Patient        HH arranged  HH-1 RN  HH-2 PT  HH-3 OT      [facility]   Status of service:  In process, will continue to follow Medicare Important Message given?  NA - LOS <3 / Initial given by admissions (If response is "NO", the following Medicare IM given date fields will be blank) Date Medicare IM given:   Medicare IM given by:   Date Additional Medicare IM given:   Additional Medicare IM given by:    Discharge Disposition:    Per UR Regulation:  Reviewed for med. necessity/level of care/duration of stay  If discussed at Long Length of Stay Meetings, dates discussed:   06/11/2014  06/16/2014  06/18/2014  06/22/2014  06/23/2014  06/25/2014  06/30/2014  07/02/2014  07/07/2014  07/09/2014    Comments:  07/09/14 MMcgibboney, RN, BSN Pt states he is going home today. PCP Dr. Lorelei PontMark Mahoney [phone], [place], [place].  [facility] [place], [facility] called, [phone]. Pt's information faxed to [phone]. [facility] Sat. 07/11/14. [facility] not [facility]  Pt states that he will not need ST.  07/08/14 MMcGibboney, RN, BSN at [employer] Spoke with pt concerning discharge plans, PCP and [facility]. Pt's PCP is Dr. Leary RocaMahoney and he selected  [facility].

## 2014-07-09 NOTE — Discharge Instructions (Signed)
1.  No driving until seen by your primary MD 2.  Do not return to work until seen by your primary MD 3.  Keep trach site clean and dry.  Ok to clean with warm water and soap.  Rinse site completely and dry.   Keep covered with a bandaid until site is closed.  You may have air escape from site or secretions and this is normal.  Monitor for signs of infection:  Warmth at site, discolored drainage or redness.   4.  Hold your lantus until seen by PCP to review your blood sugars.  Please record blood glucose with you and take with you to your appointment.   5.  Review your medications carefully as they have changed.   6.  Do not face the shower, swim or submerge in water until trach stoma closed.

## 2014-09-28 DEATH — deceased

## 2016-06-06 IMAGING — US US ABDOMEN COMPLETE
1 series · 13 of 25 positions shown · non-contrast
Comparison: March 19, 2014

CLINICAL DATA: Hepatic cirrhosis

EXAM:
ULTRASOUND ABDOMEN COMPLETE

[Series 1: us abdomen complete · 0.27mm/px · 13 of 74 slices shown]
[im 1/74]
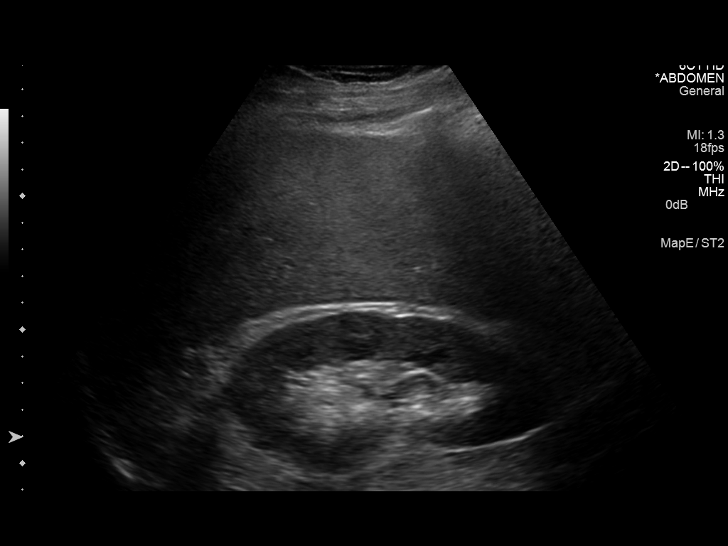
[im 7/74]
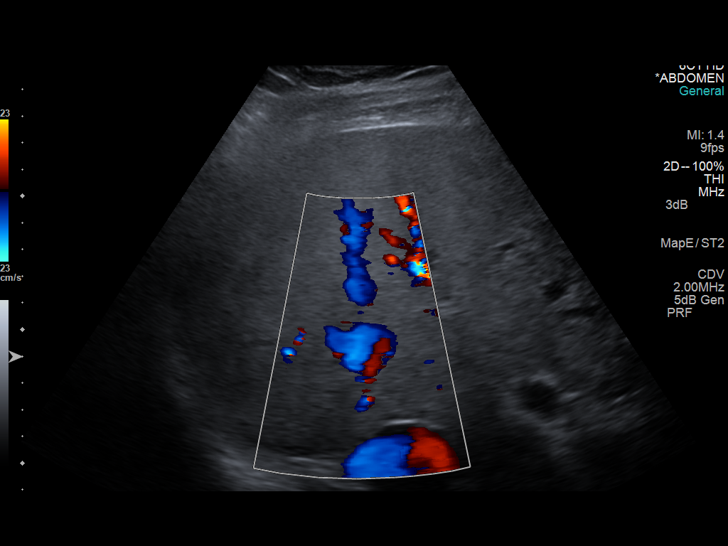
[im 13/74]
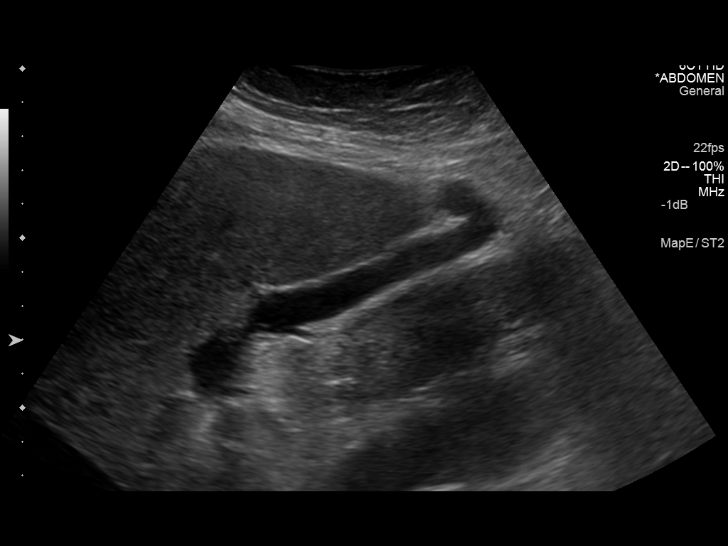
[im 19/74]
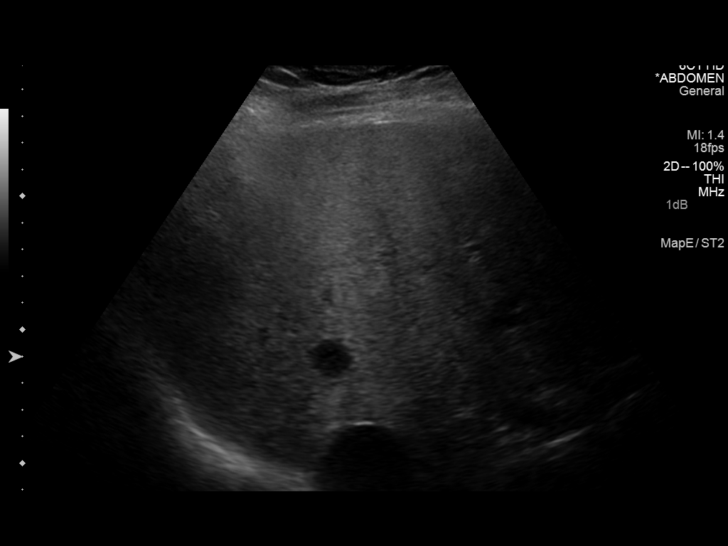
[im 25/74]
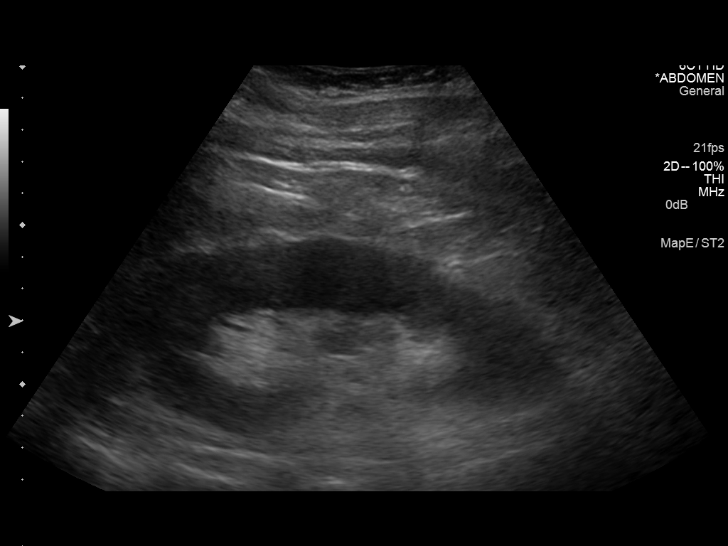
[im 31/74]
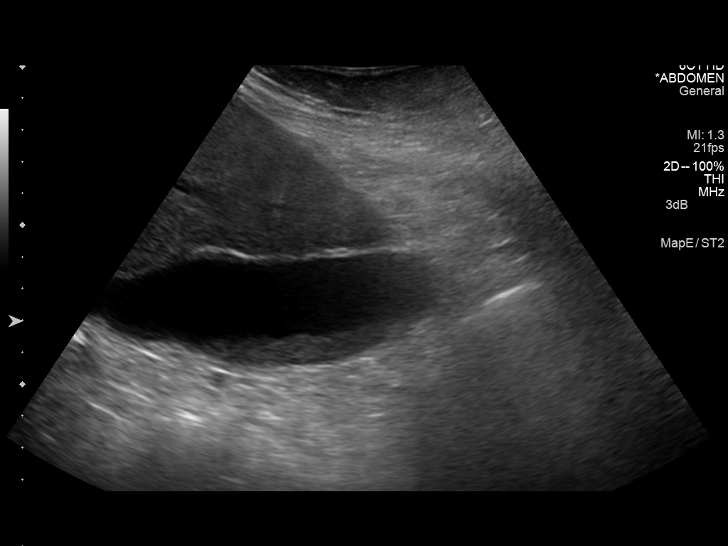
[im 37/74]
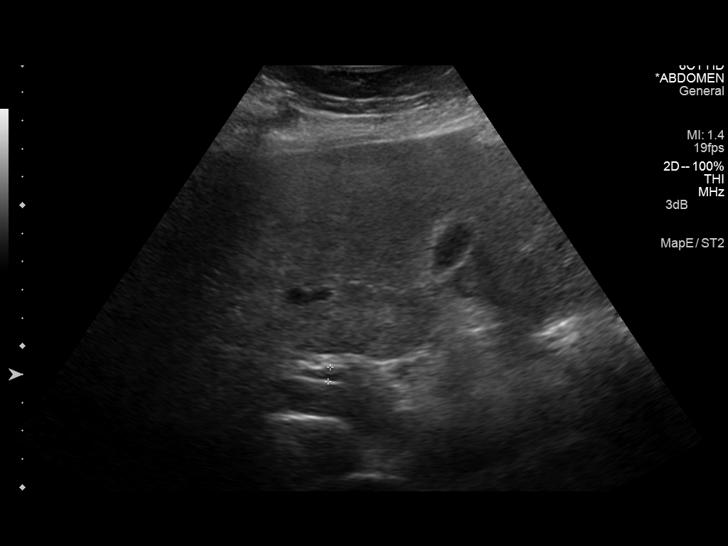
[im 43/74]
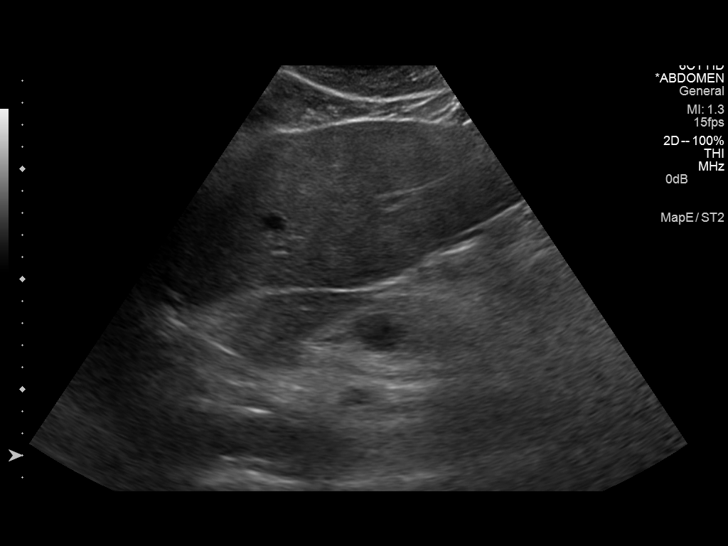
[im 49/74]
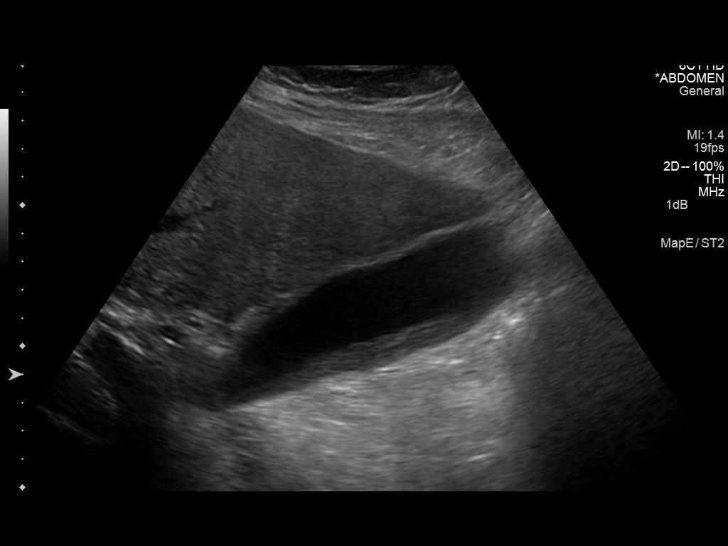
[im 55/74]
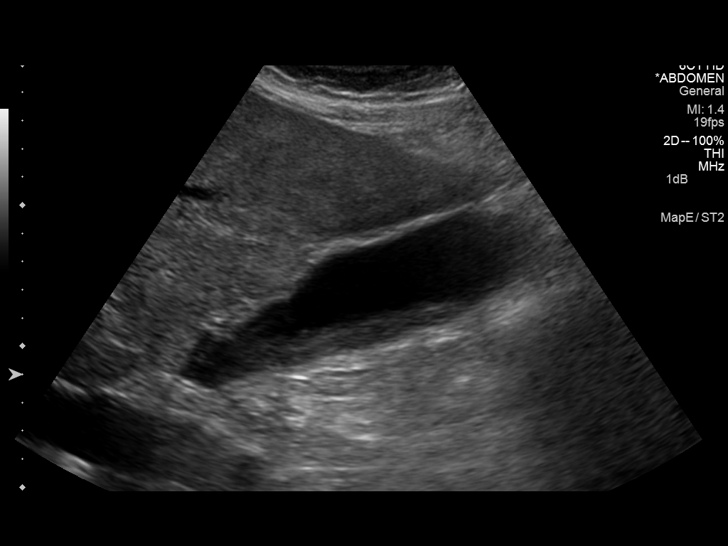
[im 61/74]
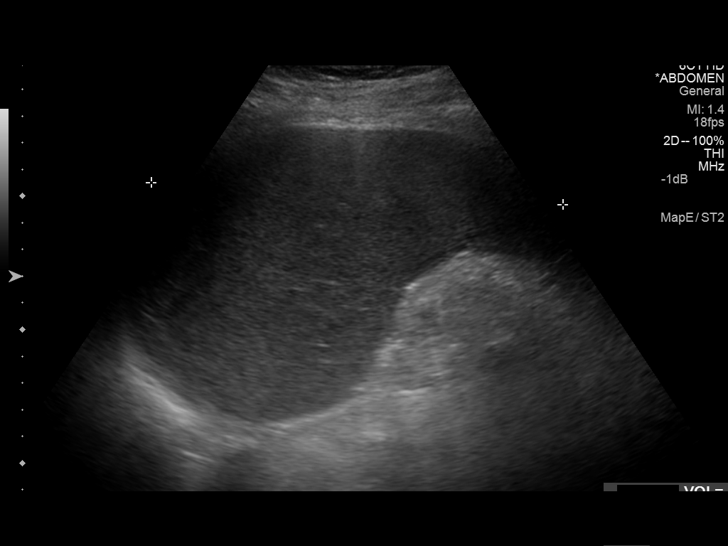
[im 67/74]
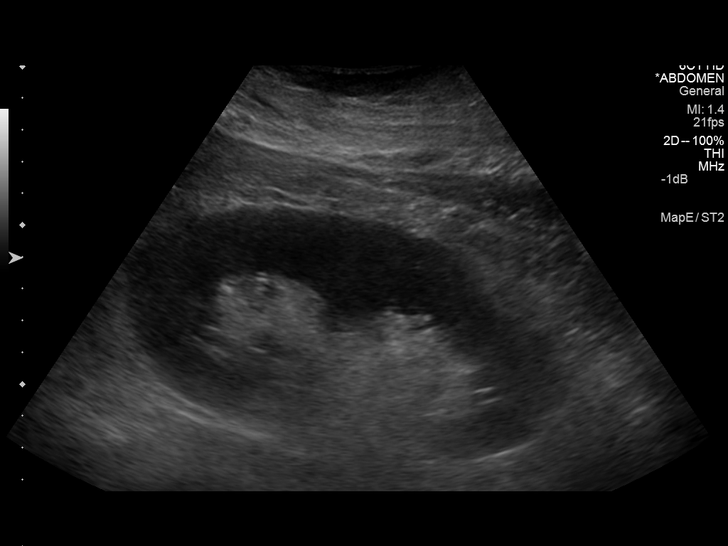
[im 74/74]
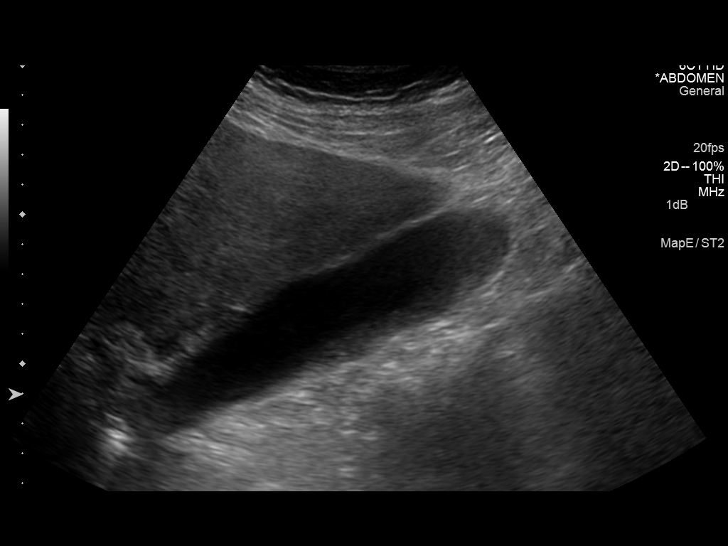

[13 of 25 positions shown; findings below may reference images not displayed]

FINDINGS: Gallbladder: There is sludge within the gallbladder. No gallstones
are appreciated. Gallbladder wall appears borderline thickened.
There is no pericholecystic fluid or gallbladder wall edema. No
sonographic Murphy sign noted.

Common bile duct: Diameter: 5 mm. There is no intrahepatic, common
hepatic, or common bile duct dilatation.

Liver: No focal lesion identified. Liver measures approximately 20
cm in length, enlarged. The liver echogenicity is inhomogeneous
consistent with underlying parenchymal disease. There is
recanalization of the umbilical vein.

IVC: No abnormality visualized.

Pancreas: Visualized portion unremarkable. Portions of pancreas
obscured by gas.

Spleen: Spleen is enlarged measuring 15.4 x 17.4 x 9.5 cm with a
measured splenic volume of 1,337 cubic cm

Right Kidney: Length: 13.5 cm. Echogenicity within normal limits. No
mass or hydronephrosis visualized.

Left Kidney: Length: 14.4 cm. Echogenicity within normal limits. No
mass or hydronephrosis visualized.

Abdominal aorta: No aneurysm visualized.

Other findings: No appreciable ascites.
IMPRESSION: There is hepatomegaly and splenomegaly consistent with parenchymal
liver disease. The echotexture of the liver is inhomogeneous
consistent with the underlying parenchymal disease. While no focal
liver lesions are identified, it must be cautioned that the
sensitivity of ultrasound for focal liver lesions is diminished
significantly in this circumstance. Note that there is
recanalization of the umbilical vein, a finding consistent with
cirrhosis.

Sludge in the gallbladder with borderline gallbladder wall
thickening. No gallstones seen. If there is concern for acalculous
cholecystitis, correlation with nuclear medicine hepatobiliary
imaging study to assess for cystic duct patency would be reasonable.

Portions of the pancreas are obscured by gas. Visualized portions of
pancreas appear normal.

No demonstrable ascites.

## 2016-06-09 IMAGING — DX DG CHEST 1V PORT
1 series · 1 of 1 positions shown · non-contrast
Comparison: 06/08/2014

CLINICAL DATA: Pneumonia. Evaluate recently placed support
apparatuses.

EXAM:
PORTABLE CHEST - 1 VIEW

[chest ap]
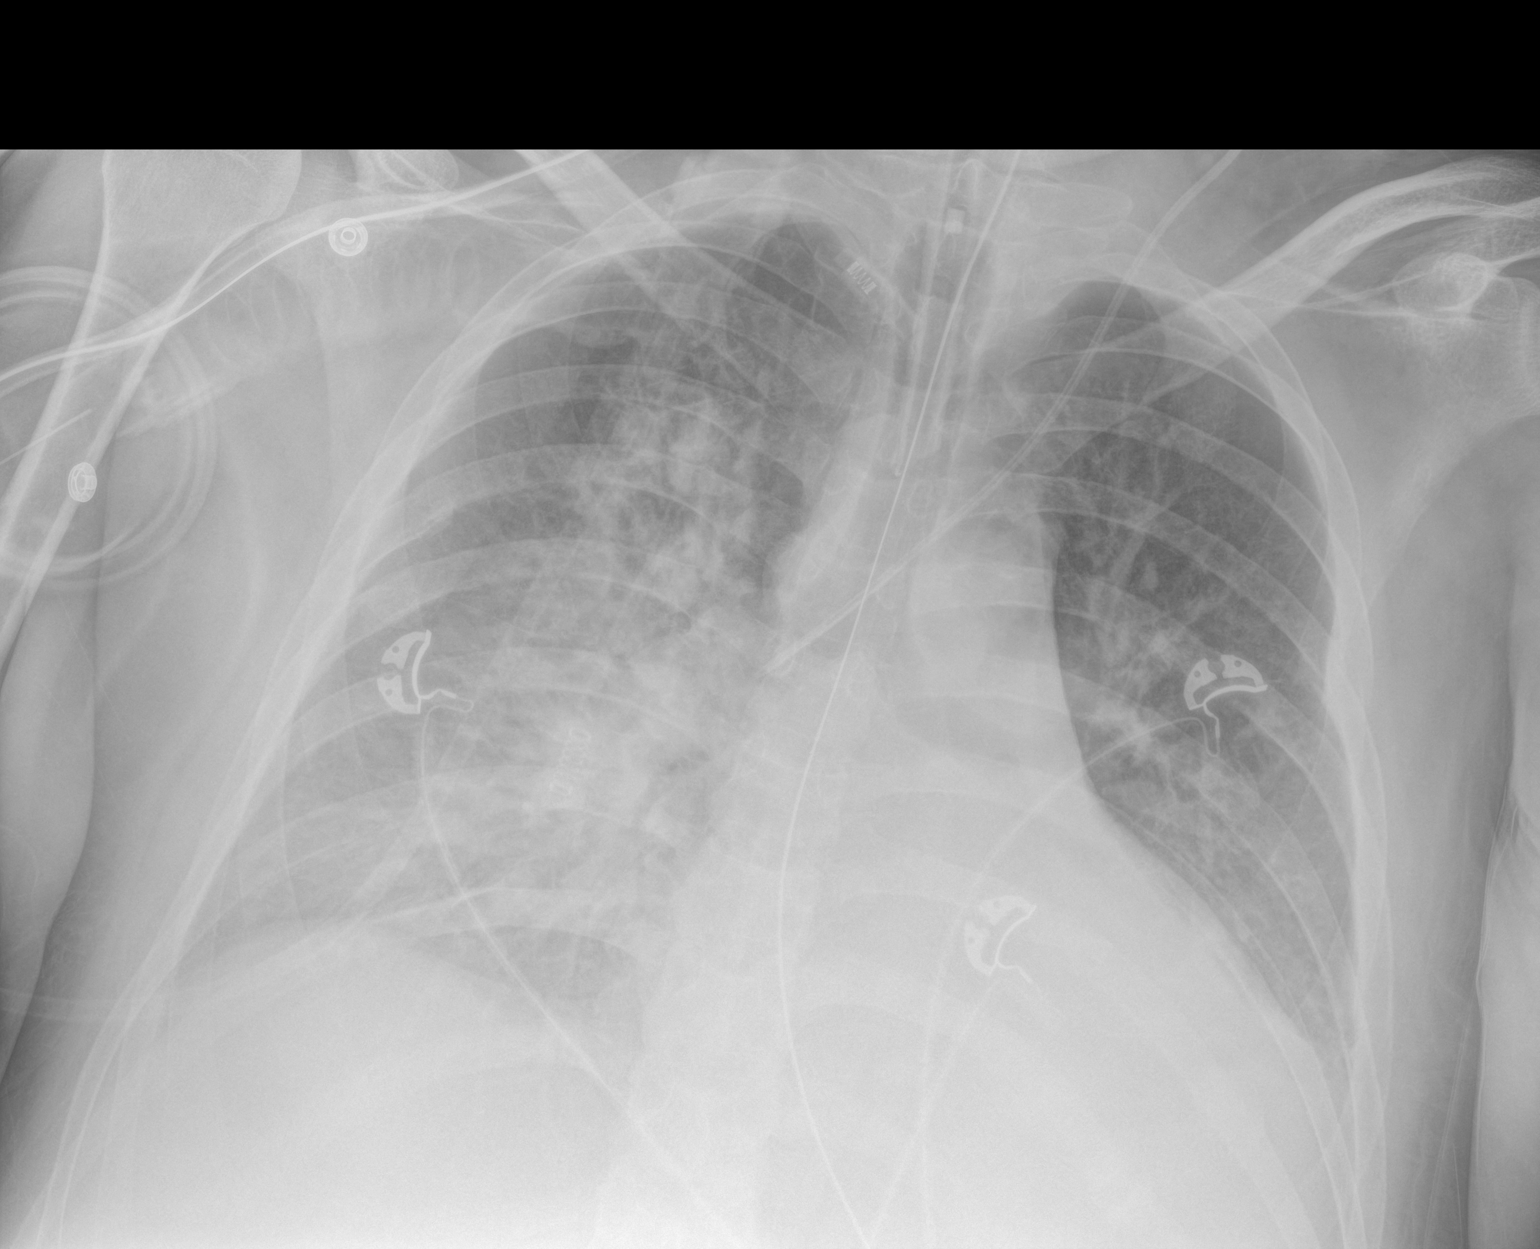

[1 of 1 positions shown; findings below may reference images not displayed]

FINDINGS: Endotracheal tube is 5.4 cm above the carina. Left jugular catheter
has been placed. The tip of the catheter is overlying the right
mainstem bronchus and appears to course below the azygos vein.
Location of this catheter tip is uncertain. This could be within the
upper SVC but cannot exclude arterial placement. No evidence for a
pneumothorax. Persistent airspace densities in the central aspect of
the right lung. Increased densities and consolidation at the left
lung base. Heart size is grossly stable.
IMPRESSION: Placement of left jugular central line as described. Catheter tip
could be in the upper SVC but cannot exclude arterial placement.
Findings were called to the patient's nurse at [DATE] p.m. on
06/08/2014.

Negative for a pneumothorax.  Endotracheal tube is well positioned.

Persistent airspace disease in the central aspect of the right lung
is compatible with history of pneumonia.

Increased densities at the left lung base.

## 2016-06-12 IMAGING — DX DG CHEST 1V PORT
1 series · 1 of 1 positions shown · non-contrast
Comparison: Portable chest x-ray of 06/10/2014

CLINICAL DATA: Pneumonia, followup

EXAM:
PORTABLE CHEST - 1 VIEW

[chest ap]
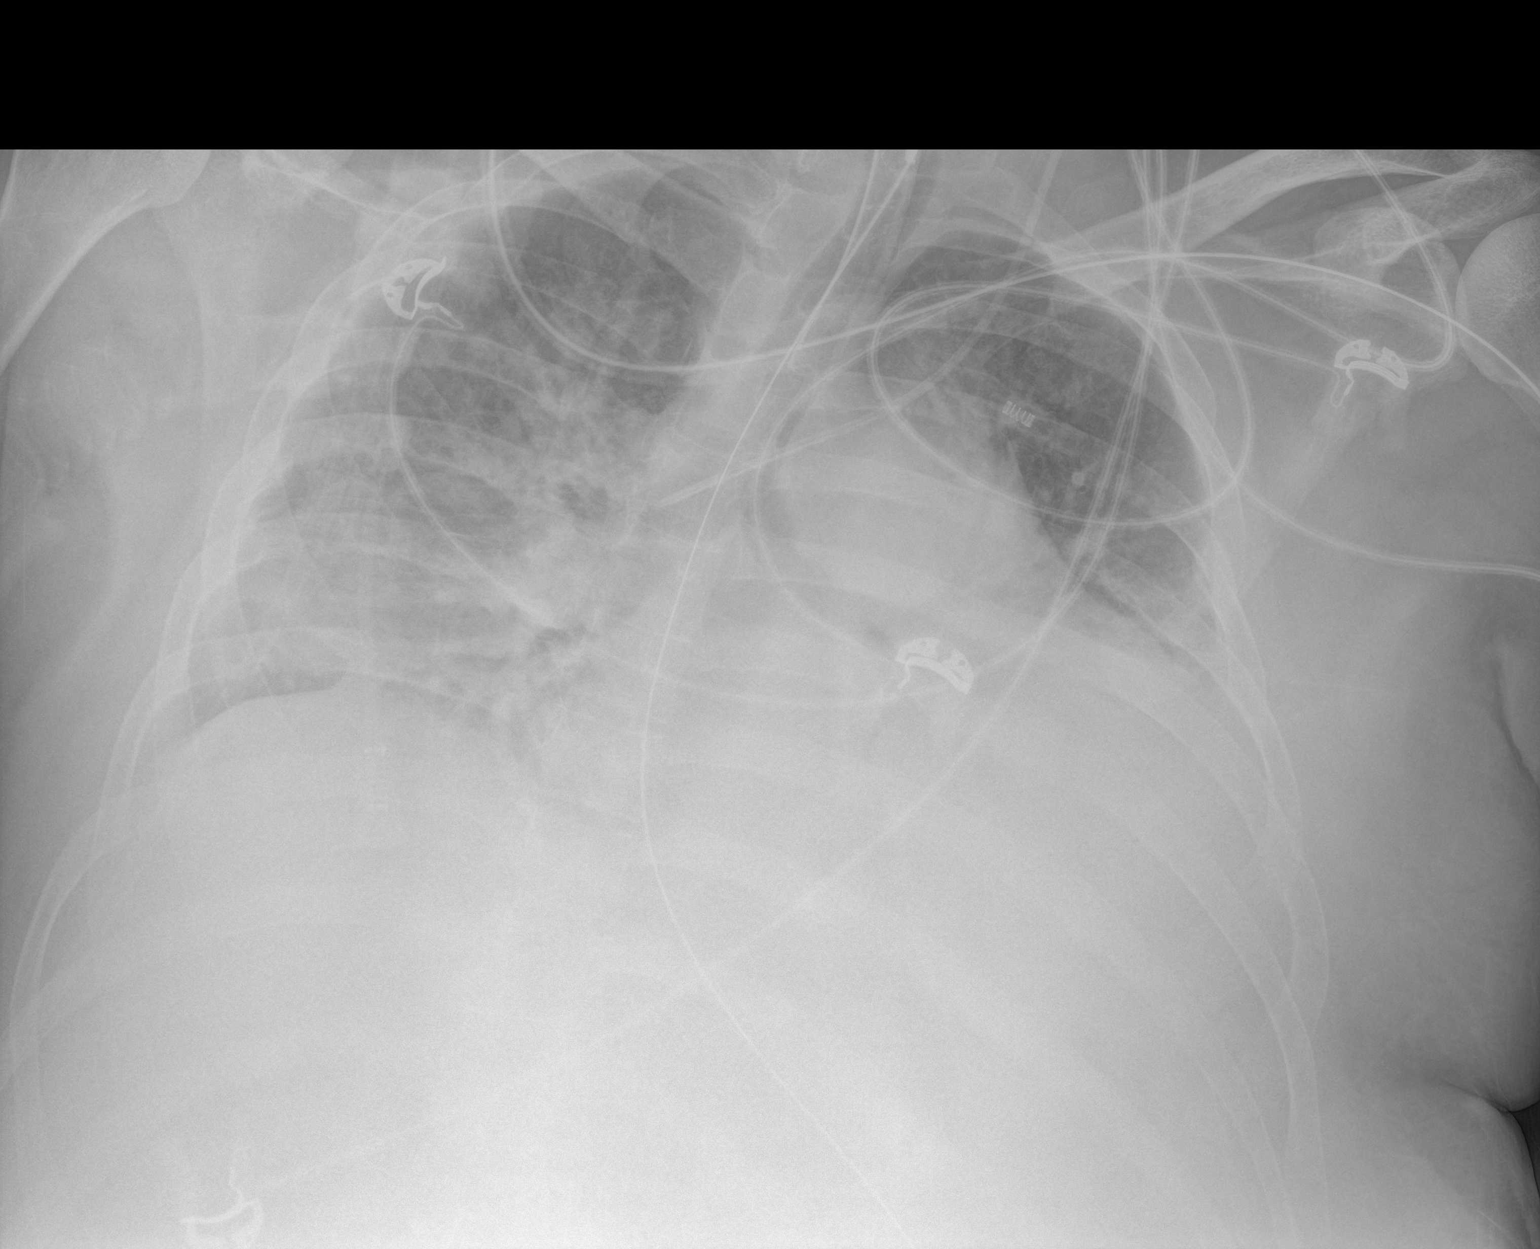

[1 of 1 positions shown; findings below may reference images not displayed]

FINDINGS: There has been interval change in the primarily perihilar
infiltrates noted yesterday. This may represent edema which is
improving. There do appear to be effusions left greater than right
with left basilar opacity consistent with atelectasis or pneumonia
as well. Cardiomegaly is stable. The tip of the endotracheal to is
approximately 4.3 cm above the carina. Left central venous line is
unchanged in position.
IMPRESSION: 1. Some improvement in perihilar airspace disease possibly
representing edema in view of the interval change.
2. Persistent left lower lobe opacity may represent atelectasis,
pneumonia, and possible effusion.

## 2016-06-13 IMAGING — CR DG CHEST 1V PORT
1 series · 1 of 1 positions shown · non-contrast
Comparison: 06/11/2014

CLINICAL DATA: Intubated patient and follow-up pneumonia. History
of hypertension and diabetes.

EXAM:
PORTABLE CHEST - 1 VIEW

[AP]
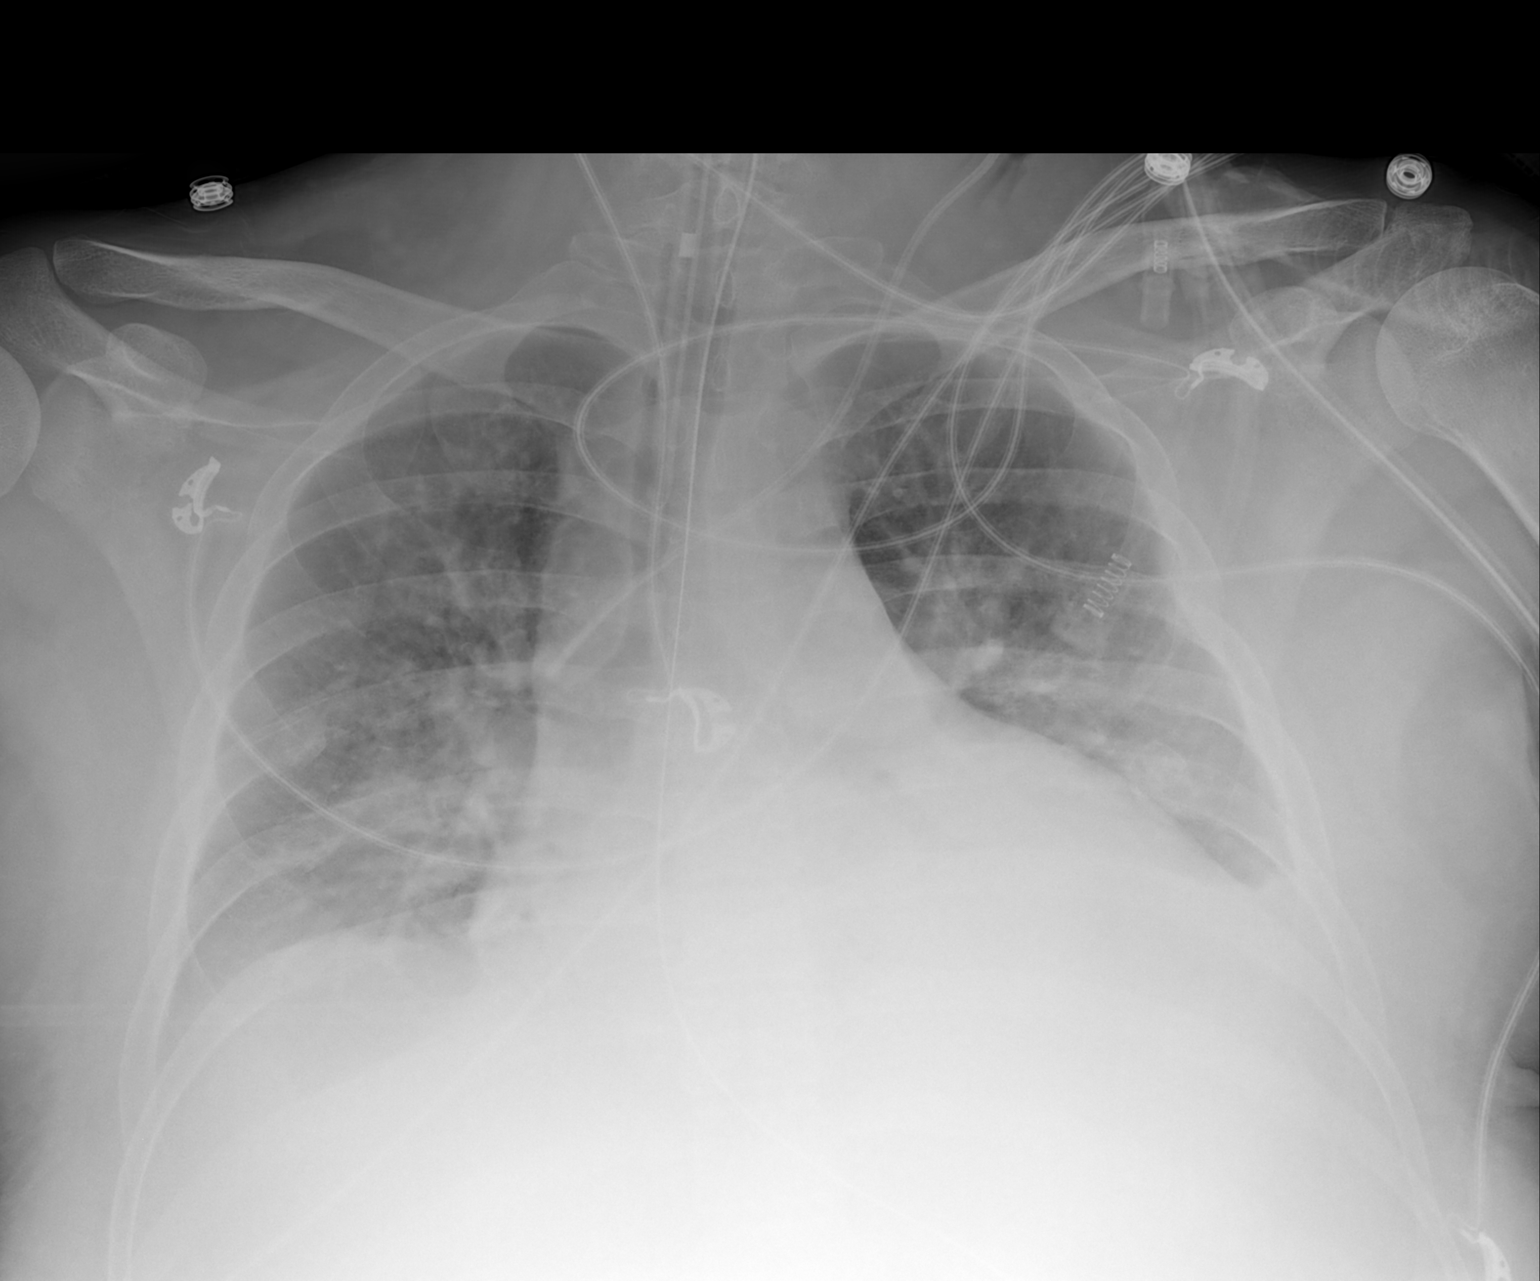

[1 of 1 positions shown; findings below may reference images not displayed]

FINDINGS: Endotracheal tube is 4.3 cm above the carina. Nasogastric tube
extends into the abdomen. Left jugular central line tip in the upper
SVC region. Persistent basilar densities suggest pleural fluid and
volume loss. Bilateral lung densities are suggestive for edema.
Negative for a pneumothorax. Overall, there is slightly improved
aeration of the lungs.
IMPRESSION: Improved aeration of the lungs may represent decreased pulmonary
edema.

Bibasilar densities suggest pleural fluid and
atelectasis/consolidation.

Support apparatuses as described.

## 2016-06-14 IMAGING — CR DG CHEST 1V PORT
1 series · 1 of 1 positions shown · non-contrast
Comparison: Radiograph 06/12/2014

CLINICAL DATA: Pneumonia, diabetes

EXAM:
PORTABLE CHEST - 1 VIEW

[AP]
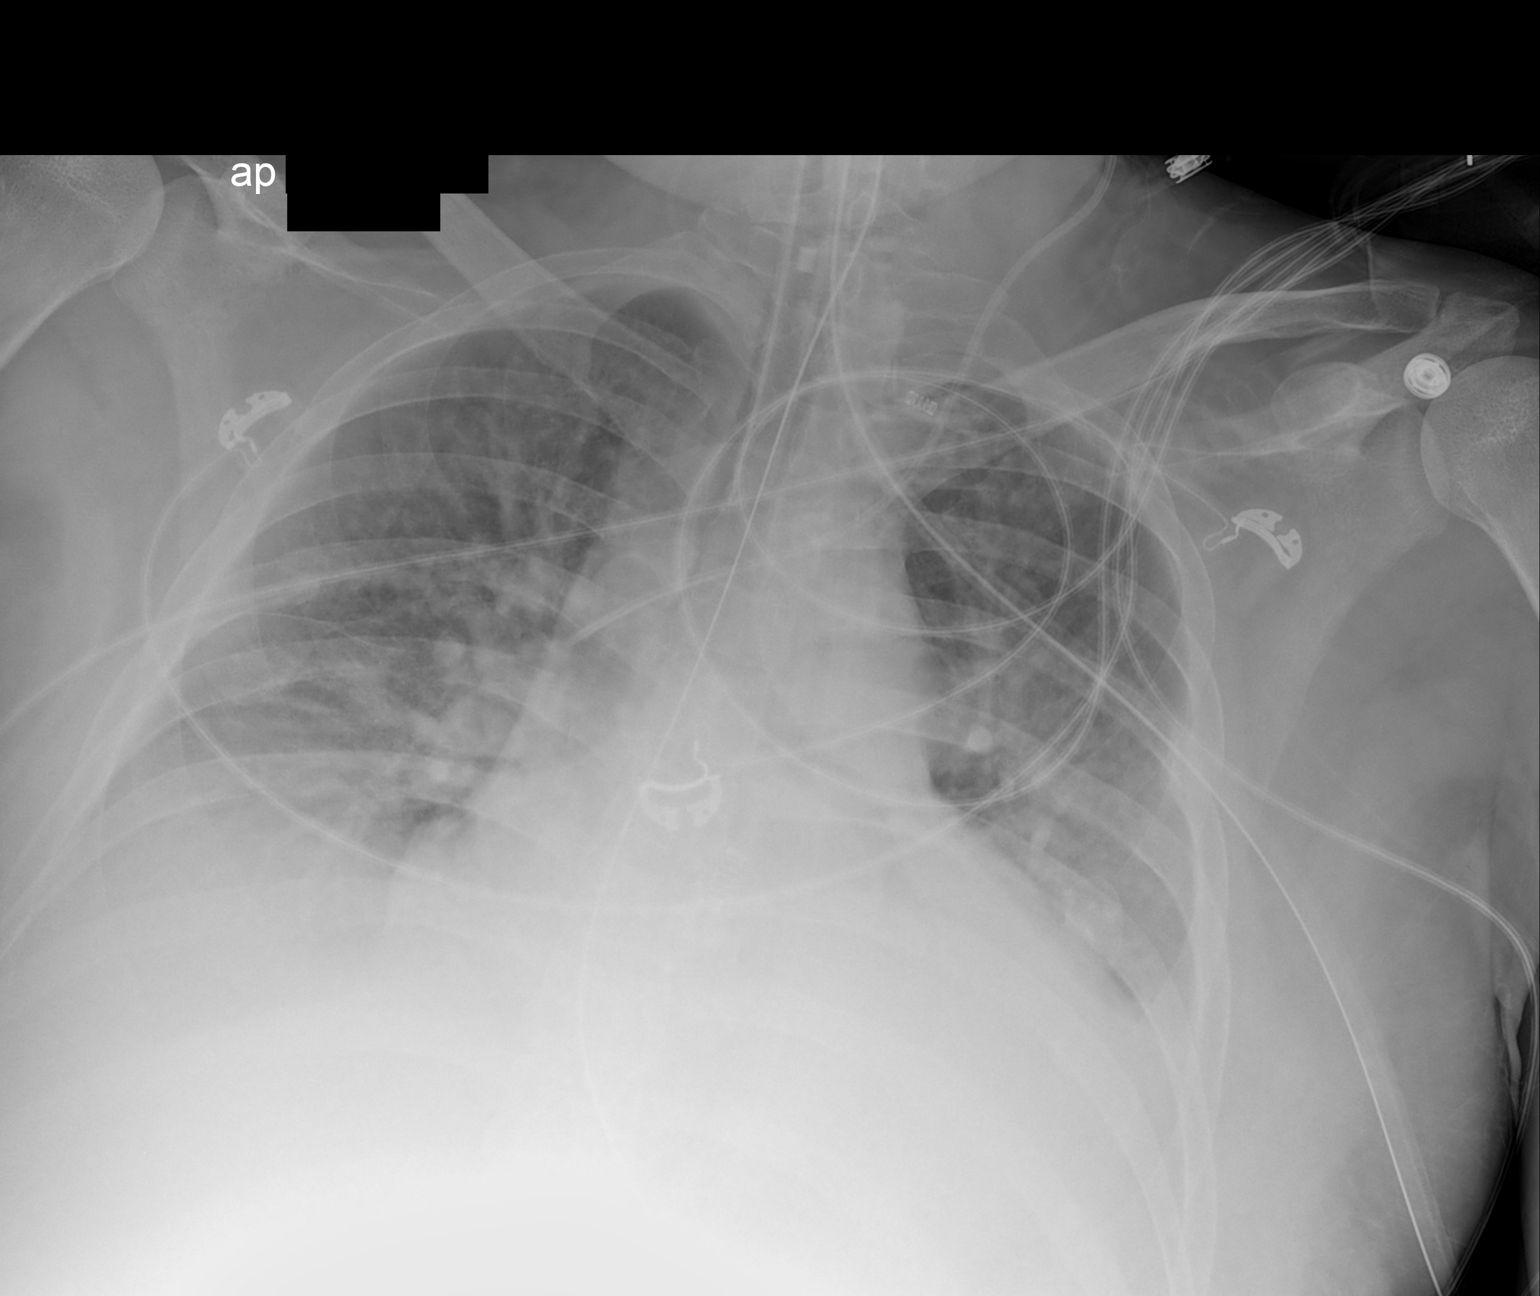

[1 of 1 positions shown; findings below may reference images not displayed]

FINDINGS: Endotracheal tube and NG tube are unchanged. Left central venous
line unchanged. Stable enlarged cardiac silhouette. Low lung volumes
a bilateral pleural effusions. Central venous congestion is slightly
increased compared to prior. No pneumothorax.
IMPRESSION: 1. Stable support apparatus.
2. Low lung volumes and bilateral pleural effusions unchanged.
3. Slight increase in central venous congestion.
